# Patient Record
Sex: Female | Born: 1965 | Race: Black or African American | Hispanic: No | Smoking: Never smoker
Health system: Southern US, Community
[De-identification: ages and names within clinical notes are randomized; demographics above are authoritative.]

## PROBLEM LIST (undated history)

## (undated) DIAGNOSIS — E7800 Pure hypercholesterolemia, unspecified: Secondary | ICD-10-CM

## (undated) HISTORY — PX: SHOULDER SURGERY: SHX246

## (undated) HISTORY — DX: Pure hypercholesterolemia, unspecified: E78.00

---

## 2004-11-11 ENCOUNTER — Emergency Department: Payer: Self-pay | Admitting: Emergency Medicine

## 2012-12-29 ENCOUNTER — Ambulatory Visit: Payer: Self-pay | Admitting: Family Medicine

## 2012-12-29 LAB — RAPID STREP-A WITH REFLX: Micro Text Report: NEGATIVE

## 2016-02-02 ENCOUNTER — Emergency Department
Admission: EM | Admit: 2016-02-02 | Discharge: 2016-02-02 | Disposition: A | Discharge status: Home / Self Care | Payer: Self-pay | Attending: Emergency Medicine | Admitting: Emergency Medicine

## 2016-02-02 ENCOUNTER — Encounter: Payer: Self-pay | Admitting: Emergency Medicine

## 2016-02-02 ENCOUNTER — Emergency Department: Payer: Self-pay

## 2016-02-02 DIAGNOSIS — S92511A Displaced fracture of proximal phalanx of right lesser toe(s), initial encounter for closed fracture: Secondary | ICD-10-CM | POA: Insufficient documentation

## 2016-02-02 DIAGNOSIS — Y999 Unspecified external cause status: Secondary | ICD-10-CM | POA: Insufficient documentation

## 2016-02-02 DIAGNOSIS — Z8781 Personal history of (healed) traumatic fracture: Secondary | ICD-10-CM

## 2016-02-02 DIAGNOSIS — W231XXA Caught, crushed, jammed, or pinched between stationary objects, initial encounter: Secondary | ICD-10-CM | POA: Insufficient documentation

## 2016-02-02 DIAGNOSIS — Y939 Activity, unspecified: Secondary | ICD-10-CM | POA: Insufficient documentation

## 2016-02-02 DIAGNOSIS — Y929 Unspecified place or not applicable: Secondary | ICD-10-CM | POA: Insufficient documentation

## 2016-02-02 MED ORDER — NAPROXEN 500 MG PO TABS: 500.0000 mg | ORAL_TABLET | Freq: Two times a day (BID) | ORAL | Status: DC

## 2016-02-02 MED ORDER — TRAMADOL HCL 50 MG PO TABS: 50.0000 mg | ORAL_TABLET | Freq: Four times a day (QID) | ORAL | 0 refills | Status: DC | PRN

## 2016-02-02 NOTE — ED Triage Notes (Signed)
Reports getting her toes stuck in a laundry basket 2 wks ago, pain in right foot since.  Ambulates well.

## 2016-02-02 NOTE — ED Notes (Signed)
See triage note  States she caught her toe in a clothes basket about 2 weeks ago   conts to have pain at right 3rd and 4th toes

## 2016-02-02 NOTE — Discharge Instructions (Signed)
keep toe buddy taped and wear open shoe until evaluation by podiatry.

## 2016-02-02 NOTE — ED Provider Notes (Signed)
[facility] Emergency Department Provider Note   ____________________________________________   First MD Initiated Contact with Patient 02/02/16 [phone]     (approximate)  I have reviewed the triage vital signs and the nursing notes.   HISTORY  Chief Complaint Foot Injury    HPI Brittney Yu is a 50 y.o. female patient complaining of pain and edema to the fourth digit right foot secondary to a contusion 2 weeks ago. Patient did not seek medical care on date of injury. Patient states she is concerned because of continued pain and edema. Patient state mild and transient relief with over-the-counter anti-inflammatory medications. Patient rates the pain as a 7/10. Patient described a pain as "achy".  History reviewed. No pertinent past medical history.  There are no active problems to display for this patient.   History reviewed. No pertinent surgical history.  Prior to Admission medications   Medication Sig Start Date End Date Taking? Authorizing Provider  naproxen (NAPROSYN) 500 MG tablet Take 1 tablet (500 mg total) by mouth 2 (two) times daily with a meal. 02/02/16   Joni Reiningonald K Smith, PA-C  traMADol (ULTRAM) 50 MG tablet Take 1 tablet (50 mg total) by mouth every 6 (six) hours as needed for moderate pain. 02/02/16   Joni Reiningonald K Smith, PA-C    Allergies Patient has no known allergies.  No family history on file.  Social History Social History  Substance Use Topics  . Smoking status: Never Smoker  . Smokeless tobacco: Never Used  . Alcohol use Not on file    Review of Systems Constitutional: No fever/chills Eyes: No visual changes. ENT: No sore throat. Cardiovascular: Denies chest pain. Respiratory: Denies shortness of breath. Gastrointestinal: No abdominal pain.  No nausea, no vomiting.  No diarrhea.  No constipation. Genitourinary: Negative for dysuria. Musculoskeletal: Pain and edema fourth digit right foot. Skin: Negative for  rash. Neurological: Negative for headaches, focal weakness or numbness.  .  ____________________________________________   PHYSICAL EXAM:  VITAL SIGNS: ED Triage Vitals [02/02/16 0836]  Enc Vitals Group     BP (!) 149/71     Pulse Rate 86     Resp 16     Temp 97.9 F (36.6 C)     Temp Source Oral     SpO2 98 %     Weight 200 lb (90.7 kg)     Height 5\' 9"  (1.753 m)     Head Circumference      Peak Flow      Pain Score 7     Pain Loc      Pain Edu?      Excl. in GC?     Constitutional: Alert and oriented. Well appearing and in no acute distress. Eyes: Conjunctivae are normal. PERRL. EOMI. Head: Atraumatic. Nose: No congestion/rhinnorhea. Mouth/Throat: Mucous membranes are moist.  Oropharynx non-erythematous. Neck: No stridor.  No cervical spine tenderness to palpation. Hematological/Lymphatic/Immunilogical: No cervical lymphadenopathy. Cardiovascular: Normal rate, regular rhythm. Grossly normal heart sounds.  Good peripheral circulation. Respiratory: Normal respiratory effort.  No retractions. Lungs CTAB. Gastrointestinal: Soft and nontender. No distention. No abdominal bruits. No CVA tenderness. Musculoskeletal:No obvious deformity to the fourth digit of the right foot. There is mild edema. Patient is moderate guarding palpation of the affected digit. Neurologic:  Normal speech and language. No gross focal neurologic deficits are appreciated. No gait instability. Skin:  Skin is warm, dry and intact. No rash noted. Psychiatric: Mood and affect are normal. Speech and behavior are normal.  ____________________________________________   LABS (all labs ordered are listed, but only abnormal results are displayed)  Labs Reviewed - No data to display ____________________________________________  EKG  ____________________________________________  RADIOLOGY  Fracture proximal phalange fourth digit right  foot. ____________________________________________   PROCEDURES  Procedure(s) performed: None  Procedures  Critical Care performed: No  ____________________________________________   INITIAL IMPRESSION / ASSESSMENT AND PLAN / ED COURSE  Pertinent labs & imaging results that were available during my care of the patient were reviewed by me and considered in my medical decision making (see chart for details).  Fractured toe right foot. Patient given discharge care instructions. Patient advised follow-up with podiatry for definitive evaluation and treatment.  Clinical Course   Discussed x-ray finding with patient. Patient toe was buddy taped place an open shoe. Patient will follow with podiatry.   ____________________________________________   FINAL CLINICAL IMPRESSION(S) / ED DIAGNOSES  Final diagnoses:  History of toe fracture      NEW MEDICATIONS STARTED DURING THIS VISIT:  New Prescriptions   NAPROXEN (NAPROSYN) 500 MG TABLET    Take 1 tablet (500 mg total) by mouth 2 (two) times daily with a meal.   TRAMADOL (ULTRAM) 50 MG TABLET    Take 1 tablet (50 mg total) by mouth every 6 (six) hours as needed for moderate pain.     Note:  This document was prepared using Dragon voice recognition software and may include unintentional dictation errors.    Joni ReiningRonald K Smith, PA-C 02/02/16 [id number]    Nita Sicklearolina Veronese, MD 02/07/16 2322

## 2016-08-29 ENCOUNTER — Ambulatory Visit
Admission: EM | Admit: 2016-08-29 | Discharge: 2016-08-29 | Disposition: A | Discharge status: Home / Self Care | Payer: Self-pay | Attending: Emergency Medicine | Admitting: Emergency Medicine

## 2016-08-29 ENCOUNTER — Encounter: Payer: Self-pay | Admitting: *Deleted

## 2016-08-29 DIAGNOSIS — J0100 Acute maxillary sinusitis, unspecified: Secondary | ICD-10-CM

## 2016-08-29 DIAGNOSIS — J0110 Acute frontal sinusitis, unspecified: Secondary | ICD-10-CM

## 2016-08-29 DIAGNOSIS — R059 Cough, unspecified: Secondary | ICD-10-CM

## 2016-08-29 DIAGNOSIS — R05 Cough: Secondary | ICD-10-CM

## 2016-08-29 MED ORDER — HYDROCOD POLST-CPM POLST ER 10-8 MG/5ML PO SUER: 5.0000 mL | Freq: Every evening | ORAL | 0 refills | Status: DC | PRN

## 2016-08-29 MED ORDER — BENZONATATE 100 MG PO CAPS: 100.0000 mg | ORAL_CAPSULE | Freq: Three times a day (TID) | ORAL | 0 refills | Status: DC | PRN

## 2016-08-29 MED ORDER — DOXYCYCLINE HYCLATE 100 MG PO CAPS: 100.0000 mg | ORAL_CAPSULE | Freq: Two times a day (BID) | ORAL | 0 refills | Status: DC

## 2016-08-29 NOTE — ED Triage Notes (Signed)
Patient started having symptoms of nasal congestion / drainage, left ear pain, and chest congestion 2 months ago. Patient has treated here symptoms with OTC medications with mixed symptom resolution.

## 2016-08-29 NOTE — Discharge Instructions (Signed)
Take medication as prescribed. Rest. Drink plenty of fluids.  ° °Follow up with your primary care physician this week as needed. Return to Urgent care for new or worsening concerns.  ° °

## 2016-08-29 NOTE — ED Provider Notes (Signed)
[facility] ____________________________________________  Time seen: Approximately 3:19 PM  I have reviewed the triage vital signs and the nursing notes.   HISTORY  Chief Complaint Nasal Congestion and Otalgia   HPI Brittney Yu is a 51 y.o. female presenting for evaluation of runny nose, nasal congestion, sinus pressure and intermittent cough that is been present intermittently for the last 2 months. Patient states at times she starts to improve and then symptoms returned back. Reports symptoms have never fully resolved. States she does get some similar symptoms every year around this time frame, question seasonal allergies. Reports symptoms have been unresolved with over-the-counter cough, congestion medications. Denies any Fevers.  Reports cough is daytime and nighttime. States cough does intermittently disrupt her sleep. Denies any wheezing. Reports she has been able to get some thick greenish nasal drainage from nose, but has felt clogged recently. States cough is a dry nonproductive cough. States sinus is still clogged with pressure discomfort to forehead as well as cheeks described as mild to moderate. Denies aggravating or alleviating factors. Denies sore throat. Denies sick contacts. Reports continues to eat and drink well. States sometimes she feels some soreness around her ribs and chest from coughing that is reproducible with direct palpation, but denies chest pain. States no pain unless direct palpated or with movement.  Denies abdominal pain, dysuria, extremity pain, extremity swelling or rash. Denies recent sickness. Denies recent antibiotic use.   Patient's last menstrual period was 08/27/2016. perimenopausal. Denies pregnancy.    History reviewed. No pertinent past medical history. denies There are no active problems to display for this patient.   History reviewed. No pertinent surgical history.   No current facility-administered medications for this  encounter.   Current Outpatient Prescriptions:  none  Allergies Patient has no known allergies.  History reviewed. No pertinent family history.  Social History Social History  Substance Use Topics  . Smoking status: Never Smoker  . Smokeless tobacco: Never Used  . Alcohol use No    Review of Systems Constitutional: No fever/chills Eyes: No visual changes. ENT: No sore throat. As above.  Cardiovascular: Denies chest pain. Respiratory: Denies shortness of breath. Gastrointestinal: No abdominal pain.   Musculoskeletal: Negative for back pain. Skin: Negative for rash.   ____________________________________________   PHYSICAL EXAM:  VITAL SIGNS: ED Triage Vitals  Enc Vitals Group     BP 08/29/16 1418 (!) 127/53     Pulse Rate 08/29/16 1418 65     Resp 08/29/16 1418 16     Temp 08/29/16 1418 98.1 F (36.7 C)     Temp Source 08/29/16 1418 Oral     SpO2 08/29/16 1418 100 %     Weight 08/29/16 1419 200 lb (90.7 kg)     Height 08/29/16 1419 5\' 9"  (1.753 m)     Head Circumference --      Peak Flow --      Pain Score 08/29/16 1420 5     Pain Loc --      Pain Edu? --      Excl. in GC? --    Constitutional: Alert and oriented. Well appearing and in no acute distress. Eyes: Conjunctivae are normal. PERRL. EOMI. Head: Atraumatic.Mild to moderate tenderness to palpation bilateral frontal and maxillary sinuses. No swelling. No erythema.   Ears: no erythema, normal TMs bilaterally.   Nose: nasal congestion with bilateral nasal turbinate erythema and edema.   Mouth/Throat: Mucous membranes are moist.  Oropharynx non-erythematous.No tonsillar swelling or exudate.Drainage present in  posterior pharynx.  Neck: No stridor.  No cervical spine tenderness to palpation. Hematological/Lymphatic/Immunilogical: No cervical lymphadenopathy. Cardiovascular: Normal rate, regular rhythm. Grossly normal heart sounds.  Good peripheral circulation. Respiratory: Normal respiratory effort.  No  retractions.  No wheezes, rales or rhonchi. Good air movement. Occasional dry cough noted. Speaks in complete sentences.  Musculoskeletal:  No cervical, thoracic or lumbar tenderness to palpation. Mild tenderness to palpation along bilateral anterior chest and lateral bilateral ribs, no ecchymosis, no palpable rib fracture, per patient pain is fully reproduced by direct palpation. Neurologic:  Normal speech and language.No gait instability. Skin:  Skin is warm, dry. Psychiatric: Mood and affect are normal. Speech and behavior are normal.   ___________________________________________   LABS (all labs ordered are listed, but only abnormal results are displayed)  Labs Reviewed - No data to display   PROCEDURES Procedures    INITIAL IMPRESSION / ASSESSMENT AND PLAN / ED COURSE  Pertinent labs & imaging results that were available during my care of the patient were reviewed by me and considered in my medical decision making (see chart for details).  Well-appearing patient. No acute distress. Suspect sinusitis with postnasal drainage. Lungs clear throughout, will defer chest x-ray, patient agrees. Will treat patient with oral doxycycline, when necessary Tessalon Perles, when necessary Tussionex. Discussed with patient use of over-the-counter Claritin or Zyrtec. Encourage rest, fluids and supportive care.  Discussed follow up with Primary care physician this week. Discussed follow up and return parameters including no resolution or any worsening concerns. Patient verbalized understanding and agreed to plan.   ____________________________________________   FINAL CLINICAL IMPRESSION(S) / ED DIAGNOSES  Final diagnoses:  Acute frontal sinusitis, recurrence not specified  Acute maxillary sinusitis, recurrence not specified  Cough     Discharge Medication List as of 08/29/2016  3:29 PM    START taking these medications   Details  benzonatate (TESSALON PERLES) 100 MG capsule Take 1  capsule (100 mg total) by mouth 3 (three) times daily as needed for cough., Starting Mon 08/29/2016, Normal    chlorpheniramine-HYDROcodone (TUSSIONEX PENNKINETIC ER) 10-8 MG/5ML SUER Take 5 mLs by mouth at bedtime as needed for cough. do not drive or operate machinery while taking as can cause drowsiness., Starting Mon 08/29/2016, Print    doxycycline (VIBRAMYCIN) 100 MG capsule Take 1 capsule (100 mg total) by mouth 2 (two) times daily., Starting Mon 08/29/2016, Normal        Note: This dictation was prepared with Dragon dictation along with smaller phrase technology. Any transcriptional errors that result from this process are unintentional.         Renford DillsMiller, Ahliya Glatt, NP 08/29/16 1700

## 2017-03-16 ENCOUNTER — Other Ambulatory Visit: Payer: Self-pay

## 2017-03-16 ENCOUNTER — Ambulatory Visit
Admission: EM | Admit: 2017-03-16 | Discharge: 2017-03-16 | Disposition: A | Discharge status: Home / Self Care | Payer: Self-pay | Attending: Family Medicine | Admitting: Family Medicine

## 2017-03-16 ENCOUNTER — Ambulatory Visit (INDEPENDENT_AMBULATORY_CARE_PROVIDER_SITE_OTHER): Payer: Self-pay

## 2017-03-16 ENCOUNTER — Encounter: Payer: Self-pay | Admitting: *Deleted

## 2017-03-16 DIAGNOSIS — R112 Nausea with vomiting, unspecified: Secondary | ICD-10-CM

## 2017-03-16 DIAGNOSIS — J189 Pneumonia, unspecified organism: Secondary | ICD-10-CM

## 2017-03-16 DIAGNOSIS — R0602 Shortness of breath: Secondary | ICD-10-CM

## 2017-03-16 MED ORDER — ONDANSETRON 8 MG PO TBDP: 8.0000 mg | ORAL_TABLET | Freq: Two times a day (BID) | ORAL | 0 refills | Status: DC

## 2017-03-16 MED ORDER — LEVOFLOXACIN 500 MG PO TABS: 500.0000 mg | ORAL_TABLET | Freq: Every day | ORAL | 0 refills | Status: DC

## 2017-03-16 MED ORDER — HYDROCOD POLST-CPM POLST ER 10-8 MG/5ML PO SUER: 5.0000 mL | Freq: Two times a day (BID) | ORAL | 0 refills | Status: DC

## 2017-03-16 NOTE — Discharge Instructions (Signed)
If you worsen go immediately to the emergency room.  You need to establish a primary care physician.  [facility].  You need a follow-up chest x-ray in 3-4 weeks for proof of cure and also to evaluate a nodule on your x-ray.

## 2017-03-16 NOTE — ED Provider Notes (Signed)
[facility]    CSN: [id number] Arrival date & time: 03/16/17  1326     History   Chief Complaint Chief Complaint  Patient presents with  . Otalgia  . Cough    HPI Brittney Yu is a 52 y.o. female.   HPI  52 year old female who presents with symptoms of cough headache bilateral ear pain and chills that started 4 days ago.  He has had nausea vomiting and diarrhea she has not been eating due to the nausea and vomiting and has been only marginally hydrating from her description.  Is coughing up clear  to white mucus.  She states that she has been very fatigued does not remember Monday since she spent all day in bed.  Not improving.       History reviewed. No pertinent past medical history.  There are no active problems to display for this patient.   History reviewed. No pertinent surgical history.  OB History    No data available       Home Medications    Prior to Admission medications   Medication Sig Start Date End Date Taking? Authorizing Provider  chlorpheniramine-HYDROcodone (TUSSIONEX PENNKINETIC ER) 10-8 MG/5ML SUER Take 5 mLs by mouth 2 (two) times daily. 03/16/17   Lutricia Feiloemer, Evanell Redlich P, PA-C  levofloxacin (LEVAQUIN) 500 MG tablet Take 1 tablet (500 mg total) by mouth daily. 03/16/17   Lutricia Feiloemer, Azael Ragain P, PA-C  ondansetron (ZOFRAN ODT) 8 MG disintegrating tablet Take 1 tablet (8 mg total) by mouth 2 (two) times daily. 03/16/17   Lutricia Feiloemer, Alyviah Crandle P, PA-C    Family History History reviewed. No pertinent family history.  Social History Social History   Tobacco Use  . Smoking status: Never Smoker  . Smokeless tobacco: Never Used  Substance Use Topics  . Alcohol use: No  . Drug use: No     Allergies   Patient has no known allergies.   Review of Systems Review of Systems  Constitutional: Positive for activity change, chills and fatigue. Negative for fever.  HENT: Positive for congestion.   Respiratory: Positive for cough and shortness of  breath.   Gastrointestinal: Positive for diarrhea, nausea and vomiting.  All other systems reviewed and are negative.    Physical Exam Triage Vital Signs ED Triage Vitals  Enc Vitals Group     BP 03/16/17 1403 124/67     Pulse Rate 03/16/17 1403 80     Resp 03/16/17 1403 16     Temp 03/16/17 1403 98.6 F (37 C)     Temp Source 03/16/17 1403 Oral     SpO2 03/16/17 1403 95 %     Weight 03/16/17 1405 220 lb (99.8 kg)     Height 03/16/17 1405 5\' 9"  (1.753 m)     Head Circumference --      Peak Flow --      Pain Score 03/16/17 1405 0     Pain Loc --      Pain Edu? --      Excl. in GC? --    No data found.  Updated Vital Signs BP 124/67 (BP Location: Right Arm)   Pulse 80   Temp 98.6 F (37 C) (Oral)   Resp 16   Ht 5\' 9"  (1.753 m)   Wt 220 lb (99.8 kg)   LMP 12/28/2016   SpO2 95%   BMI 32.49 kg/m   Visual Acuity Right Eye Distance:   Left Eye Distance:   Bilateral Distance:  Right Eye Near:   Left Eye Near:    Bilateral Near:     Physical Exam  Constitutional: She is oriented to person, place, and time. She appears well-developed and well-nourished. No distress.  HENT:  Head: Normocephalic.  Right Ear: External ear normal.  Left Ear: External ear normal.  Nose: Nose normal.  Mouth/Throat: Oropharynx is clear and moist. No oropharyngeal exudate.  Eyes: Pupils are equal, round, and reactive to light. Right eye exhibits no discharge. Left eye exhibits no discharge.  Neck: Normal range of motion.  Pulmonary/Chest: Effort normal. She has rales.  He exhibits bilateral lower crackles  Musculoskeletal: Normal range of motion.  Lymphadenopathy:    She has no cervical adenopathy.  Neurological: She is alert and oriented to person, place, and time.  Skin: Skin is warm and dry. She is not diaphoretic.  Psychiatric: She has a normal mood and affect. Her behavior is normal. Judgment and thought content normal.  Nursing note and vitals reviewed.    UC Treatments /  Results  Labs (all labs ordered are listed, but only abnormal results are displayed) Labs Reviewed - No data to display  EKG  EKG Interpretation None       Radiology Dg Chest 2 View  Result Date: 03/16/2017 CLINICAL DATA:  Cough.  Chills. EXAM: CHEST  2 VIEW COMPARISON:  No recent prior. FINDINGS: Mediastinum and hilar structures normal. Mild bibasilar atelectasis and infiltrates. Follow-up chest x-ray to demonstrate clearing suggested . Pulmonary nodule noted projected over the right upper lobe. If this nodular density persists on follow-up chest x-rays chest CT suggested. IMPRESSION: 1. Mild bibasilar atelectasis and infiltrates. Follow-up chest x-rays to demonstrate clearing suggested. 2. Pulmonary nodular density noted projected right upper lung. If this nodular nodular density persists on follow-up chest x-ray is, chest CT suggested . Electronically Signed   By: Maisie Fus  Register   On: 03/16/2017 15:30    Procedures Procedures (including critical care time)  Medications Ordered in UC Medications - No data to display   Initial Impression / Assessment and Plan / UC Course  I have reviewed the triage vital signs and the nursing notes.  Pertinent labs & imaging results that were available during my care of the patient were reviewed by me and considered in my medical decision making (see chart for details).     Plan: 1. Test/x-ray results and diagnosis reviewed with patient 2. rx as per orders; risks, benefits, potential side effects reviewed with patient 3. Recommend supportive treatment with rest and fluids.  Zofran for nausea.  As we discussed you need to follow-up in 3-4 weeks with another x-ray to proof of cure as well as to reevaluate the nodule found in your right lobe. If You worsen you need to go to the emergency room for further evaluation.  Recommend following up at [facility] they can provide primary care medicine to you. 4. F/u prn if symptoms worsen or don't  improve   Final Clinical Impressions(s) / UC Diagnoses   Final diagnoses:  Community acquired pneumonia, unspecified laterality    ED Discharge Orders        Ordered    levofloxacin (LEVAQUIN) 500 MG tablet  Daily     03/16/17 1547    chlorpheniramine-HYDROcodone (TUSSIONEX PENNKINETIC ER) 10-8 MG/5ML SUER  2 times daily     03/16/17 1547    ondansetron (ZOFRAN ODT) 8 MG disintegrating tablet  2 times daily     03/16/17 1555  Controlled Substance Prescriptions [place] Controlled Substance Registry consulted? Not Applicable   Lutricia Feil, PA-C 03/16/17 2102

## 2017-03-16 NOTE — ED Triage Notes (Signed)
Patient started having symptoms of cough, headache, bilateral ear pain and chills 4 days ago.

## 2017-06-19 ENCOUNTER — Ambulatory Visit: Payer: Self-pay | Attending: Oncology | Admitting: *Deleted

## 2017-06-19 ENCOUNTER — Ambulatory Visit
Admission: RE | Admit: 2017-06-19 | Discharge: 2017-06-19 | Disposition: A | Discharge status: Home / Self Care | Payer: Self-pay | Source: Ambulatory Visit | Attending: Oncology | Admitting: Oncology

## 2017-06-19 VITALS — BP 130/78 | HR 71 | Temp 98.5°F | Ht 69.0 in | Wt 237.0 lb

## 2017-06-19 DIAGNOSIS — Z0000 Encounter for general adult medical examination without abnormal findings: Secondary | ICD-10-CM

## 2017-06-19 NOTE — Progress Notes (Signed)
Subjective:     Patient ID: Brittney Yu, female   DOB: 06/22/1965, 52 y.o.   MRN: [id number]  HPI   Review of Systems     Objective:   Physical Exam  Pulmonary/Chest: Right breast exhibits no inverted nipple, no mass, no nipple discharge, no skin change and no tenderness. Left breast exhibits no inverted nipple, no mass, no nipple discharge, no skin change and no tenderness. Breasts are symmetrical.  Very large pendulous breast       Assessment:     52 year old Black female referred to [facility] by [facility] for clinical breast exam and mammogram only.  Last pap on 05/15/17 was negative without HPV co-testing.  Next pap due in 2022.  Patient's dad was diagnosed with breast cancer in his 6070's.  States he did not get genetic testing, but her brother did, and was negative.  States no other family members with cancer.  [facility] gives the patient a lifetime risk of 13.5%, but the model does not take into consideration that her dad had cancer.  Reviewed need for annual screening.  She is agreeable.  Clinical breast exam unremarkable.  Taught self breast awareness.  Patient has been screened for eligibility.  She does not have any insurance, Medicare or Medicaid.  She also meets financial eligibility.  Hand-out given on the Affordable Care Act.    Plan:     Screening mammogram ordered.  Will follow-up per [facility] protocol.

## 2017-06-19 NOTE — Patient Instructions (Signed)
Gave patient hand-out, Women Staying Healthy, Active and Well from [facility], with education on breast health, pap smears, heart and colon health. 

## 2017-06-28 ENCOUNTER — Encounter: Payer: Self-pay | Admitting: *Deleted

## 2017-06-28 NOTE — Progress Notes (Signed)
Letter mailed from the [facility] to inform patient of her normal mammogram results.  Patient is to follow-up with annual screening in one year.  HSIS to Christy. 

## 2018-04-08 ENCOUNTER — Other Ambulatory Visit: Payer: Self-pay

## 2018-04-08 ENCOUNTER — Encounter: Payer: Self-pay | Admitting: Gynecology

## 2018-04-08 ENCOUNTER — Ambulatory Visit
Admission: EM | Admit: 2018-04-08 | Discharge: 2018-04-08 | Disposition: A | Discharge status: Home / Self Care | Payer: Self-pay | Attending: Family Medicine | Admitting: Family Medicine

## 2018-04-08 DIAGNOSIS — J069 Acute upper respiratory infection, unspecified: Secondary | ICD-10-CM | POA: Insufficient documentation

## 2018-04-08 MED ORDER — HYDROCOD POLST-CPM POLST ER 10-8 MG/5ML PO SUER: 5.0000 mL | Freq: Two times a day (BID) | ORAL | 0 refills | Status: DC

## 2018-04-08 MED ORDER — BENZONATATE 200 MG PO CAPS: ORAL_CAPSULE | ORAL | 0 refills | Status: DC

## 2018-04-08 MED ORDER — FLUTICASONE PROPIONATE 50 MCG/ACT NA SUSP: 2.0000 | Freq: Every day | NASAL | 0 refills | Status: DC

## 2018-04-08 NOTE — ED Provider Notes (Signed)
[facility]    CSN: [id number] Arrival date & time: 04/08/18  0915     History   Chief Complaint Chief Complaint  Patient presents with  . Cough    HPI Brittney L Yu is a 53 y.o. female.   HPI  -year-old female presents with a cough that she has had for 5 days.  The cough is been keeping her up at night.  Does have mild nasal congestion but not significant pain.  Over-the-counter medications.  She received her flu shot toward the end of last year.  She has had no fever or chills.         History reviewed. No pertinent past medical history.  There are no active problems to display for this patient.   Past Surgical History:  Procedure Laterality Date  . SHOULDER SURGERY Left     OB History   No obstetric history on file.      Home Medications    Prior to Admission medications   Medication Sig Start Date End Date Taking? Authorizing Provider  benzonatate (TESSALON) 200 MG capsule Take one cap TID PRN cough 04/08/18   Lutricia Feil, PA-C  chlorpheniramine-HYDROcodone [facility]) 10-8 MG/5ML SUER Take 5 mLs by mouth 2 (two) times daily. 04/08/18   Lutricia Feil, PA-C  fluticasone (FLONASE) 50 MCG/ACT nasal spray Place 2 sprays into both nostrils daily. 04/08/18   Lutricia Feil, PA-C    Family History Family History  Problem Relation Age of Onset  . Hypertension Mother   . Breast cancer Father     Social History Social History   Tobacco Use  . Smoking status: Never Smoker  . Smokeless tobacco: Never Used  Substance Use Topics  . Alcohol use: No  . Drug use: No     Allergies   Patient has no known allergies.   Review of Systems Review of Systems  Constitutional: Positive for activity change. Negative for chills, fatigue and fever.  HENT: Positive for congestion.   Respiratory: Positive for cough.   All other systems reviewed and are negative.    Physical Exam Triage Vital Signs ED Triage Vitals    Enc Vitals Group     BP 04/08/18 0927 140/67     Pulse Rate 04/08/18 0927 80     Resp 04/08/18 0927 18     Temp 04/08/18 0927 98.9 F (37.2 C)     Temp Source 04/08/18 0927 Oral     SpO2 04/08/18 0927 100 %     Weight 04/08/18 0928 242 lb (109.8 kg)     Height 04/08/18 0928 5\' 9"  (1.753 m)     Head Circumference --      Peak Flow --      Pain Score 04/08/18 0926 6     Pain Loc --      Pain Edu? --      Excl. in GC? --    No data found.  Updated Vital Signs BP 140/67 (BP Location: Left Arm)   Pulse 80   Temp 98.9 F (37.2 C) (Oral)   Resp 18   Ht 5\' 9"  (1.753 m)   Wt 242 lb (109.8 kg)   SpO2 100%   BMI 35.74 kg/m   Visual Acuity Right Eye Distance:   Left Eye Distance:   Bilateral Distance:    Right Eye Near:   Left Eye Near:    Bilateral Near:     Physical Exam Vitals signs and nursing note  reviewed.  Constitutional:      General: She is not in acute distress.    Appearance: Normal appearance. She is not ill-appearing, toxic-appearing or diaphoretic.  HENT:     Head: Normocephalic.     Right Ear: Tympanic membrane and ear canal normal.     Left Ear: Tympanic membrane and ear canal normal.     Nose: Nose normal.     Mouth/Throat:     Mouth: Mucous membranes are moist.     Pharynx: No oropharyngeal exudate or posterior oropharyngeal erythema.  Eyes:     General:        Right eye: No discharge.        Left eye: No discharge.     Conjunctiva/sclera: Conjunctivae normal.  Neck:     Musculoskeletal: Normal range of motion and neck supple.  Pulmonary:     Effort: Pulmonary effort is normal.     Breath sounds: Normal breath sounds.  Musculoskeletal: Normal range of motion.  Skin:    General: Skin is warm and dry.  Neurological:     General: No focal deficit present.     Mental Status: She is alert and oriented to person, place, and time.  Psychiatric:        Mood and Affect: Mood normal.        Behavior: Behavior normal.        Thought Content:  Thought content normal.        Judgment: Judgment normal.      UC Treatments / Results  Labs (all labs ordered are listed, but only abnormal results are displayed) Labs Reviewed - No data to display  EKG None  Radiology No results found.  Procedures Procedures (including critical care time)  Medications Ordered in UC Medications - No data to display  Initial Impression / Assessment and Plan / UC Course  I have reviewed the triage vital signs and the nursing notes.  Pertinent labs & imaging results that were available during my care of the patient were reviewed by me and considered in my medical decision making (see chart for details).   Has a upper respiratory infection.  We will treat her with cough suppressants and have advised Flonase nasal spray.  If she is not improving she should follow-up with her primary care physician.  Final Clinical Impressions(s) / UC Diagnoses   Final diagnoses:  Upper respiratory tract infection, unspecified type   Discharge Instructions   None    ED Prescriptions    Medication Sig Dispense Auth. Provider   benzonatate (TESSALON) 200 MG capsule Take one cap TID PRN cough 30 capsule Lutricia Feil, PA-C   chlorpheniramine-HYDROcodone (TUSSIONEX PENNKINETIC ER) 10-8 MG/5ML SUER Take 5 mLs by mouth 2 (two) times daily. 115 mL Ovid Curd P, PA-C   fluticasone (FLONASE) 50 MCG/ACT nasal spray Place 2 sprays into both nostrils daily. 16 g Lutricia Feil, PA-C     Controlled Substance Prescriptions [place] Controlled Substance Registry consulted? Not Applicable   Lutricia Feil, PA-C 04/08/18 1010

## 2018-04-08 NOTE — ED Triage Notes (Signed)
Per patient with cough x 5 days. Pt. Stated cough keep her up at night.

## 2018-12-06 IMAGING — CR DG CHEST 2V
2 series · 2 of 2 positions shown · non-contrast
Comparison: No recent prior.

CLINICAL DATA: Cough.  Chills.

EXAM:
CHEST  2 VIEW

[chest pa]
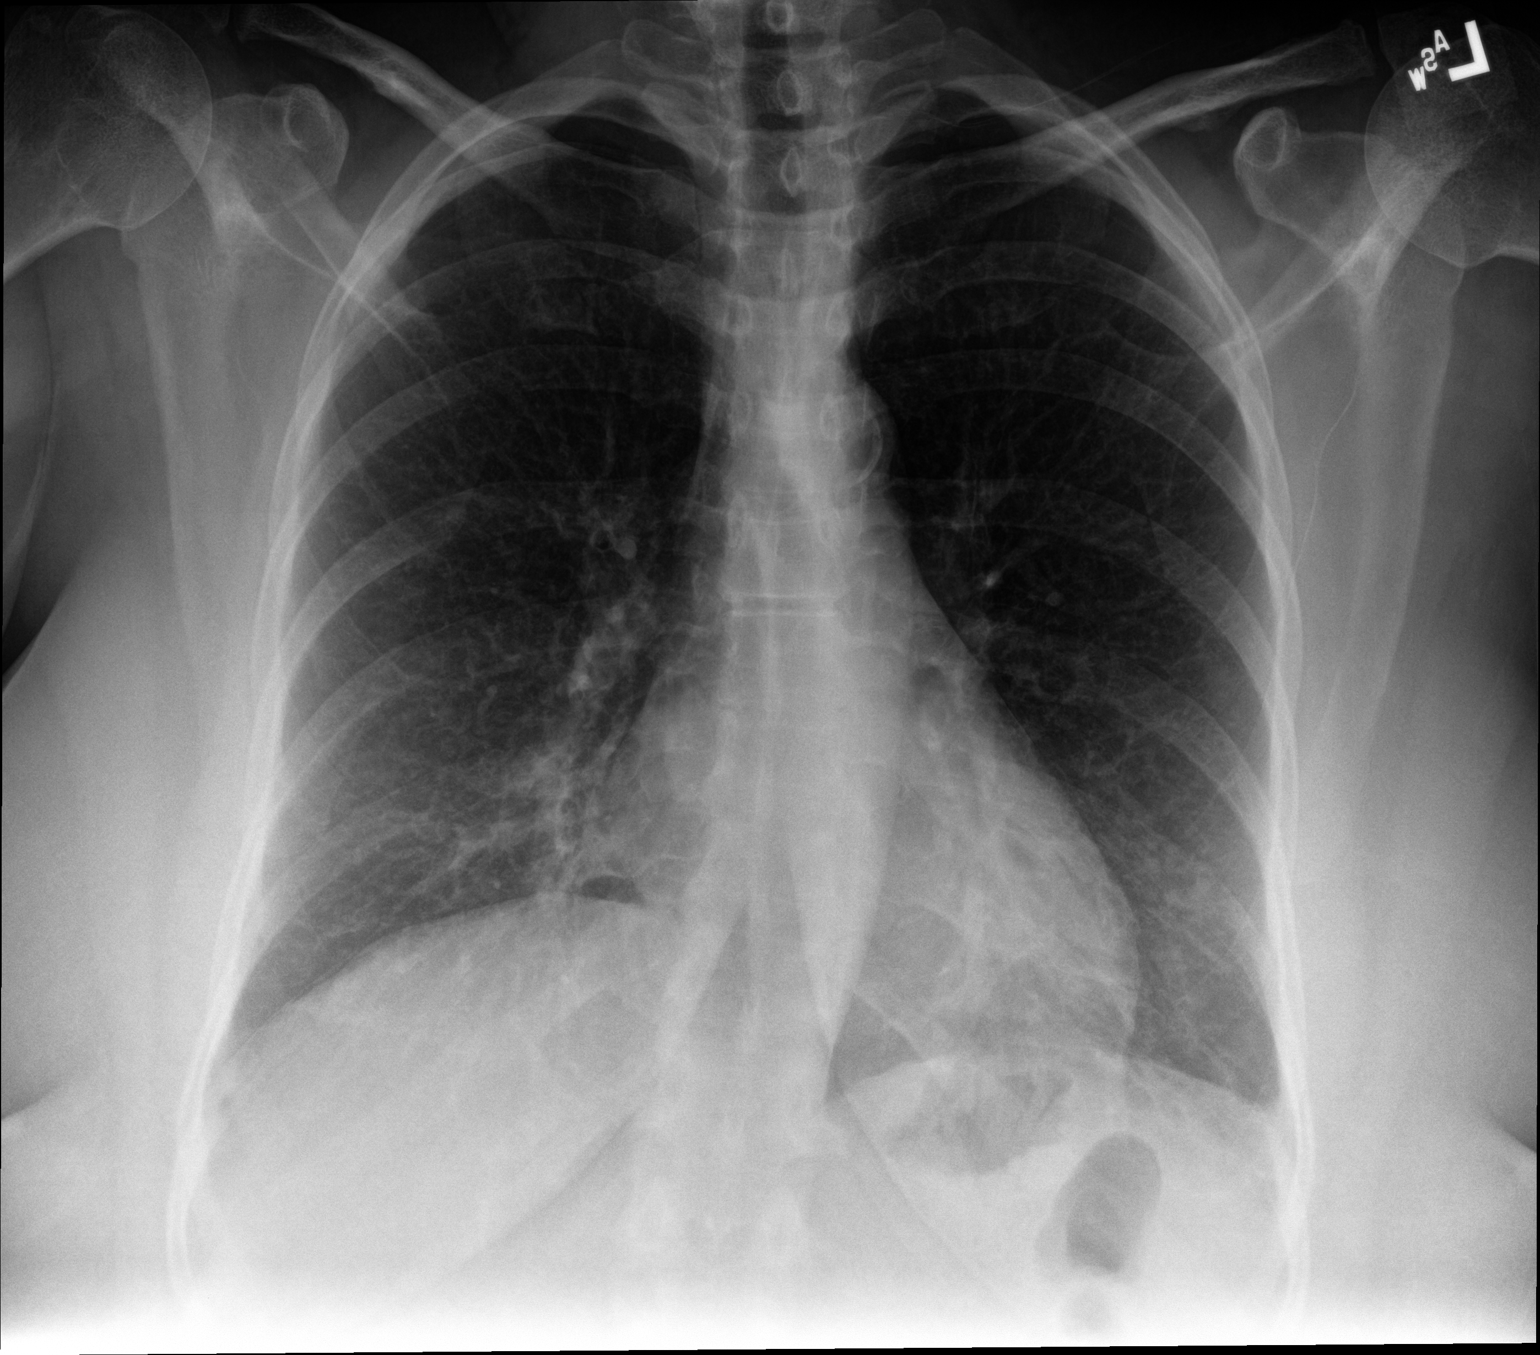

[chest lat]
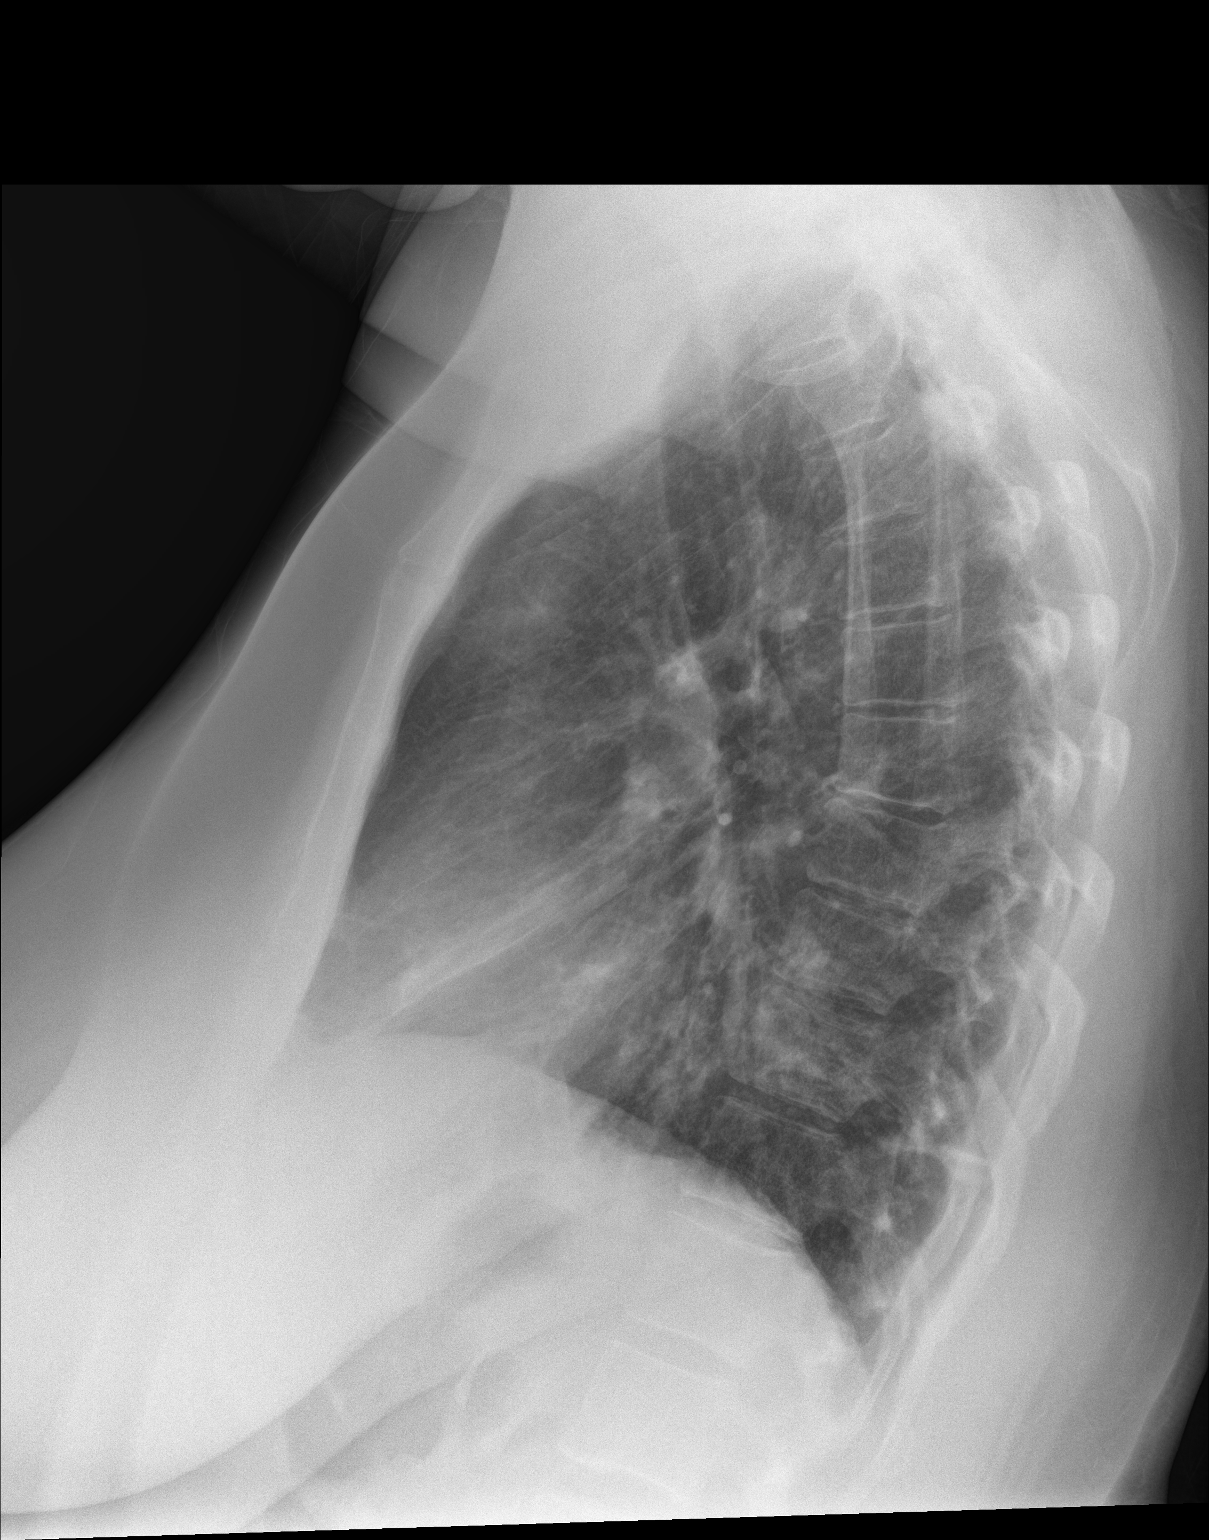

[2 of 2 positions shown; findings below may reference images not displayed]

FINDINGS: Mediastinum and hilar structures normal. Mild bibasilar atelectasis
and infiltrates. Follow-up chest x-ray to demonstrate clearing
suggested . Pulmonary nodule noted projected over the right upper
lobe. If this nodular density persists on follow-up chest x-rays
chest CT suggested.
IMPRESSION: 1. Mild bibasilar atelectasis and infiltrates. Follow-up chest
x-rays to demonstrate clearing suggested.

2. Pulmonary nodular density noted projected right upper lung. If
this nodular nodular density persists on follow-up chest x-ray is,
chest CT suggested .

## 2021-09-17 ENCOUNTER — Encounter: Payer: Self-pay | Admitting: Physician Assistant

## 2021-11-09 ENCOUNTER — Ambulatory Visit: Payer: Self-pay

## 2022-05-12 ENCOUNTER — Other Ambulatory Visit: Payer: Self-pay | Admitting: Family Medicine

## 2022-05-12 DIAGNOSIS — Z1231 Encounter for screening mammogram for malignant neoplasm of breast: Secondary | ICD-10-CM

## 2022-06-01 ENCOUNTER — Ambulatory Visit
Admission: RE | Admit: 2022-06-01 | Discharge: 2022-06-01 | Disposition: A | Discharge status: Home / Self Care | Payer: Medicaid Other | Source: Ambulatory Visit | Attending: Family Medicine | Admitting: Family Medicine

## 2022-06-01 DIAGNOSIS — Z1231 Encounter for screening mammogram for malignant neoplasm of breast: Secondary | ICD-10-CM | POA: Diagnosis present

## 2022-06-03 ENCOUNTER — Encounter: Payer: Self-pay | Admitting: Family Medicine

## 2022-06-06 ENCOUNTER — Encounter: Payer: Self-pay | Admitting: Family Medicine

## 2022-06-06 ENCOUNTER — Other Ambulatory Visit: Payer: Self-pay | Admitting: Family Medicine

## 2022-06-06 DIAGNOSIS — R928 Other abnormal and inconclusive findings on diagnostic imaging of breast: Secondary | ICD-10-CM

## 2022-06-06 DIAGNOSIS — R921 Mammographic calcification found on diagnostic imaging of breast: Secondary | ICD-10-CM

## 2022-06-08 ENCOUNTER — Ambulatory Visit
Admission: RE | Admit: 2022-06-08 | Discharge: 2022-06-08 | Disposition: A | Discharge status: Home / Self Care | Payer: Medicaid Other | Source: Ambulatory Visit | Attending: Family Medicine | Admitting: Family Medicine

## 2022-06-08 DIAGNOSIS — R921 Mammographic calcification found on diagnostic imaging of breast: Secondary | ICD-10-CM | POA: Diagnosis present

## 2022-06-08 DIAGNOSIS — R928 Other abnormal and inconclusive findings on diagnostic imaging of breast: Secondary | ICD-10-CM

## 2022-10-07 ENCOUNTER — Ambulatory Visit
Admission: EM | Admit: 2022-10-07 | Discharge: 2022-10-07 | Disposition: A | Discharge status: Home / Self Care | Payer: Medicaid Other | Attending: Family Medicine | Admitting: Family Medicine

## 2022-10-07 ENCOUNTER — Encounter: Payer: Self-pay | Admitting: *Deleted

## 2022-10-07 ENCOUNTER — Ambulatory Visit: Payer: Medicaid Other

## 2022-10-07 ENCOUNTER — Other Ambulatory Visit: Payer: Self-pay

## 2022-10-07 DIAGNOSIS — M25511 Pain in right shoulder: Secondary | ICD-10-CM | POA: Diagnosis not present

## 2022-10-07 DIAGNOSIS — M19011 Primary osteoarthritis, right shoulder: Secondary | ICD-10-CM | POA: Diagnosis not present

## 2022-10-07 MED ORDER — PREDNISONE 10 MG (21) PO TBPK: ORAL_TABLET | Freq: Every day | ORAL | 0 refills | Status: DC

## 2022-10-07 MED ORDER — NAPROXEN 500 MG PO TABS: 500.0000 mg | ORAL_TABLET | Freq: Two times a day (BID) | ORAL | 0 refills | Status: DC

## 2022-10-07 NOTE — ED Provider Notes (Signed)
[facility]    CSN: [id number] Arrival date & time: 10/07/22  1712      History   Chief Complaint Chief Complaint  Patient presents with   Shoulder Pain    HPI  HPI Brittney Yu is a 57 y.o. female.   Brittney Yu presents for pain in her right shoulder for the past few weeks. Had pain in her left shoulder but this has improved greatly.   Pain keeps her up at night.  She is not able to lay on her shoulder. She had similar pain when her son was younger. Taking ibuprofen with some relief. Last taken last night.   She has a 3yo old grandchild and her son has cerebral palsy and is weight bearing only that she has to lift often. Lifts her son under is arms.      History reviewed. No pertinent past medical history.  Patient Active Problem List   Diagnosis Date Noted   Rotator cuff impingement syndrome, right 10/12/2022    Past Surgical History:  Procedure Laterality Date   SHOULDER SURGERY Left     OB History   No obstetric history on file.      Home Medications    Prior to Admission medications   Medication Sig Start Date End Date Taking? Authorizing Provider  naproxen (NAPROSYN) 500 MG tablet Take 1 tablet (500 mg total) by mouth 2 (two) times daily with a meal. 10/07/22  Yes Grenda Lora, DO  predniSONE (STERAPRED UNI-PAK 21 TAB) 10 MG (21) TBPK tablet Take by mouth daily. Take 6 tabs by mouth daily for 1, then 5 tabs for 1 day, then 4 tabs for 1 day, then 3 tabs for 1 day, then 2 tabs for 1 day, then 1 tab for 1 day. 10/07/22  Yes Rondey Fallen, DO  celecoxib (CELEBREX) 100 MG capsule Take 1 capsule (100 mg total) by mouth 2 (two) times daily as needed. 10/12/22   [name], MD  cyclobenzaprine (FLEXERIL) 10 MG tablet Take 1 tablet (10 mg total) by mouth at bedtime as needed for muscle spasms. 10/12/22   [name], MD  triamcinolone cream (KENALOG) 0.1 % Apply topically 2 (two) times daily. 07/18/22   [provider]    Family  History Family History  Problem Relation Age of Onset   Hypertension Mother    Breast cancer Father     Social History Social History   Tobacco Use   Smoking status: Never   Smokeless tobacco: Never  Substance Use Topics   Alcohol use: No   Drug use: No     Allergies   Patient has no known allergies.   Review of Systems Review of Systems: :negative unless otherwise stated in HPI.      Physical Exam Triage Vital Signs ED Triage Vitals  Encounter Vitals Group     BP 10/07/22 1735 (!) 145/73     Systolic BP Percentile --      Diastolic BP Percentile --      Pulse Rate 10/07/22 1735 90     Resp 10/07/22 1735 18     Temp 10/07/22 1735 98 F (36.7 C)     Temp src --      SpO2 10/07/22 1735 99 %     Weight --      Height --      Head Circumference --      Peak Flow --      Pain Score 10/07/22 1732 8  Pain Loc --      Pain Education --      Exclude from Growth Chart --    No data found.  Updated Vital Signs BP (!) 145/73   Pulse 90   Temp 98 F (36.7 C)   Resp 18   SpO2 99%   Visual Acuity Right Eye Distance:   Left Eye Distance:   Bilateral Distance:    Right Eye Near:   Left Eye Near:    Bilateral Near:     Physical Exam GEN: well appearing female in no acute distress  CVS: well perfused  RESP: speaking in full sentences without pause, no respiratory distress  MSK:  bilateral shoulder:  No evidence of bony deformity, asymmetry, or muscle atrophy. No tenderness over long head of biceps (bicipital groove).  +TTP at [facility] joint.  Decreased ROM of right shoulder 2/2 to pain  good ROM of left  Strength 5/5 grip and elbow and 4/5 shoulder on right and 5/5 on the left. No abnormal scapular function observed.  Special Tests: attempted but pt unable to perform due to acute pain  Sensation intact. Peripheral pulses intact.   UC Treatments / Results  Labs (all labs ordered are listed, but only abnormal results are displayed) Labs Reviewed - No  data to display  EKG   Radiology DG Shoulder Right  Result Date: 10/07/2022 CLINICAL DATA:  Pain for a few weeks. EXAM: RIGHT SHOULDER - 2+ VIEW COMPARISON:  None Available. FINDINGS: There is no evidence of fracture or dislocation. Possible os acromial. Mild acromioclavicular degenerative change. Soft tissues are unremarkable. IMPRESSION: 1. Mild acromioclavicular degenerative change. 2. Possible os acromial. Electronically Signed   By: Narda Rutherford M.D.   On: 10/07/2022 18:41      Procedures Procedures (including critical care time)  Medications Ordered in UC Medications - No data to display  Initial Impression / Assessment and Plan / UC Course  I have reviewed the triage vital signs and the nursing notes.  Pertinent labs & imaging results that were available during my care of the patient were reviewed by me and considered in my medical decision making (see chart for details).      Pt is a 57 y.o.  female with worsening bilateral shoulder pain over the past few weeks. On exam, pt has tenderness at [facility] joint with decreased ROM.     Obtained right shoulder plain films.  Personally interpreted by me were unremarkable for fracture or dislocation. Radiologist report reviewed and additionally notes Possible os acromial which can lead to impingement of her right shoulder.    Patient to gradually return to normal activities, as tolerated and continue ordinary activities within the limits permitted by pain. Prescribed Naproxen sodium  and steroid taper for pain relief.  Tylenol PRN. Advised patient to avoid OTC NSAIDs while taking prescription NSAID. Counseled patient on red flag symptoms and when to seek immediate care.    Patient to follow up with orthopedic provider, if symptoms do not improve with conservative treatment.  Return and ED precautions given. Understanding voiced. Discussed MDM, treatment plan and plan for follow-up with patient who agrees with plan.   Final Clinical  Impressions(s) / UC Diagnoses   Final diagnoses:  Primary osteoarthritis of right shoulder     Discharge Instructions      If medication was prescribed, stop by the pharmacy to pick up your prescriptions.  For your  pain, Take 1000 mg Tylenol 2-3 times a day, take steroids as prescribed,  take Naprosyn twice a day,  as needed for pain.  Rest and elevate the affected painful area.  Apply warm compresses intermittently, as needed.  As pain recedes, begin normal activities slowly as tolerated.    Follow up with Dr Sharman Crate, if pain is not improving.         ED Prescriptions     Medication Sig Dispense Auth. Provider   predniSONE (STERAPRED UNI-PAK 21 TAB) 10 MG (21) TBPK tablet Take by mouth daily. Take 6 tabs by mouth daily for 1, then 5 tabs for 1 day, then 4 tabs for 1 day, then 3 tabs for 1 day, then 2 tabs for 1 day, then 1 tab for 1 day. 21 tablet Tenaya Hilyer, DO   naproxen (NAPROSYN) 500 MG tablet Take 1 tablet (500 mg total) by mouth 2 (two) times daily with a meal. 30 tablet Rhanda Lemire, DO      PDMP not reviewed this encounter.   Katha Cabal, DO 10/16/22 0017

## 2022-10-07 NOTE — ED Triage Notes (Addendum)
Pt reports shoulder pain in both shoulders  worse in Rt  than Lt. 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

## 2022-10-07 NOTE — Discharge Instructions (Addendum)
If medication was prescribed, stop by the pharmacy to pick up your prescriptions.  For your  pain, Take 1000 mg Tylenol 2-3 times a day, take steroids as prescribed, take Naprosyn twice a day,  as needed for pain.  Rest and elevate the affected painful area.  Apply warm compresses intermittently, as needed.  As pain recedes, begin normal activities slowly as tolerated.    Follow up with Dr Sharman Crate, if pain is not improving.

## 2022-10-12 ENCOUNTER — Ambulatory Visit: Payer: Medicaid Other | Admitting: Family Medicine

## 2022-10-12 ENCOUNTER — Encounter: Payer: Self-pay | Admitting: Family Medicine

## 2022-10-12 VITALS — BP 120/82 | HR 68 | Ht 69.0 in | Wt 234.0 lb

## 2022-10-12 DIAGNOSIS — M7541 Impingement syndrome of right shoulder: Secondary | ICD-10-CM

## 2022-10-12 MED ORDER — CYCLOBENZAPRINE HCL 10 MG PO TABS: 10.0000 mg | ORAL_TABLET | Freq: Every evening | ORAL | 0 refills | Status: DC | PRN

## 2022-10-12 MED ORDER — CELECOXIB 100 MG PO CAPS: 100.0000 mg | ORAL_CAPSULE | Freq: Two times a day (BID) | ORAL | 0 refills | Status: DC | PRN

## 2022-10-12 NOTE — Progress Notes (Signed)
     Primary Care / Sports Medicine Office Visit  Patient Information:  Patient ID: Brittney Yu, female DOB: 1966-01-18 Age: 57 y.o. MRN: [id number]   Brittney Yu is a pleasant 57 y.o. female presenting with the following:  Chief Complaint  Patient presents with   Shoulder Pain    Right, 2 weeks, went to UC. Prednisone has helped     Vitals:   10/12/22 0941  BP: 120/82  Pulse: 68  SpO2: 98%   Vitals:   10/12/22 0941  Weight: 234 lb (106.1 kg)  Height: 5\' 9"  (1.753 m)   Body mass index is 34.56 kg/m.     Independent interpretation of notes and tests performed by another provider:   Independent interpretation of right shoulder x-rays dated 10/07/2022 demonstrate subtle AC degenerative changes, no malalignment or acute osseous processes identified  Procedures performed:   None  Pertinent History, Exam, Impression, and Recommendations:   Brittney Yu was seen today for shoulder pain.  Rotator cuff impingement syndrome, right Assessment & Plan: Right-hand-dominant patient presenting with acute on chronic right lateral shoulder pain with radiation to the mid upper arm.  Denies any trauma at onset but did have increased baseline activities (cleaning out a closet, lifting cans of Ensure, etc.).  Additionally, her baseline activity involves taking care of her adult son.  She denies any radiation of symptoms beyond the elbow, no paresthesias, did have an episode of symptoms years prior which resolved with rest alone.  She is experiencing nighttime pain.  Exam with 5/5 rotator cuff strength, symptoms localized to the supraspinatus with positive empty can, positive impingement, minimal AC joint tenderness that does not represent her pain, equivocal biceps tendinitis noted on provocative testing.  Paraspinal right trapezius muscular spasm noted.  Patient's clinical features are consistent with right rotator cuff impingement syndrome with secondary involvement of the biceps tendon  and paraspinal musculature.  Plan as follows: - Finish out prednisone and naproxen (Naprosyn) course - Start home exercises with information provided this weekend - Transition to Celebrex up to twice daily with food for pain - Can use muscle relaxer nightly as-needed (side effect can be drowsiness) - Perform / modify activity as tolerated - Return in 4 weeks - If suboptimal progress noted, can consider ultrasound-guided corticosteroid injection, modification of medications, formal PT, advanced imaging  Orders: -     Celecoxib; Take 1 capsule (100 mg total) by mouth 2 (two) times daily as needed.  Dispense: 60 capsule; Refill: 0 -     Cyclobenzaprine HCl; Take 1 tablet (10 mg total) by mouth at bedtime as needed for muscle spasms.  Dispense: 30 tablet; Refill: 0     Orders & Medications Meds ordered this encounter  Medications   celecoxib (CELEBREX) 100 MG capsule    Sig: Take 1 capsule (100 mg total) by mouth 2 (two) times daily as needed.    Dispense:  60 capsule    Refill:  0   cyclobenzaprine (FLEXERIL) 10 MG tablet    Sig: Take 1 tablet (10 mg total) by mouth at bedtime as needed for muscle spasms.    Dispense:  30 tablet    Refill:  0   No orders of the defined types were placed in this encounter.    No follow-ups on file.     [name], MD, [facility]   Primary Care Sports Medicine Primary Care and Sports Medicine at [facility]

## 2022-10-12 NOTE — Assessment & Plan Note (Signed)
Right-hand-dominant patient presenting with acute on chronic right lateral shoulder pain with radiation to the mid upper arm.  Denies any trauma at onset but did have increased baseline activities (cleaning out a closet, lifting cans of Ensure, etc.).  Additionally, her baseline activity involves taking care of her adult son.  She denies any radiation of symptoms beyond the elbow, no paresthesias, did have an episode of symptoms years prior which resolved with rest alone.  She is experiencing nighttime pain.  Exam with 5/5 rotator cuff strength, symptoms localized to the supraspinatus with positive empty can, positive impingement, minimal AC joint tenderness that does not represent her pain, equivocal biceps tendinitis noted on provocative testing.  Paraspinal right trapezius muscular spasm noted.  Patient's clinical features are consistent with right rotator cuff impingement syndrome with secondary involvement of the biceps tendon and paraspinal musculature.  Plan as follows: - Finish out prednisone and naproxen (Naprosyn) course - Start home exercises with information provided this weekend - Transition to Celebrex up to twice daily with food for pain - Can use muscle relaxer nightly as-needed (side effect can be drowsiness) - Perform / modify activity as tolerated - Return in 4 weeks - If suboptimal progress noted, can consider ultrasound-guided corticosteroid injection, modification of medications, formal PT, advanced imaging

## 2022-10-12 NOTE — Patient Instructions (Signed)
-   Finish out prednisone and naproxen (Naprosyn) course - Start home exercises with information provided this weekend - Transition to Celebrex up to twice daily with food for pain - Can use muscle relaxer nightly as-needed (side effect can be drowsiness) - Perform / modify activity as tolerated - Return in 4 weeks

## 2022-11-09 ENCOUNTER — Encounter: Payer: Self-pay | Admitting: Family Medicine

## 2022-11-09 ENCOUNTER — Ambulatory Visit: Payer: Medicaid Other | Admitting: Family Medicine

## 2022-11-09 VITALS — BP 122/82 | HR 78 | Ht 69.0 in | Wt 234.0 lb

## 2022-11-09 DIAGNOSIS — M7541 Impingement syndrome of right shoulder: Secondary | ICD-10-CM

## 2022-11-09 MED ORDER — PANTOPRAZOLE SODIUM 40 MG PO TBEC: 40.0000 mg | DELAYED_RELEASE_TABLET | Freq: Every day | ORAL | 3 refills | Status: DC | PRN

## 2022-11-11 NOTE — Assessment & Plan Note (Signed)
Returns for follow-up to right shoulder pain with RC impingement. Did not get a chance to try Celebrex that was prescribed in an effort to mitigate GI disturbance from naproxen.  Exam today demonstrates improved RC strength, pain with ER, +impingement noted  Discussed options: - Trial Celebrex - Start home exercises - Return as-needed - Can consider CSI

## 2022-11-11 NOTE — Patient Instructions (Signed)
-   Trial Celebrex - Start home exercises - Return as-needed

## 2022-11-11 NOTE — Progress Notes (Signed)
     Primary Care / Sports Medicine Office Visit  Patient Information:  Patient ID: Brittney Yu, female DOB: [birth year] Age: 57 y.o. MRN: [id number]   Brittney Yu is a pleasant 57 y.o. female presenting with the following:  Chief Complaint  Patient presents with   Rotator cuff impingement syndrome, right    Little better, has not been doing exercises as much, hasn't continued the medicaiton naproxen upset stomach, hasn't really taking celebrex,     Vitals:   11/09/22 0858  BP: 122/82  Pulse: 78  SpO2: 97%   Vitals:   11/09/22 0858  Weight: 234 lb (106.1 kg)  Height: 5\' 9"  (1.753 m)   Body mass index is 34.56 kg/m.  No results found.   Independent interpretation of notes and tests performed by another provider:   None  Procedures performed:   None  Pertinent History, Exam, Impression, and Recommendations:   [name] was seen today for rotator cuff impingement syndrome, right.  Rotator cuff impingement syndrome, right Assessment & Plan: Returns for follow-up to right shoulder pain with RC impingement. Did not get a chance to try Celebrex that was prescribed in an effort to mitigate GI disturbance from naproxen.  Exam today demonstrates improved RC strength, pain with ER, +impingement noted  Discussed options: - Trial Celebrex - Start home exercises - Return as-needed - Can consider CSI    Other orders -     Pantoprazole Sodium; Take 1 tablet (40 mg total) by mouth daily as needed (stomach pain). Take on empty stomach 30 minutes prior to breakfast.  Dispense: 30 tablet; Refill: 3     Orders & Medications Meds ordered this encounter  Medications   pantoprazole (PROTONIX) 40 MG tablet    Sig: Take 1 tablet (40 mg total) by mouth daily as needed (stomach pain). Take on empty stomach 30 minutes prior to breakfast.    Dispense:  30 tablet    Refill:  3   No orders of the defined types were placed in this encounter.    No follow-ups on file.      [name], MD, [facility]   Primary Care Sports Medicine Primary Care and Sports Medicine at [facility]

## 2023-02-12 ENCOUNTER — Encounter: Payer: Self-pay | Admitting: Emergency Medicine

## 2023-02-12 ENCOUNTER — Ambulatory Visit: Payer: Medicaid Other

## 2023-02-12 ENCOUNTER — Ambulatory Visit
Admission: EM | Admit: 2023-02-12 | Discharge: 2023-02-12 | Disposition: A | Discharge status: Home / Self Care | Payer: Medicaid Other | Attending: Emergency Medicine | Admitting: Emergency Medicine

## 2023-02-12 DIAGNOSIS — S73101A Unspecified sprain of right hip, initial encounter: Secondary | ICD-10-CM | POA: Diagnosis not present

## 2023-02-12 MED ORDER — BACLOFEN 10 MG PO TABS: 10.0000 mg | ORAL_TABLET | Freq: Three times a day (TID) | ORAL | 0 refills | Status: DC

## 2023-02-12 MED ORDER — IBUPROFEN 600 MG PO TABS: 600.0000 mg | ORAL_TABLET | Freq: Four times a day (QID) | ORAL | 0 refills | Status: DC | PRN

## 2023-02-12 NOTE — ED Provider Notes (Signed)
[facility]    CSN: [id number] Arrival date & time: 02/12/23  2706      History   Chief Complaint Chief Complaint  Patient presents with   Groin Pain    right    HPI Brittney Yu is a 57 y.o. female.   HPI  57 year old female with a past medical history significant for rotator cuff impingement presents for evaluation of 3 days worth of pain in the front of her right hip.  This pain intensifies with ambulation and weightbearing, though patient does admit having pain when she tried to sleep last night, especially laying on her right side.  She denies any swelling or injury.  History reviewed. No pertinent past medical history.  Patient Active Problem List   Diagnosis Date Noted   Rotator cuff impingement syndrome, right 10/12/2022    Past Surgical History:  Procedure Laterality Date   SHOULDER SURGERY Left     OB History   No obstetric history on file.      Home Medications    Prior to Admission medications   Medication Sig Start Date End Date Taking? Authorizing Provider  baclofen (LIORESAL) 10 MG tablet Take 1 tablet (10 mg total) by mouth 3 (three) times daily. 02/12/23  Yes Becky Augusta, NP  ibuprofen (ADVIL) 600 MG tablet Take 1 tablet (600 mg total) by mouth every 6 (six) hours as needed. 02/12/23  Yes Becky Augusta, NP  pantoprazole (PROTONIX) 40 MG tablet Take 1 tablet (40 mg total) by mouth daily as needed (stomach pain). Take on empty stomach 30 minutes prior to breakfast. 11/09/22   [name], MD  triamcinolone cream (KENALOG) 0.1 % Apply topically 2 (two) times daily. 07/18/22   [provider]    Family History Family History  Problem Relation Age of Onset   Hypertension Mother    Breast cancer Father     Social History Social History   Tobacco Use   Smoking status: Never   Smokeless tobacco: Never  Vaping Use   Vaping status: Never Used  Substance Use Topics   Alcohol use: No   Drug use: No     Allergies    Patient has no known allergies.   Review of Systems Review of Systems  Musculoskeletal:  Positive for arthralgias. Negative for joint swelling.  Skin:  Negative for color change.     Physical Exam Triage Vital Signs ED Triage Vitals  Encounter Vitals Group     BP 02/12/23 1002 (!) 162/86     Systolic BP Percentile --      Diastolic BP Percentile --      Pulse Rate 02/12/23 1002 70     Resp 02/12/23 1002 15     Temp 02/12/23 1002 98.4 F (36.9 C)     Temp Source 02/12/23 1002 Oral     SpO2 02/12/23 1002 96 %     Weight 02/12/23 1001 233 lb 14.5 oz (106.1 kg)     Height 02/12/23 1001 5\' 9"  (1.753 m)     Head Circumference --      Peak Flow --      Pain Score 02/12/23 1000 5     Pain Loc --      Pain Education --      Exclude from Growth Chart --    No data found.  Updated Vital Signs BP (!) 162/86 (BP Location: Right Arm)   Pulse 70   Temp 98.4 F (36.9 C) (Oral)   Resp  15   Ht 5\' 9"  (1.753 m)   Wt 233 lb 14.5 oz (106.1 kg)   LMP 12/28/2016   SpO2 96%   BMI 34.54 kg/m   Visual Acuity Right Eye Distance:   Left Eye Distance:   Bilateral Distance:    Right Eye Near:   Left Eye Near:    Bilateral Near:     Physical Exam Vitals and nursing note reviewed.  Constitutional:      Appearance: Normal appearance. She is not ill-appearing.  HENT:     Head: Normocephalic and atraumatic.  Musculoskeletal:        General: Tenderness present. No swelling or signs of injury.  Skin:    General: Skin is warm and dry.     Capillary Refill: Capillary refill takes less than 2 seconds.  Neurological:     General: No focal deficit present.     Mental Status: She is alert and oriented to person, place, and time.      UC Treatments / Results  Labs (all labs ordered are listed, but only abnormal results are displayed) Labs Reviewed - No data to display  EKG   Radiology DG Hip Unilat W or Wo Pelvis 2-3 Views Right  Result Date: 02/12/2023 CLINICAL DATA:  Right  anterior hip and groin pain for 3 days. EXAM: DG HIP (WITH OR WITHOUT PELVIS) 2-3V RIGHT COMPARISON:  None Available. FINDINGS: There is no evidence of hip fracture or dislocation. There is no evidence of arthropathy or other focal bone abnormality. The bony pelvis demonstrates no fracture, diastasis or lesion. IMPRESSION: Negative. Electronically Signed   By: Irish Lack M.D.   On: 02/12/2023 11:08    Procedures Procedures (including critical care time)  Medications Ordered in UC Medications - No data to display  Initial Impression / Assessment and Plan / UC Course  I have reviewed the triage vital signs and the nursing notes.  Pertinent labs & imaging results that were available during my care of the patient were reviewed by me and considered in my medical decision making (see chart for details).   Patient is a pleasant, nontoxic-appearing 57 year old female presenting for evaluation of right hip pain x 3 days as outlined in HPI above.  On exam patient has no tenderness with palpation of the greater trochanter, gluteus complex, or with palpation of the quadriceps complex or adductor muscles.  She does have pain with direct palpation of the anterior aspect of the right hip joint.  Given that she has increased pain with ambulation and range of motion of her hip there is concern for possible osteoarthritis versus hip impingement versus labral tear.  I will obtain radiographs to evaluate for any evidence of degenerative changes.  Right hip films independently reviewed and evaluated by me.  Impression: No evidence of fracture or dislocation.  Minimal degenerative changes noted bilaterally.  Radiology overread is pending. Radiology impression states no evidence of hip fracture or dislocation.  No evidence of arthropathy or focal bone abnormality.  I will discharge patient home with a diagnosis of right hip strain and treat her with ibuprofen 600 mg every 6 hours as needed for pain along with  baclofen 10 mg every 8 hours as needed for muscle pain or tension.  If her symptoms do not improve, or they worsen, she should follow-up with orthopedics for further evaluation and imaging.   Final Clinical Impressions(s) / UC Diagnoses   Final diagnoses:  Hip sprain, right, initial encounter     Discharge Instructions  Your x-rays did not demonstrate any evidence of arthritis in your hip and also no evidence of any fractures or dislocations.  I believe your pain is coming from the soft tissue surrounding your hip joint.  This could be a sprain of one of the ligaments or it could possibly be a labral tear.  Use the ibuprofen 600 mg every 6 hours with food to help with pain and inflammation.  Take the baclofen 10 mg every 8 hours to help with muscle pain and tension.  Rest your right hip is much as possible.  You may apply ice to your right hip for 20 minutes at a time, 2-3 times a day, as needed for pain or inflammation.  If your symptoms do not improve, or your pain worsens, I recommend that you follow-up with orthopedics as you may need advanced imaging that cannot be performed at our urgent care.  [facility] has a walk-in urgent care Monday through Saturday here in [place] and 7 days a week in [place].     ED Prescriptions     Medication Sig Dispense Auth. Provider   ibuprofen (ADVIL) 600 MG tablet Take 1 tablet (600 mg total) by mouth every 6 (six) hours as needed. 30 tablet Becky Augusta, NP   baclofen (LIORESAL) 10 MG tablet Take 1 tablet (10 mg total) by mouth 3 (three) times daily. 30 each Becky Augusta, NP      PDMP not reviewed this encounter.   Becky Augusta, NP 02/12/23 1119

## 2023-02-12 NOTE — ED Triage Notes (Signed)
Patient c/o right sided groin pain that started on Thanksgiving Day.  Patient denies injury or fall.  Patient reports the pain comes and goes.  Patient states that walking makes it worse.

## 2023-02-12 NOTE — Discharge Instructions (Addendum)
Your x-rays did not demonstrate any evidence of arthritis in your hip and also no evidence of any fractures or dislocations.  I believe your pain is coming from the soft tissue surrounding your hip joint.  This could be a sprain of one of the ligaments or it could possibly be a labral tear.  Use the ibuprofen 600 mg every 6 hours with food to help with pain and inflammation.  Take the baclofen 10 mg every 8 hours to help with muscle pain and tension.  Rest your right hip is much as possible.  You may apply ice to your right hip for 20 minutes at a time, 2-3 times a day, as needed for pain or inflammation.  If your symptoms do not improve, or your pain worsens, I recommend that you follow-up with orthopedics as you may need advanced imaging that cannot be performed at our urgent care.  [facility] has a walk-in urgent care Monday through Saturday here in [place] and 7 days a week in [place].

## 2023-04-12 ENCOUNTER — Emergency Department: Payer: Medicaid Other

## 2023-04-12 ENCOUNTER — Other Ambulatory Visit: Payer: Self-pay

## 2023-04-12 ENCOUNTER — Inpatient Hospital Stay
Admission: EM | Admit: 2023-04-12 | Discharge: 2023-04-14 | DRG: 066 | Disposition: A | Discharge status: Rehabilitation Facility | Payer: Medicaid Other | Attending: Hospitalist | Admitting: Hospitalist

## 2023-04-12 ENCOUNTER — Inpatient Hospital Stay: Payer: Medicaid Other

## 2023-04-12 DIAGNOSIS — E66811 Obesity, class 1: Secondary | ICD-10-CM | POA: Diagnosis present

## 2023-04-12 DIAGNOSIS — R531 Weakness: Secondary | ICD-10-CM | POA: Diagnosis present

## 2023-04-12 DIAGNOSIS — I63211 Cerebral infarction due to unspecified occlusion or stenosis of right vertebral arteries: Principal | ICD-10-CM | POA: Diagnosis present

## 2023-04-12 DIAGNOSIS — R270 Ataxia, unspecified: Secondary | ICD-10-CM | POA: Diagnosis present

## 2023-04-12 DIAGNOSIS — I6389 Other cerebral infarction: Secondary | ICD-10-CM | POA: Diagnosis not present

## 2023-04-12 DIAGNOSIS — Z7902 Long term (current) use of antithrombotics/antiplatelets: Secondary | ICD-10-CM

## 2023-04-12 DIAGNOSIS — R42 Dizziness and giddiness: Principal | ICD-10-CM

## 2023-04-12 DIAGNOSIS — Z7982 Long term (current) use of aspirin: Secondary | ICD-10-CM

## 2023-04-12 DIAGNOSIS — Z6833 Body mass index (BMI) 33.0-33.9, adult: Secondary | ICD-10-CM

## 2023-04-12 DIAGNOSIS — I639 Cerebral infarction, unspecified: Secondary | ICD-10-CM | POA: Diagnosis present

## 2023-04-12 DIAGNOSIS — Z8249 Family history of ischemic heart disease and other diseases of the circulatory system: Secondary | ICD-10-CM

## 2023-04-12 DIAGNOSIS — Z803 Family history of malignant neoplasm of breast: Secondary | ICD-10-CM | POA: Diagnosis not present

## 2023-04-12 DIAGNOSIS — R29701 NIHSS score 1: Secondary | ICD-10-CM | POA: Diagnosis present

## 2023-04-12 LAB — BASIC METABOLIC PANEL
Anion gap: 14 (ref 5–15)
BUN: 18 mg/dL (ref 6–20)
CO2: 23 mmol/L (ref 22–32)
Calcium: 8.8 mg/dL — ABNORMAL LOW (ref 8.9–10.3)
Chloride: 104 mmol/L (ref 98–111)
Creatinine, Ser: 0.7 mg/dL (ref 0.44–1.00)
GFR, Estimated: >60 mL/min (ref >60)
Glucose, Bld: 178 mg/dL — ABNORMAL HIGH (ref 70–99)
Potassium: 3.7 mmol/L (ref 3.5–5.1)
Sodium: 141 mmol/L (ref 135–145)

## 2023-04-12 LAB — LIPID PANEL
Cholesterol: 201 mg/dL — ABNORMAL HIGH (ref 0–200)
HDL: 79 mg/dL (ref >40)
LDL Cholesterol: 113 mg/dL — ABNORMAL HIGH (ref 0–99)
Total CHOL/HDL Ratio: 2.5 {ratio}
Triglycerides: 46 mg/dL (ref <150)
VLDL: 9 mg/dL (ref 0–40)

## 2023-04-12 LAB — CBC WITH DIFFERENTIAL/PLATELET
Abs Immature Granulocytes: 0.02 10*3/uL (ref 0.00–0.07)
Basophils Absolute: 0 10*3/uL (ref 0.0–0.1)
Basophils Relative: 1 %
Eosinophils Absolute: 0.1 10*3/uL (ref 0.0–0.5)
Eosinophils Relative: 2 %
HCT: 39.2 % (ref 36.0–46.0)
Hemoglobin: 12.5 g/dL (ref 12.0–15.0)
Immature Granulocytes: 0 %
Lymphocytes Relative: 27 %
Lymphs Abs: 1.7 10*3/uL (ref 0.7–4.0)
MCH: 27.8 pg (ref 26.0–34.0)
MCHC: 31.9 g/dL (ref 30.0–36.0)
MCV: 87.3 fL (ref 80.0–100.0)
Monocytes Absolute: 0.6 10*3/uL (ref 0.1–1.0)
Monocytes Relative: 9 %
Neutro Abs: 3.7 10*3/uL (ref 1.7–7.7)
Neutrophils Relative %: 61 %
Platelets: 319 10*3/uL (ref 150–400)
RBC: 4.49 MIL/uL (ref 3.87–5.11)
RDW: 14.4 % (ref 11.5–15.5)
WBC: 6.1 10*3/uL (ref 4.0–10.5)
nRBC: 0 % (ref 0.0–0.2)

## 2023-04-12 LAB — HEMOGLOBIN A1C
Hgb A1c MFr Bld: 5.7 % — ABNORMAL HIGH (ref 4.8–5.6)
Mean Plasma Glucose: 116.89 mg/dL

## 2023-04-12 LAB — TROPONIN I (HIGH SENSITIVITY)
Troponin I (High Sensitivity): 3 ng/L (ref <18)
Troponin I (High Sensitivity): 3 ng/L (ref <18)

## 2023-04-12 LAB — TSH: TSH: 1.1 u[IU]/mL (ref 0.350–4.500)

## 2023-04-12 LAB — CBG MONITORING, ED: Glucose-Capillary: 165 mg/dL — ABNORMAL HIGH (ref 70–99)

## 2023-04-12 MED ORDER — MECLIZINE HCL 25 MG PO TABS
25.0000 mg | ORAL_TABLET | Freq: Three times a day (TID) | ORAL | Status: DC | PRN
  Administered 2023-04-12 – 2023-04-14 (×3): 25 mg via ORAL
  Filled 2023-04-12 (×5): qty 1

## 2023-04-12 MED ORDER — CLOPIDOGREL BISULFATE 75 MG PO TABS
300.0000 mg | ORAL_TABLET | Freq: Once | ORAL | Status: AC
  Administered 2023-04-12: 300 mg via ORAL
  Filled 2023-04-12: qty 4

## 2023-04-12 MED ORDER — ONDANSETRON HCL 4 MG/2ML IJ SOLN
4.0000 mg | Freq: Once | INTRAMUSCULAR | Status: AC
  Administered 2023-04-12: 4 mg via INTRAVENOUS
  Filled 2023-04-12: qty 2

## 2023-04-12 MED ORDER — ENOXAPARIN SODIUM 40 MG/0.4ML IJ SOSY
40.0000 mg | PREFILLED_SYRINGE | INTRAMUSCULAR | Status: DC
  Administered 2023-04-12 – 2023-04-14 (×3): 40 mg via SUBCUTANEOUS
  Filled 2023-04-12 (×3): qty 0.4

## 2023-04-12 MED ORDER — CLOPIDOGREL BISULFATE 75 MG PO TABS
75.0000 mg | ORAL_TABLET | Freq: Every day | ORAL | Status: DC
  Administered 2023-04-13 – 2023-04-14 (×2): 75 mg via ORAL
  Filled 2023-04-12 (×2): qty 1

## 2023-04-12 MED ORDER — ASPIRIN 81 MG PO TBEC
81.0000 mg | DELAYED_RELEASE_TABLET | Freq: Every day | ORAL | Status: DC
  Administered 2023-04-12 – 2023-04-14 (×3): 81 mg via ORAL
  Filled 2023-04-12 (×3): qty 1

## 2023-04-12 MED ORDER — ONDANSETRON HCL 4 MG/2ML IJ SOLN
4.0000 mg | Freq: Four times a day (QID) | INTRAMUSCULAR | Status: DC | PRN
  Administered 2023-04-12 (×2): 4 mg via INTRAVENOUS
  Filled 2023-04-12 (×2): qty 2

## 2023-04-12 MED ORDER — MECLIZINE HCL 25 MG PO TABS
25.0000 mg | ORAL_TABLET | Freq: Once | ORAL | Status: AC
  Administered 2023-04-12: 25 mg via ORAL
  Filled 2023-04-12: qty 1

## 2023-04-12 MED ORDER — DIAZEPAM 5 MG PO TABS
5.0000 mg | ORAL_TABLET | Freq: Once | ORAL | Status: AC
  Administered 2023-04-12: 5 mg via ORAL
  Filled 2023-04-12: qty 1

## 2023-04-12 MED ORDER — IOHEXOL 350 MG/ML SOLN
75.0000 mL | Freq: Once | INTRAVENOUS | Status: AC | PRN
  Administered 2023-04-12: 75 mL via INTRAVENOUS

## 2023-04-12 MED ORDER — STROKE: EARLY STAGES OF RECOVERY BOOK: Freq: Once | Status: AC

## 2023-04-12 NOTE — Evaluation (Signed)
Occupational Therapy Evaluation Patient Details Name: Brittney Yu MRN: [id number] DOB: 06-10-1965 Today's Date: 04/12/2023   History of Present Illness Pt is a 58 yo female that presented to the ED for weakness and dizziness, and R sided HA with vomiting. 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   Clinical Impression   Chart reviewed to date, neurologist cleared pt for activity with therapy. Pt is alert and oriented x4, agreeable to OT evaluation. Co tx with PT on this date. PTA pt is indep in ADL/IADL, amb with no AD. 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 Pt is limited on this date by nausea and dizziness throughout. CGA-MIN A required for bed mobility, STS with MOD A +2, step pivot transfer to bsc with MOD A +2, back from bsc to bed MOD A +1. MAX A required for toileting and LB dressing. R lateral lean noted during all static sitting tasks. Mild FMC/dexterity deficits noted in R hand- will continue to assess. Pt vision also appears impaired-please refer to vision section and PT note. Pt will benefit from acute OT to address deficits and to facilitate optimal return to PLOF.  Pt is left as received, all needs met, vomiting with nurse aware. OT will follow acutely.       If plan is discharge home, recommend the following: A lot of help with walking and/or transfers;Assistance with cooking/housework;A lot of help with bathing/dressing/bathroom;Assist for transportation;Help with stairs or ramp for entrance    Functional Status Assessment  Patient has had a recent decline in their functional status and demonstrates the ability to make significant improvements in function in a reasonable and predictable amount of time.  Equipment Recommendations  Other (comment) (defer to next venue of care)    Recommendations for Other Services       Precautions / Restrictions Precautions Precautions: Fall Restrictions Weight Bearing Restrictions  Per Provider Order: No      Mobility Bed Mobility Overal bed mobility: Needs Assistance Bed Mobility: Supine to Sit, Sit to Supine     Supine to sit: Min assist, Used rails, HOB elevated Sit to supine: Contact guard assist        Transfers Overall transfer level: Needs assistance Equipment used: 2 person hand held assist, 1 person hand held assist Transfers: Sit to/from Stand Sit to Stand: Mod assist, +2 physical assistance                  Balance Overall balance assessment: Needs assistance Sitting-balance support: Feet supported, Single extremity supported Sitting balance-Leahy Scale: Fair Sitting balance - Comments: R lateral lean with pt able to self correct with verbal and tactile cues   Standing balance support: Bilateral upper extremity supported Standing balance-Leahy Scale: Poor                             ADL either performed or assessed with clinical judgement   ADL Overall ADL's : Needs assistance/impaired Eating/Feeding: Supervision/ safety;Sitting   Grooming: Wash/dry face;Sitting;Supervision/safety               Lower Body Dressing: Maximal assistance Lower Body Dressing Details (indicate cue type and reason): CGA to doff one sock, limited by nausea; MAX A to doff other sock and to donn grip socks Toilet Transfer: Moderate assistance;+2 for physical assistance;+2 for safety/equipment Toilet Transfer Details (indicate cue type and reason): step pivot to bsc +2 transfer to  bsc, +1 transfer back Toileting- Clothing Manipulation and Hygiene: Maximal assistance;Sit to/from stand Toileting - Clothing Manipulation Details (indicate cue type and reason): continent urine on toilet       General ADL Comments: limited by n/v on this date     Vision Patient Visual Report: Eye fatigue/eye pain/headache Vision Assessment?: Yes Eye Alignment: Impaired (comment) Ocular Range of Motion: Within Functional Limits Tracking/Visual Pursuits:  Able to track stimulus in all quads without difficulty Saccades:  (deferred) Visual Fields: No apparent deficits Additional Comments: Assessment with PT: End range nystagmus bilaterally, and with cover test of R eye, R eye and L eye both drift to R. R eyelid appears to have ptosis.     Perception Perception: Impaired Preception Impairment Details: Spatial orientation     Praxis         Pertinent Vitals/Pain Pain Assessment Pain Assessment: No/denies pain     Extremity/Trunk Assessment Upper Extremity Assessment Upper Extremity Assessment: Right hand dominant;RUE deficits/detail RUE Deficits / Details: mild deficits in R hand FMC/dexterity, will continue to assess; A/PROM grossly WFL, sensation appears grossly WFL however will continue toassess RUE Coordination: decreased fine motor   Lower Extremity Assessment Lower Extremity Assessment: Defer to PT evaluation   Cervical / Trunk Assessment Cervical / Trunk Assessment: Normal   Communication Communication Communication: No apparent difficulties   Cognition Arousal: Alert Behavior During Therapy: WFL for tasks assessed/performed, Flat affect Overall Cognitive Status: Within Functional Limits for tasks assessed                                       General Comments  vss monitored, appear stable throughout; pt reports feeling light headed after toilet transfer, BP WNL    Exercises Other Exercises Other Exercises: edu pt and neice re: role of OT, role of rehab, discharge recommendations, safe ADL completion   Shoulder Instructions      Home Living Family/patient expects to be discharged to:: Private residence Living Arrangements: Children (adult son who has special needs (25) adult daugther and grand daugther) Available Help at Discharge: Family;Available 24 hours/day Type of Home: Mobile home Home Access: Stairs to enter Entrance Stairs-Number of Steps: 5, ramp Entrance Stairs-Rails: Can reach  both Home Layout: One level     Bathroom Shower/Tub: Producer, television/film/video: Handicapped height Bathroom Accessibility: Yes How Accessible: Accessible via walker Home Equipment: None          Prior Functioning/Environment Prior Level of Function : Independent/Modified Independent;Working/employed;Driving             Mobility Comments: amb no AD home/community distances ADLs Comments: assists adult son with CP        OT Problem List: Decreased activity tolerance;Decreased strength;Impaired balance (sitting and/or standing);Impaired vision/perception;Impaired UE functional use;Decreased knowledge of use of DME or AE;Decreased coordination      OT Treatment/Interventions: Self-care/ADL training;DME and/or AE instruction;Therapeutic exercise;Therapeutic activities;Balance training;Neuromuscular education;Modalities;Visual/perceptual remediation/compensation;Energy conservation;Patient/family education    OT Goals(Current goals can be found in the care plan section) Acute Rehab OT Goals Patient Stated Goal: go home OT Goal Formulation: With patient Time For Goal Achievement: 04/26/23 Potential to Achieve Goals: Good  OT Frequency: Min 1X/week    Co-evaluation   Reason for Co-Treatment: To address functional/ADL transfers PT goals addressed during session: Mobility/safety with mobility;Balance OT goals addressed during session: ADL's and self-care      AM-PAC OT "6 Clicks" Daily Activity  Outcome Measure Help from another person eating meals?: None Help from another person taking care of personal grooming?: None Help from another person toileting, which includes using toliet, bedpan, or urinal?: A Lot Help from another person bathing (including washing, rinsing, drying)?: A Lot Help from another person to put on and taking off regular upper body clothing?: A Little Help from another person to put on and taking off regular lower body clothing?: A Lot 6  Click Score: 17   End of Session Equipment Utilized During Treatment: Gait belt Nurse Communication: Mobility status  Activity Tolerance: Other (comment) (limited by n/v) Patient left: in bed;with call bell/phone within reach;with family/visitor present  OT Visit Diagnosis: Other abnormalities of gait and mobility (R26.89);Unsteadiness on feet (R26.81);Other symptoms and signs involving the nervous system (R29.898);Dizziness and giddiness (R42)                Time: 1610-9604 OT Time Calculation (min): 34 min Charges:  OT General Charges $OT Visit: 1 Visit OT Evaluation $OT Eval Moderate Complexity: 1 Mod Oleta Mouse, OTD OTR/L  04/12/23, 4:12 PM

## 2023-04-12 NOTE — Consult Note (Signed)
NEUROLOGY CONSULT NOTE   Date of service: April 12, 2023 Patient Name: Brittney Yu MRN:  [id number] DOB:  Jun 09, 1965 Chief Complaint: "Vertigo" Requesting Provider: Darlin Priestly, MD  History of Present Illness  Brittney Yu is a 58 y.o. female with no past medical history who presents with vertigo that started last night around midnight.  She states that it has been persistent since that time.  She noticed some tingling last night of bilateral hands and feet, but no numbness or weakness otherwise.  She does not notice any changes in her vision, but has trouble keeping her eyes open because of the nausea.  Due to the symptoms, she had an MRI which demonstrated acute infarcts in the PICA distribution.  As part of her workup she received a CTA which demonstrated an occluded right vertebral.  She denies any pain, any recent neck injury, or other concerning factors for possible dissection.  LKW: 1/28 Modified rankin score: 0-Completely asymptomatic and back to baseline post- stroke IV Thrombolysis: No, mild symptoms EVT: No   NIHSS components Score: Comment  1a Level of Conscious 0[x]  1[]  2[]  3[]      1b LOC Questions 0[x]  1[]  2[]       1c LOC Commands 0[x]  1[]  2[]       2 Best Gaze 0[x]  1[]  2[]       3 Visual 0[x]  1[]  2[]  3[]      4 Facial Palsy 0[x]  1[]  2[]  3[]      5a Motor Arm - left 0[x]  1[]  2[]  3[]  4[]  UN[]    5b Motor Arm - Right 0[x]  1[]  2[]  3[]  4[]  UN[]    6a Motor Leg - Left 0[x]  1[]  2[]  3[]  4[]  UN[]    6b Motor Leg - Right 0[x]  1[]  2[]  3[]  4[]  UN[]    7 Limb Ataxia 0[]  1[x]  2[]  3[]  UN[]     8 Sensory 0[x]  1[]  2[]  UN[]      9 Best Language 0[x]  1[]  2[]  3[]      10 Dysarthria 0[x]  1[]  2[]  UN[]      11 Extinct. and Inattention 0[x]  1[]  2[]       TOTAL: 1      Past History  History reviewed. No pertinent past medical history.  Past Surgical History:  Procedure Laterality Date   SHOULDER SURGERY Left     Family History: Family History  Problem Relation Age of Onset    Hypertension Mother    Breast cancer Father     Social History  reports that she has never smoked. She has never used smokeless tobacco. She reports that she does not drink alcohol and does not use drugs.  No Known Allergies  Medications   Current Facility-Administered Medications:    [START ON 04/13/2023]  stroke: early stages of recovery book, , Does not apply, Once, Darlin Priestly, MD   enoxaparin (LOVENOX) injection 40 mg, 40 mg, Subcutaneous, Q24H, Darlin Priestly, MD, 40 mg at 04/12/23 1105   meclizine (ANTIVERT) tablet 25 mg, 25 mg, Oral, TID PRN, Darlin Priestly, MD, 25 mg at 04/12/23 1107   ondansetron [facility]) injection 4 mg, 4 mg, Intravenous, Q6H PRN, Darlin Priestly, MD, 4 mg at 04/12/23 1211  Current Outpatient Medications:    baclofen (LIORESAL) 10 MG tablet, Take 1 tablet (10 mg total) by mouth 3 (three) times daily. (Patient not taking: Reported on 04/12/2023), Disp: 30 each, Rfl: 0   ibuprofen (ADVIL) 600 MG tablet, Take 1 tablet (600 mg total) by mouth every 6 (six) hours as needed. (Patient not taking: Reported on 04/12/2023), Disp: 30  tablet, Rfl: 0   pantoprazole (PROTONIX) 40 MG tablet, Take 1 tablet (40 mg total) by mouth daily as needed (stomach pain). Take on empty stomach 30 minutes prior to breakfast. (Patient not taking: Reported on 2023-04-18), Disp: 30 tablet, Rfl: 3   triamcinolone cream (KENALOG) 0.1 %, Apply topically 2 (two) times daily. (Patient not taking: Reported on 04-18-2023), Disp: , Rfl:   Vitals   Vitals:   04/18/2023 1445 04/18/23 1500 April 18, 2023 1515 04/18/2023 1530  BP:  134/63  126/62  Pulse: (!) 102  64 (!) 59  Resp:      Temp:      TempSrc:      SpO2: 98%  96% 97%  Weight:      Height:        Body mass index is 33.37 kg/m.  Physical Exam   Constitutional: Appears well-developed and well-nourished.  Neurologic Examination    Neuro: Mental Status: Patient is awake, alert, oriented to person, place, month, year, and situation. Patient is able to give a  clear and coherent history. No signs of aphasia or neglect Cranial Nerves: II: Visual Fields are full. Pupils are equal, round, and reactive to light.   III,IV, VI: EOMI without ptosis or diploplia.  V: Facial sensation is symmetric to temperature VII: Facial movement is symmetric.  VIII: hearing is intact to voice X: Uvula elevates symmetrically XII: tongue is midline without atrophy or fasciculations.  Motor: Tone is normal. Bulk is normal. 5/5 strength was present in all four extremities.  Sensory: Sensation is symmetric to light touch and temperature in the arms and legs. Cerebellar: She has very mild ataxia in the right arm, I question if there may be some in her leg as well, but does not definite.        Labs/Imaging/Neurodiagnostic studies   CBC:  Recent Labs  Lab 2023/04/18 0135  WBC 6.1  NEUTROABS 3.7  HGB 12.5  HCT 39.2  MCV 87.3  PLT 319   Basic Metabolic Panel:  Lab Results  Component Value Date   NA 141 2023-04-18   K 3.7 04/18/23   CO2 23 04-18-2023   GLUCOSE 178 (H) 04-18-2023   BUN 18 04/18/23   CREATININE 0.70 2023-04-18   CALCIUM 8.8 (L) 04/18/2023   GFRNONAA >60 2023-04-18   Lipid Panel:  Lab Results  Component Value Date   LDLCALC 113 (H) 2023/04/18   HgbA1c:  Lab Results  Component Value Date   HGBA1C 5.7 (H) Apr 18, 2023   Urine Drug Screen: No results found for: "LABOPIA", "COCAINSCRNUR", "LABBENZ", "AMPHETMU", "THCU", "LABBARB"  Alcohol Level No results found for: "ETH" INR No results found for: "INR" APTT No results found for: "APTT" AED levels: No results found for: "PHENYTOIN", "ZONISAMIDE", "LAMOTRIGINE", "LEVETIRACETA"   MRI Brain(Personally reviewed): Cerebellar and small lateral medullary infarct  ASSESSMENT   Brittney Yu is a 58 y.o. female with no known past medical history who presents with an occluded right vertebral.  She has no symptoms to suggest dissection.  She will need workup for secondary risk factor  modification as well as physical therapy.  RECOMMENDATIONS  - HgbA1c, fasting lipid panel - Frequent neuro checks - Echocardiogram - Prophylactic therapy-Antiplatelet med: Aspirin - dose 81mg  and plavix 75mg  daily  after 300mg  load  - Risk factor modification - Telemetry monitoring - PT consult, OT consult, Speech consult   ______________________________________________________________________    Signed, Ritta Slot, MD [facility]

## 2023-04-12 NOTE — ED Provider Notes (Signed)
[facility] Provider Note    Event Date/Time   First MD Initiated Contact with Patient 04/12/23 0117     (approximate)   History   Weakness   HPI  Brittney Yu is a 58 y.o. female  who presents to the emergency department today because of concerns for weakness and dizziness.  Patient states symptoms started suddenly around midnight.  This was accompanied by some right sided headache and an episode of vomiting. Patient states that her weakness is in all of her extremities, also felt some numbness to bilateral arms.  Patient denies similar symptoms in the past.  Denies any recent illness.      Physical Exam   Triage Vital Signs: ED Triage Vitals  Encounter Vitals Group     BP 112/62     Systolic BP Percentile      Diastolic BP Percentile      Pulse 53     Resp 15     Temp 97.8     Temp src      SpO2 98     Weight      Height      Head Circumference      Peak Flow      Pain Score      Pain Loc      Pain Education      Exclude from Growth Chart     Most recent vital signs: There were no vitals filed for this visit.  General: Awake, alert, oriented. CV:  Good peripheral perfusion. Regular rate and rhythm. Resp:  Normal effort. Lungs clear. Abd:  No distention.  Other:  PERRL. EOMI. Face symmetric. Tongue midline. Strength 5/5 in upper and lower extremities. Sensation grossly intact.   ED Results / Procedures / Treatments   Labs (all labs ordered are listed, but only abnormal results are displayed) Labs Reviewed  BASIC METABOLIC PANEL - Abnormal; Notable for the following components:      Result Value   Glucose, Bld 178 (*)    Calcium 8.8 (*)    All other components within normal limits  CBG MONITORING, ED - Abnormal; Notable for the following components:   Glucose-Capillary 165 (*)    All other components within normal limits  CBC WITH DIFFERENTIAL/PLATELET  TROPONIN I (HIGH SENSITIVITY)  TROPONIN I (HIGH SENSITIVITY)      EKG  I, Phineas Semen, attending physician, personally viewed and interpreted this EKG  EKG Time: 0127 Rate: 56 Rhythm: atrial bradycardia Axis: right axis deviation Intervals: qtc 432 QRS: narrow, q waves v1 ST changes: no st elevation Impression: abnormal ekg    RADIOLOGY I independently interpreted and visualized the CT head. My interpretation: No ICH Radiology interpretation:  IMPRESSION:  No evidence of acute intracranial abnormality.    MR brain IMPRESSION:  Small acute infarcts in the right lateral medulla and right  cerebellum. Abnormal right vertebral flow void, recommend CTA of the  head and neck.    PROCEDURES:  Critical Care performed: No    MEDICATIONS ORDERED IN ED: Medications  meclizine (ANTIVERT) tablet 25 mg (has no administration in time range)     IMPRESSION / MDM / ASSESSMENT AND PLAN / ED COURSE  I reviewed the triage vital signs and the nursing notes.                              Differential diagnosis includes, but is not limited to, vertigo, anemia,  electrolyte abnormality, arrhythmia, CVA  Patient's presentation is most consistent with acute presentation with potential threat to life or bodily function.    Patient presented to the emergency department today because of concern for acute onset of dizziness.  Patient did have some nausea and one episode of vomiting as well as diffuse weakness. On exam patient awake and alert. No focal neurodeficits on my exam.  Ordered head CT as well as blood work.  Will trial meclizine.  Head CT negative.  Patient did not get significant relief with meclizine so was given Valium.  She stated this did improve her dizziness.  Did think likely vertigo however did obtain an MRI in case it did show an acute stroke.  MRI was positive for acute CVA.  I discussed this with the patient.  Discussed with Dr. Arville Care with the hospitalist service who will evaluate for admission.   FINAL CLINICAL  IMPRESSION(S) / ED DIAGNOSES   Final diagnoses:  Dizziness  Cerebrovascular accident (CVA), unspecified mechanism (HCC)      Note:  This document was prepared using Dragon voice recognition software and may include unintentional dictation errors.    Phineas Semen, MD 04/12/23 [phone]

## 2023-04-12 NOTE — ED Notes (Signed)
Dr. Derrill Kay to bedside, at this time NO CODE STROKE

## 2023-04-12 NOTE — ED Notes (Signed)
Niece Miya provided contact info for pt daughter Elmarie Mainland [phone] and Burnis Kingfisher [phone]

## 2023-04-12 NOTE — ED Notes (Addendum)
MD Fran Lowes asked this RN to relay to patient's family that they are completing a stroke work up on patient, and that neurology will make plans for the pt. Physical therapy is recommending inpatient rehab for pt. Pt's family family voiced understanding. MD Fran Lowes notified of the communication of message.

## 2023-04-12 NOTE — ED Notes (Signed)
Patient's family is at the bedside requesting to speak with provider about the patient's plan of care. MD Fran Lowes notified via secure chat.

## 2023-04-12 NOTE — ED Notes (Signed)
Pt return from MRI, NAD Noted, reports nausea and dizziness has improved very minimal w/valium

## 2023-04-12 NOTE — ED Triage Notes (Addendum)
Pt arrives via [facility] from home with CC of sudden onset of weakness/dizziness. LKW midnight. EMS reports pt unsteady with ambulation and leaning to R side. Single episode of emesis. Grips equal bilaterally. Pt A&O at this time. EMS started IV and gave zofran.

## 2023-04-12 NOTE — H&P (Addendum)
History and Physical    Brittney Yu:[id number] DOB: Apr 26, 1965 DOA: 04/12/2023  PCP: Hillery Aldo, MD  Patient coming from: home  I have personally briefly reviewed patient's old medical records in [facility]  Chief Complaint: dizziness  HPI: Brittney Yu is a 58 y.o. female with no significant PMH who presented with sudden onset dizziness and weakness.  Pt reported suddenly became dizzy (with room spinning) last night.  Dizziness led to nausea and vomiting, and difficulty walking.  Prior to the event, pt was at her baseline health.  No prior hx of stroke, DM.     ED Course: initial vitals: afebrile, pulse 53, BP 112/62, RR 15, sating 96% on room air.  Labs unremarkable.  MRI brain showed Small acute infarcts in the right lateral medulla and right cerebellum.  Pt admitted for stroke workup.   Assessment/Plan  # Acute stroke --presented with symptoms of vertigo and unsteady gait.  MRI brain showed Small acute infarcts in the right lateral medulla and right cerebellum. Plan: --CTA head/neck --Echo --lipid profile, TSH and A1c --tele monitor --neuro consult --PT/OT  # Nausea and dizziness  --pt has vertigo likely from her stroke. --Meclizine PRN   DVT prophylaxis: Lovenox SQ Code Status: Full code  Family Communication: niece updated at bedside on admission  Disposition Plan: to be determined  Consults called: neurology Level of care: Telemetry Medical   Review of Systems: As per HPI otherwise complete review of systems negative.   History reviewed. No pertinent past medical history.  Past Surgical History:  Procedure Laterality Date   SHOULDER SURGERY Left      reports that she has never smoked. She has never used smokeless tobacco. She reports that she does not drink alcohol and does not use drugs.  No Known Allergies  Family History  Problem Relation Age of Onset   Hypertension Mother    Breast cancer Father     Prior to Admission  medications   Medication Sig Start Date End Date Taking? Authorizing Provider  baclofen (LIORESAL) 10 MG tablet Take 1 tablet (10 mg total) by mouth 3 (three) times daily. Patient not taking: Reported on 04/12/2023 02/12/23   Becky Augusta, NP  ibuprofen (ADVIL) 600 MG tablet Take 1 tablet (600 mg total) by mouth every 6 (six) hours as needed. Patient not taking: Reported on 04/12/2023 02/12/23   Becky Augusta, NP  pantoprazole (PROTONIX) 40 MG tablet Take 1 tablet (40 mg total) by mouth daily as needed (stomach pain). Take on empty stomach 30 minutes prior to breakfast. Patient not taking: Reported on 04/12/2023 11/09/22   [name], MD  triamcinolone cream (KENALOG) 0.1 % Apply topically 2 (two) times daily. Patient not taking: Reported on 04/12/2023 07/18/22   [provider]    Physical Exam: Vitals:   04/12/23 0510 04/12/23 0526 04/12/23 0530 04/12/23 0600  BP: 114/67  (!) 112/55 105/61  Pulse: (!) 54  (!) 54 (!) 58  Resp: 16   16  Temp:  (!) 97.5 F (36.4 C)    TempSrc:  Oral    SpO2: 96%  96% 98%  Weight:      Height:        Constitutional: NAD, AAOx3, keeping eyes closed CV: No cyanosis.   RESP: normal respiratory effort, on RA Extremities: No effusions, edema in BLE SKIN: warm, dry Psych: Normal mood and affect.  Appropriate judgement and reason   Labs on Admission: I have personally reviewed labs and imaging studies  Time spent: 55 minutes  Darlin Priestly MD [facility]  If 7PM-7AM, please contact night-coverage 04/12/2023, 9:52 AM

## 2023-04-12 NOTE — Progress Notes (Signed)
Inpatient Rehab Admissions Coordinator:  ? ?Per therapy recommendations,  patient was screened for CIR candidacy by Megan Salon, MS, CCC-SLP. At this time, Pt. Appears to be a a potential candidate for CIR. I will place   order for rehab consult per protocol for full assessment. Please contact me any with questions. ? ?Megan Salon, MS, CCC-SLP ?Rehab Admissions Coordinator  ?[phone] (celll) ?[phone] (office) ? ?

## 2023-04-12 NOTE — ED Notes (Signed)
Patient transported to MRI

## 2023-04-12 NOTE — Evaluation (Signed)
Physical Therapy Evaluation Patient Details Name: Brittney Yu MRN: [id number] DOB: 1965-09-11 Today's Date: 04/12/2023  History of Present Illness  Pt is a 58 yo female that presented to the ED for weakness and dizziness, and R sided HA with vomiting. 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   Clinical Impression  Pt A&Ox4, reported no dizziness at start of session, increased to 4/10 with standing, and pt did dry heave/vomit at end of session due to symptoms. Pt reported at baseline she is independent and cares for her disabled son.   She was able to move BLE against gravity, denied light touch sensation changes. Questionable gross motor coordination deficits, needs further assessment. She was able to perform supine to sit minA. Noted for intermittent R lean in sitting, but able to correct to ~80% midline (pt able to sense R lean). Pt noted to have end range nystagmus bilaterally, and with cover test of R eye, R eye and L eye both drift to R. R eyelid appears to have some element of ptosis. Peripheral vision and tracking WFLs.   Sit <> stand several times initially modAx2 decreasing to modA to stand pivot to [facility]. Deferred true walking today due to pt symptoms but able to take a few steps forwards/backward,s with min-modAx2 handheld assist, posterior lean noted.  Overall the patient demonstrated deficits (see "PT Problem List") that impede the patient's functional abilities, safety, and mobility and would benefit from skilled PT intervention.        If plan is discharge home, recommend the following: A lot of help with walking and/or transfers;A little help with bathing/dressing/bathroom;Assistance with cooking/housework;Assist for transportation;Help with stairs or ramp for entrance   Can travel by private vehicle        Equipment Recommendations Other (comment) (TBD)  Recommendations for Other Services       Functional Status Assessment  Patient has had a recent decline in their functional status and demonstrates the ability to make significant improvements in function in a reasonable and predictable amount of time.     Precautions / Restrictions Precautions Precautions: Fall Restrictions Weight Bearing Restrictions Per Provider Order: No      Mobility  Bed Mobility Overal bed mobility: Needs Assistance Bed Mobility: Supine to Sit, Sit to Supine     Supine to sit: Min assist, Used rails, HOB elevated Sit to supine: Contact guard assist        Transfers Overall transfer level: Needs assistance Equipment used: 2 person hand held assist, 1 person hand held assist Transfers: Sit to/from Stand Sit to Stand: Mod assist, +2 physical assistance           General transfer comment: initially min-modAx2 to stand, posterior lean noted. progressed to sit <> stand modA, one handheld assist    Ambulation/Gait   Gait Distance (Feet): 1 Feet Assistive device: 2 person hand held assist         General Gait Details: able to minimally step forwards/backwards but challenged by dizziness, posterior lean  Stairs            Wheelchair Mobility     Tilt Bed    Modified Rankin (Stroke Patients Only)       Balance Overall balance assessment: Needs assistance Sitting-balance support: Feet supported, Single extremity supported Sitting balance-Leahy Scale: Fair Sitting balance - Comments: R lateral lean noted intermittently but pt aware and able to self correct 80%. tactile cues for true midline   Standing balance support:  Bilateral upper extremity supported Standing balance-Leahy Scale: Poor                               Pertinent Vitals/Pain Pain Assessment Pain Assessment: No/denies pain    Home Living Family/patient expects to be discharged to:: Private residence Living Arrangements: Children (adult son who has special needs (25) adult daugther and grand daugther) Available Help at  Discharge: Family;Available 24 hours/day Type of Home: Mobile home Home Access: Stairs to enter Entrance Stairs-Rails: Can reach both Entrance Stairs-Number of Steps: 5, ramp   Home Layout: One level Home Equipment: None      Prior Function Prior Level of Function : Independent/Modified Independent;Working/employed;Driving               ADLs Comments: assists adult son with CP     Extremity/Trunk Assessment   Upper Extremity Assessment Upper Extremity Assessment: Defer to OT evaluation    Lower Extremity Assessment Lower Extremity Assessment:  (able to move BLE freely against gravity, question gross motor coordination, needs more assessment. sensation intact)    Cervical / Trunk Assessment Cervical / Trunk Assessment: Normal  Communication      Cognition Arousal: Alert Behavior During Therapy: WFL for tasks assessed/performed Overall Cognitive Status: Within Functional Limits for tasks assessed                                          General Comments      Exercises     Assessment/Plan    PT Assessment Patient needs continued PT services  PT Problem List Decreased strength;Decreased activity tolerance;Decreased balance;Decreased mobility;Decreased coordination;Decreased knowledge of precautions       PT Treatment Interventions DME instruction;Balance training;Gait training;Neuromuscular re-education;Stair training;Functional mobility training;Patient/family education;Therapeutic activities;Therapeutic exercise    PT Goals (Current goals can be found in the Care Plan section)  Acute Rehab PT Goals Patient Stated Goal: to feel better, get back to normal PT Goal Formulation: With patient Time For Goal Achievement: 04/26/23 Potential to Achieve Goals: Good    Frequency Min 1X/week     Co-evaluation PT/OT/SLP Co-Evaluation/Treatment: Yes Reason for Co-Treatment: To address functional/ADL transfers PT goals addressed during session:  Mobility/safety with mobility;Balance OT goals addressed during session: ADL's and self-care       AM-PAC PT "6 Clicks" Mobility  Outcome Measure Help needed turning from your back to your side while in a flat bed without using bedrails?: A Little Help needed moving from lying on your back to sitting on the side of a flat bed without using bedrails?: A Little Help needed moving to and from a bed to a chair (including a wheelchair)?: A Little Help needed standing up from a chair using your arms (e.g., wheelchair or bedside chair)?: A Little Help needed to walk in hospital room?: A Lot Help needed climbing 3-5 steps with a railing? : A Lot 6 Click Score: 16    End of Session Equipment Utilized During Treatment: Gait belt Activity Tolerance: Other (comment) (limited by dizziness, nausea) Patient left: in bed;with call bell/phone within reach;with family/visitor present Nurse Communication: Mobility status;Other (comment) (nausea) PT Visit Diagnosis: Other abnormalities of gait and mobility (R26.89);Difficulty in walking, not elsewhere classified (R26.2);Muscle weakness (generalized) (M62.81)    Time: 1610-9604 PT Time Calculation (min) (ACUTE ONLY): 35 min   Charges:   PT Evaluation $PT Eval Low  Complexity: 1 Low PT Treatments $Therapeutic Activity: 8-22 mins PT General Charges $$ ACUTE PT VISIT: 1 Visit         Olga Coaster PT, DPT 3:22 PM,04/12/23

## 2023-04-12 NOTE — ED Notes (Signed)
Provided water, pt is swallowing without difficulty

## 2023-04-12 NOTE — ED Notes (Signed)
Patient transported to CT

## 2023-04-13 ENCOUNTER — Inpatient Hospital Stay (HOSPITAL_COMMUNITY)
Admit: 2023-04-13 | Discharge: 2023-04-13 | Disposition: A | Discharge status: Home / Self Care | Payer: Medicaid Other | Attending: Hospitalist | Admitting: Hospitalist

## 2023-04-13 DIAGNOSIS — I6389 Other cerebral infarction: Secondary | ICD-10-CM

## 2023-04-13 DIAGNOSIS — I639 Cerebral infarction, unspecified: Secondary | ICD-10-CM | POA: Diagnosis not present

## 2023-04-13 LAB — ECHOCARDIOGRAM COMPLETE
AR max vel: 2.26 cm2
AV Area VTI: 2.5 cm2
AV Area mean vel: 2.27 cm2
AV Mean grad: 8 mm[Hg]
AV Peak grad: 17.1 mm[Hg]
Ao pk vel: 2.07 m/s
Area-P 1/2: 4.65 cm2
Calc EF: 73 %
Height: 69.5 in
MV VTI: 2.74 cm2
S' Lateral: 2.1 cm
Single Plane A2C EF: 72.7 %
Single Plane A4C EF: 73.4 %
Weight: 3703.73 [oz_av]

## 2023-04-13 LAB — HIV ANTIBODY (ROUTINE TESTING W REFLEX): HIV Screen 4th Generation wRfx: NONREACTIVE

## 2023-04-13 MED ORDER — ATORVASTATIN CALCIUM 20 MG PO TABS
80.0000 mg | ORAL_TABLET | Freq: Every day | ORAL | Status: DC
  Administered 2023-04-13 – 2023-04-14 (×2): 80 mg via ORAL
  Filled 2023-04-13: qty 4

## 2023-04-13 NOTE — Progress Notes (Signed)
Physical Medicine & Rehabilitation Consult Service  Pt discussed with rehab admissions coordinator. Chart has been reviewed. This is a 58 yo female with vertigo and weakness d/t acute infarcts in the right lateral medulla and cerebellum. Pt was mod assist yesterday with transfers and standing during therapy, impaired by substantial vertigo. Pt was driving and independent prior to admission, aking car of adult son with CP     Assessment: Right lateral medullary and cerebellar infarcts   Plan:  This patient would benefit from acute inpatient rehab to address balance, coordination, strength, self-care. Additionally, the patient requires daily MD oversight of the active medical issues noted above. Projected goals would be likely supervision. ELOS 12-15 days. Need to confirm social supports.     Rehab Admissions Coordinator to follow up.    Ranelle Oyster, MD, [facility] Physical Medicine & Rehabilitation Medical Director Rehabilitation Services 04/13/2023

## 2023-04-13 NOTE — Progress Notes (Signed)
NEUROLOGY CONSULT FOLLOW UP NOTE   Date of service: April 13, 2023 Patient Name: Brittney Yu MRN:  [id number] DOB:  [birth year]  Interval Hx/subjective   She is feeling significantly better Vitals   Vitals:   04/12/23 2216 04/13/23 0216 04/13/23 0619 04/13/23 0720  BP: (!) 110/48 (!) 123/53 (!) 112/52 (!) 113/48  Pulse: 65 66 69 65  Resp: 18 18 18 16   Temp: 98.9 F (37.2 C) 99.1 F (37.3 C) 98 F (36.7 C) 98.9 F (37.2 C)  TempSrc:      SpO2: 98% 100% 100% 98%  Weight: 105 kg     Height: 5' 9.5" (1.765 m)        Body mass index is 33.69 kg/m.  Physical Exam   General: In bed, NAD  Neurologic Examination   MS: Awake, alert, interactive and appropriate CN: EOMI Motor: Good strength throughout Sensory: Intact to light touch Cerebellar: She has very mild difficulty on the right compared to the left  Labs and Diagnostic Imaging   CBC:  Recent Labs  Lab 04/12/23 0135  WBC 6.1  NEUTROABS 3.7  HGB 12.5  HCT 39.2  MCV 87.3  PLT 319    Basic Metabolic Panel:  Lab Results  Component Value Date   NA 141 04/12/2023   K 3.7 04/12/2023   CO2 23 04/12/2023   GLUCOSE 178 (H) 04/12/2023   BUN 18 04/12/2023   CREATININE 0.70 04/12/2023   CALCIUM 8.8 (L) 04/12/2023   GFRNONAA >60 04/12/2023   Lipid Panel:  Lab Results  Component Value Date   LDLCALC 113 (H) 04/12/2023   HgbA1c:  Lab Results  Component Value Date   HGBA1C 5.7 (H) 04/12/2023   Urine Drug Screen: No results found for: "LABOPIA", "COCAINSCRNUR", "LABBENZ", "AMPHETMU", "THCU", "LABBARB"  Alcohol Level No results found for: "ETH" INR No results found for: "INR" APTT No results found for: "APTT"  Impression   Brittney Yu is a 58 y.o. 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  Embolism would be another consideration and she is currently undergoing telemetry monitoring  as well as pending echocardiogram.  If no evidence of embolic source on either of these test, I would favor treating her medically with dual antiplatelet therapy for 3 weeks followed by aspirin monotherapy, high intensity statin, BP control.  Recommendations  Start atorvastatin 80 mg nightly Continue aspirin and Plavix for 3 weeks followed by aspirin monotherapy PT, OT, ST Follow-up echocardiogram results and telemetry Neurology will continue to be available on an as-needed basis, please call if further questions or concerns. ______________________________________________________________________   Thank you for the opportunity to take part in the care of this patient. If you have any further questions, please contact the neurology consultation team on call. Updated oncall schedule is listed on AMION.  Signed,  Ritta Slot, MD [facility] [phone]  If 7pm- 7am, please page neurology on call as listed in AMION.

## 2023-04-13 NOTE — Progress Notes (Addendum)
  PROGRESS NOTE    Brittney Yu  ZOX:[id number] DOB: [birth year] DOA: 04/12/2023 PCP: Hillery Aldo, MD  105A/105A-BB  LOS: 1 day   Brief hospital course:   Assessment & Plan: Brittney Yu is a 58 y.o. female with no significant PMH who presented with sudden onset dizziness and weakness.    # Acute stroke  Right vertebral occlusion --presented with symptoms of vertigo and unsteady gait.  MRI brain showed Small acute infarcts in the right lateral medulla and right cerebellum.  CTA head/neck showed right vertebral occlusion. Plan: --start Lipitor 80  --Continue aspirin and Plavix for 3 weeks followed by aspirin monotherapy  --Echo --tele monitor --PT/OT rec CIR   # Nausea and dizziness  --pt has vertigo likely from her stroke.  Much improved today. 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   Objective: Vitals:   04/13/23 0216 04/13/23 0619 04/13/23 0720 04/13/23 1811  BP: (!) 123/53 (!) 112/52 (!) 113/48 136/63  Pulse: 66 69 65 78  Resp: 18 18 16    Temp: 99.1 F (37.3 C) 98 F (36.7 C) 98.9 F (37.2 C) 98.4 F (36.9 C)  TempSrc:      SpO2: 100% 100% 98% 99%  Weight:      Height:        Intake/Output Summary (Last 24 hours) at 04/13/2023 1917 Last data filed at 04/13/2023 1445 Gross per 24 hour  Intake 240 ml  Output --  Net 240 ml   Filed Weights   04/12/23 0135 04/12/23 2216  Weight: 102.5 kg 105 kg    Examination:   Constitutional: NAD, AAOx3 HEENT: conjunctivae and lids normal, EOMI CV: No cyanosis.   RESP: normal respiratory effort, on RA Psych: Normal mood and  affect.  Appropriate judgement and reason   Data Reviewed: I have personally reviewed labs and imaging studies  Time spent: 35 minutes  Darlin Priestly, MD [facility] If 7PM-7AM, please contact night-coverage 04/13/2023, 7:17 PM

## 2023-04-13 NOTE — Plan of Care (Signed)
  Problem: Education: Goal: Knowledge of disease or condition will improve 04/13/2023 2040 by Rise Patience, RN Outcome: Progressing 04/13/2023 2039 by Rise Patience, RN Outcome: Progressing 04/13/2023 0707 by Rise Patience, RN Outcome: Progressing   Problem: Ischemic Stroke/TIA Tissue Perfusion: Goal: Complications of ischemic stroke/TIA will be minimized 04/13/2023 2040 by Rise Patience, RN Outcome: Progressing 04/13/2023 2039 by Rise Patience, RN Outcome: Progressing 04/13/2023 0707 by Rise Patience, RN Outcome: Progressing   Problem: Coping: Goal: Will verbalize positive feelings about self 04/13/2023 2040 by Rise Patience, RN Outcome: Progressing 04/13/2023 2039 by Rise Patience, RN Outcome: Progressing 04/13/2023 0707 by Rise Patience, RN Outcome: Progressing   Problem: Health Behavior/Discharge Planning: Goal: Ability to manage health-related needs will improve Outcome: Progressing   Problem: Self-Care: Goal: Ability to participate in self-care as condition permits will improve 04/13/2023 2040 by Rise Patience, RN Outcome: Progressing 04/13/2023 2039 by Rise Patience, RN Outcome: Progressing 04/13/2023 0707 by Rise Patience, RN Outcome: Progressing   Problem: Nutrition: Goal: Risk of aspiration will decrease 04/13/2023 2040 by Rise Patience, RN Outcome: Progressing 04/13/2023 2039 by Rise Patience, RN Outcome: Progressing   Problem: Education: Goal: Knowledge of General Education information will improve Description: Including pain rating scale, medication(s)/side effects and non-pharmacologic comfort measures 04/13/2023 2039 by Rise Patience, RN Outcome: Progressing 04/13/2023 0707 by Rise Patience, RN Outcome: Progressing   Problem: Health Behavior/Discharge Planning: Goal: Ability to manage health-related needs will improve 04/13/2023 2040 by Rise Patience, RN Outcome: Progressing 04/13/2023 0707 by Rise Patience, RN Outcome: Progressing    Problem: Clinical Measurements: Goal: Ability to maintain clinical measurements within normal limits will improve Outcome: Progressing   Problem: Activity: Goal: Risk for activity intolerance will decrease 04/13/2023 2040 by Rise Patience, RN Outcome: Progressing 04/13/2023 2039 by Rise Patience, RN Outcome: Progressing   Problem: Nutrition: Goal: Adequate nutrition will be maintained 04/13/2023 2040 by Rise Patience, RN Outcome: Progressing 04/13/2023 2039 by Rise Patience, RN Outcome: Progressing 04/13/2023 0707 by Rise Patience, RN Outcome: Progressing   Problem: Coping: Goal: Level of anxiety will decrease 04/13/2023 2040 by Rise Patience, RN Outcome: Progressing 04/13/2023 0707 by Rise Patience, RN Outcome: Progressing   Problem: Elimination: Goal: Will not experience complications related to bowel motility Outcome: Progressing   Problem: Pain Managment: Goal: General experience of comfort will improve and/or be controlled 04/13/2023 2040 by Rise Patience, RN Outcome: Progressing 04/13/2023 2039 by Rise Patience, RN Outcome: Progressing   Problem: Safety: Goal: Ability to remain free from injury will improve 04/13/2023 2040 by Rise Patience, RN Outcome: Progressing 04/13/2023 2039 by Rise Patience, RN Outcome: Progressing   Problem: Skin Integrity: Goal: Risk for impaired skin integrity will decrease 04/13/2023 2040 by Rise Patience, RN Outcome: Progressing 04/13/2023 2039 by Rise Patience, RN Outcome: Progressing

## 2023-04-13 NOTE — Progress Notes (Addendum)
Inpatient Rehab Admissions Coordinator:   Attempted to reach pt by room phone but no answer.  I was able to reach her sister who states pt's mom is at bedside, and she will contact them and ask them to give me a call to discuss CIR recommendations.    1149: Spoke to pt's mother and pt on the phone regarding CIR recommendations.  We discussed estimated LOS 10-14 days with goals of supervision to mod I.  Pt's adult daughter, Amiracle lives in the home and is currently providing care for pt's adult son (dx CP).  She is able to provide support at discharge and pt's mom, Harriett Sine, confirms.  Harriett Sine is also available to support PRN as well.  We reviewed insurance process and pt confirms [employer].  I will start with prior auth today.   Estill Dooms, PT, DPT Admissions Coordinator [phone] 04/13/23  11:35 AM

## 2023-04-13 NOTE — Plan of Care (Signed)

## 2023-04-13 NOTE — Progress Notes (Signed)
Physical Therapy Treatment Patient Details Name: Brittney Yu MRN: [id number] DOB: 11-10-1965 Today's Date: 04/13/2023   History of Present Illness Pt is a 58 yo female that presented to the ED for weakness and dizziness, and R sided HA with vomiting. 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    PT Comments  Pt was long sitting in bed with supportive mother at bedside. Pt is alert and extremely pleasant. Very motivated to improve to return to home after hopeful CIR admission. Pt is extremely cooperative and able to consistently follow commands throughout. BP at rest in bed 122/57(74). Pt still requiring min assist + use of bed functions to achieve EOB sitting. She stood EOB  3 x total prior to taking steps along EOB to recliner. In standing, pt tends to present with R lateral lean.With vcs pt is able to correct without physical assistance. BP 139/68(88) in standing. No c/o symptoms of dizziness or headache throughout session however pt does endorse fatigue.  At conclusion of session, pt was repositioned in recliner with call bell in reach. DC recs remain appropriate to maximize pt's independence and safety with all ADLs    If plan is discharge home, recommend the following: A lot of help with walking and/or transfers;A lot of help with bathing/dressing/bathroom;Assistance with cooking/housework;Assist for transportation;Help with stairs or ramp for entrance     Equipment Recommendations  Other (comment) (Defer to next level of care)       Precautions / Restrictions Precautions Precautions: Fall Restrictions Weight Bearing Restrictions Per Provider Order: No     Mobility  Bed Mobility Overal bed mobility: Needs Assistance Bed Mobility: Supine to Sit  Supine to sit: Min assist, Used rails, HOB elevated  General bed mobility comments: Increased time required to perform. vcs for sequencing and handplacement. slow moving to decrease likihood of  dizziness. 122/57(74) prior to sitting up EOB    Transfers Overall transfer level: Needs assistance Equipment used: Rolling walker (2 wheels) Transfers: Sit to/from Stand Sit to Stand: From elevated surface, Mod assist (+1)  General transfer comment: pt performed STS 3 x EOB. stood EOB an performed marching in place and L lateral wt shift due to pt tending to have R lateral lean in standing. pt tolerated standing activity well. BP upon [phone])    Ambulation/Gait Ambulation/Gait assistance: Min assist, Mod assist Gait Distance (Feet): 3 Feet Assistive device: Rolling walker (2 wheels) Gait Pattern/deviations: Step-to pattern, Decreased weight shift to left, Staggering right Gait velocity: decreased  General Gait Details: pt was able to march in place alternating 20 x prior to ambulating a few feet along EOB to recliner    Balance Overall balance assessment: Needs assistance Sitting-balance support: Feet supported, Single extremity supported Sitting balance-Leahy Scale: Good Sitting balance - Comments: no LOB while seated EOB with BLE feet support on floor   Standing balance support: Bilateral upper extremity supported, During functional activity, Reliant on assistive device for balance Standing balance-Leahy Scale: Fair Standing balance comment: pt has a R lateral lean but with vcs is easily able to correct. does have one occasion of staggering but no intervention required.      Cognition Arousal: Alert Behavior During Therapy: WFL for tasks assessed/performed, Flat affect Overall Cognitive Status: Within Functional Limits for tasks assessed   General Comments: Pt is A and O x 4. Extremely pleasant and motivated. Overall limited by fatigue               Pertinent  Vitals/Pain Pain Assessment Pain Assessment: No/denies pain     PT Goals (current goals can now be found in the care plan section) Acute Rehab PT Goals Patient Stated Goal: Get better so I can return  home to my family Progress towards PT goals: Progressing toward goals    Frequency    Min 1X/week           Co-evaluation     PT goals addressed during session: Mobility/safety with mobility;Balance;Strengthening/ROM;Proper use of DME        AM-PAC PT "6 Clicks" Mobility   Outcome Measure  Help needed turning from your back to your side while in a flat bed without using bedrails?: A Little Help needed moving from lying on your back to sitting on the side of a flat bed without using bedrails?: A Little Help needed moving to and from a bed to a chair (including a wheelchair)?: A Little Help needed standing up from a chair using your arms (e.g., wheelchair or bedside chair)?: A Little Help needed to walk in hospital room?: A Lot Help needed climbing 3-5 steps with a railing? : A Lot 6 Click Score: 16    End of Session   Activity Tolerance: Patient tolerated treatment well;Patient limited by fatigue Patient left: in chair;with call bell/phone within reach;with family/visitor present Nurse Communication: Mobility status PT Visit Diagnosis: Other abnormalities of gait and mobility (R26.89);Difficulty in walking, not elsewhere classified (R26.2);Muscle weakness (generalized) (M62.81)     Time: 1610-9604 PT Time Calculation (min) (ACUTE ONLY): 24 min  Charges:    $Therapeutic Activity: 8-22 mins $Neuromuscular Re-education: 8-22 mins PT General Charges $$ ACUTE PT VISIT: 1 Visit                     Jetta Lout PTA 04/13/23, 4:10 PM

## 2023-04-13 NOTE — Progress Notes (Signed)
*  PRELIMINARY RESULTS* Echocardiogram 2D Echocardiogram has been performed.  Carolyne Fiscal 04/13/2023, 3:35 PM

## 2023-04-13 NOTE — Plan of Care (Signed)
  Problem: Education: Goal: Knowledge of disease or condition will improve 04/13/2023 2039 by Rise Patience, RN Outcome: Progressing 04/13/2023 0707 by Rise Patience, RN Outcome: Progressing   Problem: Ischemic Stroke/TIA Tissue Perfusion: Goal: Complications of ischemic stroke/TIA will be minimized 04/13/2023 2039 by Rise Patience, RN Outcome: Progressing 04/13/2023 0707 by Rise Patience, RN Outcome: Progressing   Problem: Coping: Goal: Will verbalize positive feelings about self 04/13/2023 2039 by Rise Patience, RN Outcome: Progressing 04/13/2023 0707 by Rise Patience, RN Outcome: Progressing   Problem: Self-Care: Goal: Ability to participate in self-care as condition permits will improve 04/13/2023 2039 by Rise Patience, RN Outcome: Progressing 04/13/2023 0707 by Rise Patience, RN Outcome: Progressing   Problem: Nutrition: Goal: Risk of aspiration will decrease Outcome: Progressing   Problem: Education: Goal: Knowledge of General Education information will improve Description: Including pain rating scale, medication(s)/side effects and non-pharmacologic comfort measures 04/13/2023 2039 by Rise Patience, RN Outcome: Progressing 04/13/2023 0707 by Rise Patience, RN Outcome: Progressing   Problem: Health Behavior/Discharge Planning: Goal: Ability to manage health-related needs will improve Outcome: Progressing   Problem: Clinical Measurements: Goal: Ability to maintain clinical measurements within normal limits will improve Outcome: Progressing   Problem: Activity: Goal: Risk for activity intolerance will decrease Outcome: Progressing   Problem: Nutrition: Goal: Adequate nutrition will be maintained 04/13/2023 2039 by Rise Patience, RN Outcome: Progressing 04/13/2023 0707 by Rise Patience, RN Outcome: Progressing   Problem: Coping: Goal: Level of anxiety will decrease Outcome: Progressing   Problem: Elimination: Goal: Will not experience complications  related to bowel motility Outcome: Progressing   Problem: Pain Managment: Goal: General experience of comfort will improve and/or be controlled Outcome: Progressing   Problem: Safety: Goal: Ability to remain free from injury will improve Outcome: Progressing   Problem: Skin Integrity: Goal: Risk for impaired skin integrity will decrease Outcome: Progressing

## 2023-04-13 NOTE — Plan of Care (Signed)
  Problem: Education: Goal: Knowledge of disease or condition will improve Outcome: Progressing   Problem: Ischemic Stroke/TIA Tissue Perfusion: Goal: Complications of ischemic stroke/TIA will be minimized Outcome: Progressing   Problem: Coping: Goal: Will verbalize positive feelings about self Outcome: Progressing   Problem: Self-Care: Goal: Ability to participate in self-care as condition permits will improve Outcome: Progressing   Problem: Education: Goal: Knowledge of General Education information will improve Description: Including pain rating scale, medication(s)/side effects and non-pharmacologic comfort measures Outcome: Progressing   Problem: Health Behavior/Discharge Planning: Goal: Ability to manage health-related needs will improve Outcome: Progressing   Problem: Clinical Measurements: Goal: Ability to maintain clinical measurements within normal limits will improve Outcome: Progressing   Problem: Nutrition: Goal: Adequate nutrition will be maintained Outcome: Progressing   Problem: Coping: Goal: Level of anxiety will decrease Outcome: Progressing   Problem: Elimination: Goal: Will not experience complications related to bowel motility Outcome: Progressing

## 2023-04-13 NOTE — TOC Progression Note (Signed)
Transition of Care [facility]) - Progression Note    Patient Details  Name: Brittney Yu MRN: [id number] Date of Birth: Mar 01, 1966  [facility]) CM/SW Contact  Allena Katz, LCSW Phone Number: 04/13/2023, 10:00 AM  Clinical Narrative:   Pt to be evaluated by CIR. TOC following for needs.    Expected Discharge Plan: IP Rehab Facility Barriers to Discharge: Continued Medical Work up  Expected Discharge Plan and Services     Post Acute Care Choice: IP Rehab Living arrangements for the past 2 months: Single Family Home                                       Social Determinants of Health (SDOH) Interventions SDOH Screenings   Food Insecurity: No Food Insecurity (04/13/2023)  Housing: Unknown (04/13/2023)  Transportation Needs: No Transportation Needs (04/13/2023)  Utilities: Not At Risk (04/13/2023)  Tobacco Use: Low Risk  (04/12/2023)    Readmission Risk Interventions     No data to display

## 2023-04-13 NOTE — PMR Pre-admission (Signed)
PMR Admission Coordinator Pre-Admission Assessment  Patient: Brittney Yu is an 58 y.o., female MRN: [id number] DOB: Nov 13, 1965 Height: 5' 9.5" (176.5 cm) Weight: 105 kg  Insurance Information HMO:     PPO:      PCP:      IPA:      80/20:      OTHER:  PRIMARY: [place] [employer] Blue      Policy#: [id number]       Subscriber: pt CM Name:       Phone#: [phone]     Fax#: [phone] Pre-Cert#: [id number] auth for CIR from 04/14/23 to 04/20/23 with  Medicaid Healthy Blue with updates due to fax [phone] (per case management department) on 04/21/23      Employer: Not employed Benefits:  Phone #: [phone]     Name: Verified on 04/13/23 Eff. Date: 02/11/22 through 05/12/23     Deduct: $0      Out of Pocket Max: $0      Life Max: n/a CIR: 100%      SNF: 100% Outpatient:      Co-Pay: $4 copay/visit Home Health:  100%     Co-Pay:  DME:   100%    Co-Pay:  Providers: in network  SECONDARY:       Policy#:      Phone#:   Artist:       Phone#:   The Data processing manager" for patients in Inpatient Rehabilitation Facilities with attached "Privacy Act Statement-Health Care Records" was provided and verbally reviewed with: Patient and Family  Emergency Contact Information Contact Information     Name Relation Home Work Mobile   [place] D Sister [phone]        Other Contacts   None on File     Current Medical History  Patient Admitting Diagnosis: R CVA   History of Present Illness: Pt is a 58 y/o female with no significant PMH who presented to [facility] on [facility]/12[facility] with c/o dizziness and weakness.  In ED, vitals reassuring and labs unremarkable.  MRI brain showed small acute infarcts in the right lateral medulla and right cerebellum.  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  Recommendations from neurology were for aspirin and plavix for 3 weeks followed by aspirin alone.  SLP has not evaluated patient yet.  Therapy evaluations were completed  and pt was recommended for CIR.   Complete NIHSS TOTAL: 2  Patient's medical record from [facility] has been reviewed by the rehabilitation admission coordinator and physician.  Past Medical History  History reviewed. No pertinent past medical history.  Has the patient had major surgery during 100 days prior to admission? No  Family History   family history includes Breast cancer in her father; Hypertension in her mother.  Current Medications  Current Facility-Administered Medications:    acetaminophen (TYLENOL) tablet 1,000 mg, 1,000 mg, Oral, TID PRN, Darlin Priestly, MD, 1,000 mg at 04/14/23 4270   aspirin EC tablet 81 mg, 81 mg, Oral, Daily, Rejeana Brock, MD, 81 mg at 04/14/23 0815   atorvastatin (LIPITOR) tablet 80 mg, 80 mg, Oral, Daily, Rejeana Brock, MD, 80 mg at 04/14/23 0815   [COMPLETED] clopidogrel (PLAVIX) tablet 300 mg, 300 mg, Oral, Once, 300 mg at 04/12/23 2024 **AND** clopidogrel (PLAVIX) tablet 75 mg, 75 mg, Oral, Daily, Rejeana Brock, MD, 75 mg at 04/14/23 0815   enoxaparin (LOVENOX) injection 40 mg, 40 mg, Subcutaneous, Q24H, Darlin Priestly, MD, 40 mg at 04/14/23 6237   meclizine (ANTIVERT) tablet  25 mg, 25 mg, Oral, TID PRN, Darlin Priestly, MD, 25 mg at 04/14/23 0826   ondansetron [facility]) injection 4 mg, 4 mg, Intravenous, Q6H PRN, Darlin Priestly, MD, 4 mg at 04/12/23 2311  Patients Current Diet:  Diet Order             Diet - low sodium heart healthy           Diet Heart Room service appropriate? Yes; Fluid consistency: Thin  Diet effective now                   Precautions / Restrictions Precautions Precautions: Fall Restrictions Weight Bearing Restrictions Per Provider Order: No   Has the patient had 2 or more falls or a fall with injury in the past year? 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 Independent  Indoor Mobility: Did the patient need assistance with walking from room to room (with or without device)? Independent  Stairs: Did the patient need assistance with internal or external stairs (with or without device)? Independent  Functional Cognition: Did the patient need help planning regular tasks such as shopping or remembering to take medications? Independent  Patient Information Are you of Hispanic, Latino/a,or Spanish origin?: A. No, not of Hispanic, Latino/a, or Spanish origin What is your race?: B. Black or African American Do you need or want an interpreter to communicate with a doctor or health care staff?: 0. No  Patient's Response To:  Health Literacy and Transportation Is the patient able to respond to health literacy and transportation needs?: Yes Health Literacy - How often do you need to have someone help you when you read instructions, pamphlets, or other written material from your doctor or pharmacy?: Never In the past 12 months, has lack of transportation kept you from medical appointments or from getting medications?: No In the past 12 months, has lack of transportation kept you from meetings, work, or from getting things needed for daily living?: No  Home Assistive Devices / Equipment Home Equipment: None  Prior Device Use: Indicate devices/aids used by the patient prior to current illness, exacerbation or injury? None of the above  Current Functional Level Cognition  Overall Cognitive Status: Within Functional Limits for tasks assessed Orientation Level: Oriented X4 General Comments: Pt is A and O x 4. Extremely pleasant and motivated. Overall limited by fatigue    Extremity Assessment (includes Sensation/Coordination)  Upper Extremity Assessment: Right hand dominant, RUE deficits/detail RUE Deficits / Details: mild deficits in R hand FMC/dexterity, will continue to assess; A/PROM grossly WFL,  sensation appears grossly WFL however will continue toassess RUE Coordination: decreased fine motor  Lower Extremity Assessment: Defer to PT evaluation    ADLs  Overall ADL's : Needs assistance/impaired Eating/Feeding: Supervision/ safety, Sitting Grooming: Wash/dry face, Sitting, Supervision/safety Lower Body Dressing: Maximal assistance Lower Body Dressing Details (indicate cue type and reason): CGA to doff one sock, limited by nausea; MAX A to doff other sock and to donn grip socks Toilet Transfer: Moderate assistance, +2 for physical assistance, +2 for safety/equipment Toilet Transfer Details (indicate cue type and reason): step pivot to bsc +2 transfer to bsc, +1 transfer back Toileting- Clothing Manipulation and Hygiene: Maximal assistance, Sit to/from stand Toileting - Clothing Manipulation Details (indicate cue type and reason): continent urine on toilet General ADL Comments: limited  by n/v on this date    Mobility  Overal bed mobility: Needs Assistance Bed Mobility: Supine to Sit Supine to sit: Min assist, Used rails, HOB elevated Sit to supine: Contact guard assist General bed mobility comments: Increased time required to perform. vcs for sequencing and handplacement. slow moving to decrease likihood of dizziness. 122/57(74) prior to sitting up EOB    Transfers  Overall transfer level: Needs assistance Equipment used: Rolling walker (2 wheels) Transfers: Sit to/from Stand Sit to Stand: From elevated surface, Mod assist (+1) General transfer comment: pt performed STS 3 x EOB. stood EOB an performed marching in place and L lateral wt shift due to pt tending to have R lateral lean in standing. pt tolerated standing activity well. BP upon [phone])    Ambulation / Gait / Stairs / Wheelchair Mobility  Ambulation/Gait Ambulation/Gait assistance: Min assist, Mod assist Gait Distance (Feet): 3 Feet Assistive device: Rolling walker (2 wheels) Gait Pattern/deviations:  Step-to pattern, Decreased weight shift to left, Staggering right General Gait Details: pt was able to march in place alternating 20 x prior to ambulating a few feet along EOB to recliner Gait velocity: decreased    Posture / Balance Dynamic Sitting Balance Sitting balance - Comments: no LOB while seated EOB with BLE feet support on floor Balance Overall balance assessment: Needs assistance Sitting-balance support: Feet supported, Single extremity supported Sitting balance-Leahy Scale: Good Sitting balance - Comments: no LOB while seated EOB with BLE feet support on floor Standing balance support: Bilateral upper extremity supported, During functional activity, Reliant on assistive device for balance Standing balance-Leahy Scale: Fair Standing balance comment: pt has a R lateral lean but with vcs is easily able to correct. does have one occasion of staggering but no intervention required.    Special needs/care consideration Diabetic management hgb A1C 5.7   Previous Home Environment (from acute therapy documentation) Living Arrangements: Children Available Help at Discharge: Family, Available 24 hours/day Type of Home: Mobile home Home Layout: One level Home Access: Stairs to enter Entrance Stairs-Rails: Can reach both Entrance Stairs-Number of Steps: 5, ramp Bathroom Shower/Tub: Health visitor: Handicapped height Bathroom Accessibility: Yes How Accessible: Accessible via walker Home Care Services: No  Discharge Living Setting Plans for Discharge Living Setting: Patient's home, Lives with (comment) (adult children (son and daughter)) Type of Home at Discharge: Mobile home Discharge Home Layout: One level Discharge Home Access: Ramped entrance Discharge Bathroom Shower/Tub: Walk-in shower Discharge Bathroom Toilet: Handicapped height Discharge Bathroom Accessibility: Yes How Accessible: Accessible via wheelchair Does the patient have any problems obtaining your  medications?: No  Social/Family/Support Systems Patient Roles: Parent, Caregiver Contact Information: adult son is dependent for ADLs/iADLs and she is his caregiver; her adult daughter Amiracle is currently providing care for him and able to provide assist for her at discharge if needed Anticipated Caregiver: daughter, Amiracle, and mom, Harriett Sine Anticipated Industrial/product designer Information: Harriett Sine: [phone] Ability/Limitations of Caregiver: none stated Caregiver Availability: 24/7 Discharge Plan Discussed with Primary Caregiver: Yes Is Caregiver In Agreement with Plan?: Yes Does Caregiver/Family have Issues with Lodging/Transportation while Pt is in Rehab?: No  Goals Patient/Family Goal for Rehab: PT/OT supervision to mod I, SLP n/a Expected length of stay: 10-14 days Additional Information: Discharge plan: return to patient's home where she lives with her 2 adult children (1 is disabled and the other is providing care for him at this time).  She also has support from her mother. Pt/Family Agrees to Admission and willing to participate: Yes Program Orientation  Provided & Reviewed with Pt/Caregiver Including Roles  & Responsibilities: Yes  Decrease burden of Care through IP rehab admission: n/a  Possible need for SNF placement upon discharge: Not anticipated.  Pt has excellent support from her mother and adult daughter at discharge.  Discharge plan: return to pt's home, daughter is primary 24/7 caregiver, and mother is secondary caregiver.   Patient Condition: I have reviewed medical records from [facility], spoken with CSW, and patient, daughter, and family member. I discussed via phone for inpatient rehabilitation assessment.  Patient will benefit from ongoing PT, OT, and SLP, can actively participate in 3 hours of therapy a day 5 days of the week, and can make measurable gains during the admission.  Patient will also benefit from the coordinated team approach during an Inpatient Acute  Rehabilitation admission.  The patient will receive intensive therapy as well as Rehabilitation physician, nursing, social worker, and care management interventions.  Due to safety, skin/wound care, disease management, medication administration, pain management, and patient education the patient requires 24 hour a day rehabilitation nursing.  The patient is currently min assist with mobility and basic ADLs.  Discharge setting and therapy post discharge at  home  is anticipated.  Patient has agreed to participate in the Acute Inpatient Rehabilitation Program and will admit today.  Preadmission Screen Completed By:  Trish Mage, PT, DPT 04/14/2023 11:39 AM ______________________________________________________________________   Discussed status with Dr. Riley Kill on 04/14/23 at 0915 and received approval for admission today.  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 Yes Co-Morbidities requiring supervision/potential complications: Dizziness/vertigo, nausea, headaches, right vertebral artery occlusion, hyperlipidemia Due to bowel management, safety, skin/wound care, disease management, medication administration, pain management, and patient education, does the patient require 24 hr/day rehab nursing? Yes Does the patient require coordinated care of a physician, rehab nurse, PT, OT to address physical and functional deficits in the context of the above medical diagnosis(es)? Yes Addressing deficits in the following areas: balance, endurance, locomotion, strength, transferring, bowel/bladder control, bathing, dressing, feeding, grooming, and toileting Can the patient actively participate in an  intensive therapy program of at least 3 hrs of therapy 5 days a week? Yes The potential for patient to make measurable gains while on inpatient rehab is good Anticipated functional outcomes upon discharge from inpatient rehab: supervision PT, supervision OT Estimated rehab length of stay to reach the above functional goals is: 10-14 days Anticipated discharge destination: Home 10. Overall Rehab/Functional Prognosis: good   MD Signature:  Angelina Sheriff, DO 04/14/2023

## 2023-04-13 NOTE — TOC Progression Note (Signed)
Transition of Care [facility]) - Progression Note    Patient Details  Name: Brittney Yu MRN: [id number] Date of Birth: 10/13/1965  [facility] CM/SW Contact  Allena Katz, LCSW Phone Number: 04/13/2023, 4:27 PM  Clinical Narrative:   [facility] started for CIR.     Expected Discharge Plan: IP Rehab Facility Barriers to Discharge: Continued Medical Work up  Expected Discharge Plan and Services     Post Acute Care Choice: IP Rehab Living arrangements for the past 2 months: Single Family Home                                       Social Determinants of Health (SDOH) Interventions SDOH Screenings   Food Insecurity: No Food Insecurity (04/13/2023)  Housing: Unknown (04/13/2023)  Transportation Needs: No Transportation Needs (04/13/2023)  Utilities: Not At Risk (04/13/2023)  Tobacco Use: Low Risk  (04/12/2023)    Readmission Risk Interventions     No data to display

## 2023-04-14 ENCOUNTER — Inpatient Hospital Stay (HOSPITAL_COMMUNITY)
Admission: AD | Admit: 2023-04-14 | Discharge: 2023-04-25 | DRG: 057 | Disposition: A | Discharge status: Home / Self Care | Payer: Medicaid Other | Source: Intra-hospital | Attending: Physical Medicine & Rehabilitation | Admitting: Physical Medicine & Rehabilitation

## 2023-04-14 ENCOUNTER — Encounter (HOSPITAL_COMMUNITY): Payer: Self-pay | Admitting: Physical Medicine & Rehabilitation

## 2023-04-14 ENCOUNTER — Other Ambulatory Visit: Payer: Self-pay

## 2023-04-14 DIAGNOSIS — Z7902 Long term (current) use of antithrombotics/antiplatelets: Secondary | ICD-10-CM

## 2023-04-14 DIAGNOSIS — R2689 Other abnormalities of gait and mobility: Secondary | ICD-10-CM | POA: Diagnosis present

## 2023-04-14 DIAGNOSIS — K5901 Slow transit constipation: Secondary | ICD-10-CM | POA: Diagnosis not present

## 2023-04-14 DIAGNOSIS — I63542 Cerebral infarction due to unspecified occlusion or stenosis of left cerebellar artery: Secondary | ICD-10-CM

## 2023-04-14 DIAGNOSIS — R278 Other lack of coordination: Secondary | ICD-10-CM | POA: Diagnosis not present

## 2023-04-14 DIAGNOSIS — Z79899 Other long term (current) drug therapy: Secondary | ICD-10-CM

## 2023-04-14 DIAGNOSIS — I63019 Cerebral infarction due to thrombosis of unspecified vertebral artery: Secondary | ICD-10-CM | POA: Diagnosis not present

## 2023-04-14 DIAGNOSIS — R209 Unspecified disturbances of skin sensation: Secondary | ICD-10-CM | POA: Diagnosis not present

## 2023-04-14 DIAGNOSIS — I69322 Dysarthria following cerebral infarction: Secondary | ICD-10-CM | POA: Diagnosis not present

## 2023-04-14 DIAGNOSIS — F54 Psychological and behavioral factors associated with disorders or diseases classified elsewhere: Secondary | ICD-10-CM | POA: Diagnosis not present

## 2023-04-14 DIAGNOSIS — I63219 Cerebral infarction due to unspecified occlusion or stenosis of unspecified vertebral arteries: Secondary | ICD-10-CM | POA: Diagnosis not present

## 2023-04-14 DIAGNOSIS — Z8249 Family history of ischemic heart disease and other diseases of the circulatory system: Secondary | ICD-10-CM

## 2023-04-14 DIAGNOSIS — I69398 Other sequelae of cerebral infarction: Secondary | ICD-10-CM | POA: Diagnosis present

## 2023-04-14 DIAGNOSIS — R7303 Prediabetes: Secondary | ICD-10-CM | POA: Diagnosis present

## 2023-04-14 DIAGNOSIS — H814 Vertigo of central origin: Secondary | ICD-10-CM | POA: Diagnosis present

## 2023-04-14 DIAGNOSIS — E785 Hyperlipidemia, unspecified: Secondary | ICD-10-CM | POA: Diagnosis present

## 2023-04-14 DIAGNOSIS — I639 Cerebral infarction, unspecified: Secondary | ICD-10-CM | POA: Diagnosis not present

## 2023-04-14 DIAGNOSIS — Z803 Family history of malignant neoplasm of breast: Secondary | ICD-10-CM | POA: Diagnosis not present

## 2023-04-14 DIAGNOSIS — I6389 Other cerebral infarction: Secondary | ICD-10-CM | POA: Diagnosis not present

## 2023-04-14 MED ORDER — MECLIZINE HCL 25 MG PO TABS: 25.0000 mg | ORAL_TABLET | Freq: Three times a day (TID) | ORAL | Status: DC | PRN

## 2023-04-14 MED ORDER — MELATONIN 5 MG PO TABS: 5.0000 mg | ORAL_TABLET | Freq: Every evening | ORAL | Status: DC | PRN

## 2023-04-14 MED ORDER — MECLIZINE HCL 25 MG PO TABS: 25.0000 mg | ORAL_TABLET | Freq: Four times a day (QID) | ORAL | Status: DC | PRN

## 2023-04-14 MED ORDER — ACETAMINOPHEN 500 MG PO TABS
1000.0000 mg | ORAL_TABLET | Freq: Three times a day (TID) | ORAL | Status: DC | PRN
  Administered 2023-04-14: 1000 mg via ORAL
  Filled 2023-04-14: qty 2

## 2023-04-14 MED ORDER — POLYETHYLENE GLYCOL 3350 17 G PO PACK
17.0000 g | PACK | Freq: Every day | ORAL | Status: DC | PRN
  Administered 2023-04-17: 17 g via ORAL

## 2023-04-14 MED ORDER — CLOPIDOGREL BISULFATE 75 MG PO TABS
75.0000 mg | ORAL_TABLET | Freq: Every day | ORAL | Status: DC
  Administered 2023-04-15 – 2023-04-25 (×11): 75 mg via ORAL
  Filled 2023-04-14 (×11): qty 1

## 2023-04-14 MED ORDER — ATORVASTATIN CALCIUM 80 MG PO TABS: 80.0000 mg | ORAL_TABLET | Freq: Every day | ORAL | Status: DC

## 2023-04-14 MED ORDER — ACETAMINOPHEN 325 MG PO TABS
325.0000 mg | ORAL_TABLET | ORAL | Status: DC | PRN
Start: 2023-04-14 — End: 2023-04-25

## 2023-04-14 MED ORDER — CLOPIDOGREL BISULFATE 75 MG PO TABS: 75.0000 mg | ORAL_TABLET | Freq: Every day | ORAL | Status: DC

## 2023-04-14 MED ORDER — METHOCARBAMOL 500 MG PO TABS: 500.0000 mg | ORAL_TABLET | Freq: Four times a day (QID) | ORAL | Status: DC | PRN

## 2023-04-14 MED ORDER — FLEET ENEMA RE ENEM: 1.0000 | ENEMA | Freq: Once | RECTAL | Status: DC | PRN

## 2023-04-14 MED ORDER — ENOXAPARIN SODIUM 40 MG/0.4ML IJ SOSY
40.0000 mg | PREFILLED_SYRINGE | INTRAMUSCULAR | Status: DC
  Administered 2023-04-15 – 2023-04-24 (×10): 40 mg via SUBCUTANEOUS
  Filled 2023-04-14 (×10): qty 0.4

## 2023-04-14 MED ORDER — ASPIRIN 81 MG PO TBEC: 81.0000 mg | DELAYED_RELEASE_TABLET | Freq: Every day | ORAL | Status: DC

## 2023-04-14 MED ORDER — ATORVASTATIN CALCIUM 80 MG PO TABS
80.0000 mg | ORAL_TABLET | Freq: Every day | ORAL | Status: DC
  Administered 2023-04-15 – 2023-04-25 (×11): 80 mg via ORAL
  Filled 2023-04-14 (×11): qty 1

## 2023-04-14 MED ORDER — DIPHENHYDRAMINE HCL 25 MG PO CAPS: 25.0000 mg | ORAL_CAPSULE | Freq: Four times a day (QID) | ORAL | Status: DC | PRN

## 2023-04-14 MED ORDER — GUAIFENESIN-DM 100-10 MG/5ML PO SYRP: 10.0000 mL | ORAL_SOLUTION | Freq: Four times a day (QID) | ORAL | Status: DC | PRN

## 2023-04-14 MED ORDER — ONDANSETRON HCL 4 MG/2ML IJ SOLN: 4.0000 mg | Freq: Four times a day (QID) | INTRAMUSCULAR | Status: DC | PRN

## 2023-04-14 MED ORDER — ASPIRIN 81 MG PO TBEC
81.0000 mg | DELAYED_RELEASE_TABLET | Freq: Every day | ORAL | Status: DC
  Administered 2023-04-15 – 2023-04-25 (×11): 81 mg via ORAL
  Filled 2023-04-14 (×11): qty 1

## 2023-04-14 MED ORDER — ENOXAPARIN SODIUM 40 MG/0.4ML IJ SOSY: 40.0000 mg | PREFILLED_SYRINGE | INTRAMUSCULAR | Status: DC

## 2023-04-14 MED ORDER — BISACODYL 5 MG PO TBEC
5.0000 mg | DELAYED_RELEASE_TABLET | Freq: Every day | ORAL | Status: DC | PRN
  Administered 2023-04-15 – 2023-04-16 (×2): 5 mg via ORAL
  Filled 2023-04-14 (×2): qty 1

## 2023-04-14 MED ORDER — ONDANSETRON HCL 4 MG PO TABS: 4.0000 mg | ORAL_TABLET | Freq: Four times a day (QID) | ORAL | Status: DC | PRN

## 2023-04-14 MED ORDER — ALUM & MAG HYDROXIDE-SIMETH 200-200-20 MG/5ML PO SUSP: 30.0000 mL | ORAL | Status: DC | PRN

## 2023-04-14 NOTE — H&P (Incomplete)
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  She was hemodynamically stable and labs were unremarkable.  She has no significant medical history.  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  Also showed abnormal right vertebral flow void and recommended CTA of head and neck.  Admitted to hospitalist service and neurology consulted.  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  She was given Plavix 300 mg load and is now on daily aspirin 81 mg and Plavix 75 mg.  2D echo with EF of 60 to 65%.  Hemoglobin A1c 5.7%.  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  No evidence of embolic source.  She was started on atorvastatin 80 mg nightly.  Required min assist for mobility. Right lateral lean noted. 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   ROS History reviewed. No pertinent past medical history. Past Surgical History:  Procedure Laterality Date   SHOULDER SURGERY Left    Family History  Problem Relation Age of Onset   Hypertension Mother    Breast cancer Father    Social History:  reports that she has never smoked. She has never used smokeless tobacco. She reports that she does not drink alcohol and does not use drugs. Allergies: No Known Allergies Medications Prior to Admission  Medication Sig Dispense Refill   baclofen (LIORESAL) 10 MG tablet Take 1 tablet (10 mg total) by  mouth 3 (three) times daily. (Patient not taking: Reported on 04/12/2023) 30 each 0   ibuprofen (ADVIL) 600 MG tablet Take 1 tablet (600 mg total) by mouth every 6 (six) hours as needed. (Patient not taking: Reported on 04/12/2023) 30 tablet 0   pantoprazole (PROTONIX) 40 MG tablet Take 1 tablet (40 mg total) by mouth daily as needed (stomach pain). Take on empty stomach 30 minutes prior to breakfast. (Patient not taking: Reported on 04/12/2023) 30 tablet 3   triamcinolone cream (KENALOG) 0.1 % Apply topically 2 (two) times daily. (Patient not taking: Reported on 04/12/2023)        Home: Home Living Family/patient expects to be discharged to:: Private residence Living Arrangements: Children Available Help at Discharge: Family, Available 24 hours/day Type of Home: Mobile home Home Access: Stairs to enter [employer] of Steps: 5, ramp Entrance Stairs-Rails: Can reach both Home Layout: One level Bathroom Shower/Tub: Health visitor: Handicapped height Bathroom Accessibility: Yes Home Equipment: None   Functional History: Prior Function Prior Level of Function : Independent/Modified Independent, Working/employed, Driving Mobility Comments: amb no AD home/community distances ADLs Comments: assists adult son with CP  Functional Status:  Mobility: Bed Mobility Overal bed mobility: Needs Assistance Bed Mobility: Supine to Sit Supine to sit: Min assist, Used rails, HOB elevated Sit to supine: Contact guard assist General bed mobility comments: Increased time required to perform. vcs for sequencing and handplacement. slow moving to decrease likihood of dizziness. 122/57(74) prior to sitting up EOB Transfers Overall transfer level: Needs assistance Equipment used:  Rolling walker (2 wheels) Transfers: Sit to/from Stand Sit to Stand: From elevated surface, Mod assist (+1) General transfer comment: pt performed STS 3 x EOB. stood EOB an performed marching in place  and L lateral wt shift due to pt tending to have R lateral lean in standing. pt tolerated standing activity well. BP upon [phone]) Ambulation/Gait Ambulation/Gait assistance: Min assist, Mod assist Gait Distance (Feet): 3 Feet Assistive device: Rolling walker (2 wheels) Gait Pattern/deviations: Step-to pattern, Decreased weight shift to left, Staggering right General Gait Details: pt was able to march in place alternating 20 x prior to ambulating a few feet along EOB to recliner Gait velocity: decreased    ADL: ADL Overall ADL's : Needs assistance/impaired Eating/Feeding: Supervision/ safety, Sitting Grooming: Wash/dry face, Sitting, Supervision/safety Lower Body Dressing: Maximal assistance Lower Body Dressing Details (indicate cue type and reason): CGA to doff one sock, limited by nausea; MAX A to doff other sock and to donn grip socks Toilet Transfer: Moderate assistance, +2 for physical assistance, +2 for safety/equipment Toilet Transfer Details (indicate cue type and reason): step pivot to bsc +2 transfer to bsc, +1 transfer back Toileting- Clothing Manipulation and Hygiene: Maximal assistance, Sit to/from stand Toileting - Clothing Manipulation Details (indicate cue type and reason): continent urine on toilet General ADL Comments: limited by n/v on this date  Cognition: Cognition Overall Cognitive Status: Within Functional Limits for tasks assessed Orientation Level: Oriented X4 Cognition Arousal: Alert Behavior During Therapy: WFL for tasks assessed/performed, Flat affect Overall Cognitive Status: Within Functional Limits for tasks assessed General Comments: Pt is A and O x 4. Extremely pleasant and motivated. Overall limited by fatigue  Physical Exam: Blood pressure 123/67, pulse 64, temperature 98.7 F (37.1 C), temperature source Oral, resp. rate 16, height 5' 9.5" (1.765 m), weight 105 kg, last menstrual period 12/28/2016, SpO2 97%. Physical Exam  Results  for orders placed or performed during the hospital encounter of 04/12/23 (from the past 48 hours)  HIV Antibody (routine testing w rflx)     Status: None   Collection Time: 04/13/23  4:05 AM  Result Value Ref Range   HIV Screen 4th Generation wRfx Non Reactive Non Reactive    Comment: Performed at [facility], [address] [place], [place] [address]   ECHOCARDIOGRAM COMPLETE Result Date: 04/13/2023    ECHOCARDIOGRAM REPORT   Patient Name:   KYLII ENNIS Date of Exam: 04/13/2023 Medical Rec #:  [id number]       Height:       69.5 in Accession #:    [id number]      Weight:       231.5 lb Date of Birth:  May 05, 1965       BSA:          2.209 m Patient Age:    57 years        BP:           113/40 mmHg Patient Gender: F               HR:           87 bpm. Exam Location:  [facility] Procedure: 2D Echo, Cardiac Doppler, Color Doppler and Saline Contrast Bubble            Study Indications:     Stroke  History:         Patient has no prior history of Echocardiogram examinations.  Stroke.  Sonographer:     Mikki Harbor Referring Phys:  [id number] Inetta Fermo LAI Diagnosing Phys: Julien Nordmann MD  Sonographer Comments: Suboptimal subcostal window. IMPRESSIONS  1. Left ventricular ejection fraction, by estimation, is 60 to 65%. The left ventricle has normal function. The left ventricle has no regional wall motion abnormalities. Left ventricular diastolic parameters were normal.  2. Right ventricular systolic function is normal. The right ventricular size is normal. There is normal pulmonary artery systolic pressure. The estimated right ventricular systolic pressure is 33.4 mmHg.  3. Left atrial size was mildly dilated.  4. The mitral valve is normal in structure. No evidence of mitral valve regurgitation. No evidence of mitral stenosis.  5. The aortic valve is normal in structure. Aortic valve regurgitation is not visualized. No aortic stenosis is present.  6. The inferior vena cava is normal in size  with greater than 50% respiratory variability, suggesting right atrial pressure of 3 mmHg.  7. Agitated saline contrast bubble study was negative, with no evidence of any interatrial shunt. FINDINGS  Left Ventricle: Left ventricular ejection fraction, by estimation, is 60 to 65%. The left ventricle has normal function. The left ventricle has no regional wall motion abnormalities. The left ventricular internal cavity size was normal in size. There is  no left ventricular hypertrophy. Left ventricular diastolic parameters were normal. Right Ventricle: The right ventricular size is normal. No increase in right ventricular wall thickness. Right ventricular systolic function is normal. There is normal pulmonary artery systolic pressure. The tricuspid regurgitant velocity is 2.52 m/s, and  with an assumed right atrial pressure of 8 mmHg, the estimated right ventricular systolic pressure is 33.4 mmHg. Left Atrium: Left atrial size was mildly dilated. Right Atrium: Right atrial size was normal in size. Pericardium: There is no evidence of pericardial effusion. Mitral Valve: The mitral valve is normal in structure. No evidence of mitral valve regurgitation. No evidence of mitral valve stenosis. MV peak gradient, 6.9 mmHg. The mean mitral valve gradient is 3.0 mmHg. Tricuspid Valve: The tricuspid valve is normal in structure. Tricuspid valve regurgitation is mild . No evidence of tricuspid stenosis. Aortic Valve: The aortic valve is normal in structure. Aortic valve regurgitation is not visualized. No aortic stenosis is present. Aortic valve mean gradient measures 8.0 mmHg. Aortic valve peak gradient measures 17.1 mmHg. Aortic valve area, by VTI measures 2.50 cm. Pulmonic Valve: The pulmonic valve was normal in structure. Pulmonic valve regurgitation is not visualized. No evidence of pulmonic stenosis. Aorta: The aortic root is normal in size and structure. Venous: The inferior vena cava is normal in size with greater than  50% respiratory variability, suggesting right atrial pressure of 3 mmHg. IAS/Shunts: No atrial level shunt detected by color flow Doppler. Agitated saline contrast was given intravenously to evaluate for intracardiac shunting. Agitated saline contrast bubble study was negative, with no evidence of any interatrial shunt. There  is no evidence of a patent foramen ovale. There is no evidence of an atrial septal defect.  LEFT VENTRICLE PLAX 2D LVIDd:         4.00 cm     Diastology LVIDs:         2.10 cm     LV e' medial:    9.79 cm/s LV PW:         1.10 cm     LV E/e' medial:  11.7 LV IVS:        0.90 cm     LV e' lateral:   10.90 cm/s  LVOT diam:     1.90 cm     LV E/e' lateral: 10.6 LV SV:         96 LV SV Index:   44 LVOT Area:     2.84 cm  LV Volumes (MOD) LV vol d, MOD A2C: 54.9 ml LV vol d, MOD A4C: 54.2 ml LV vol s, MOD A2C: 15.0 ml LV vol s, MOD A4C: 14.4 ml LV SV MOD A2C:     39.9 ml LV SV MOD A4C:     54.2 ml LV SV MOD BP:      40.5 ml RIGHT VENTRICLE RV Basal diam:  2.95 cm RV Mid diam:    2.30 cm RV S prime:     17.60 cm/s TAPSE (M-mode): 3.0 cm LEFT ATRIUM             Index        RIGHT ATRIUM           Index LA diam:        3.60 cm 1.63 cm/m   RA Area:     16.70 cm LA Vol (A2C):   81.5 ml 36.89 ml/m  RA Volume:   40.80 ml  18.47 ml/m LA Vol (A4C):   62.7 ml 28.38 ml/m LA Biplane Vol: 72.6 ml 32.86 ml/m  AORTIC VALVE                     PULMONIC VALVE AV Area (Vmax):    2.26 cm      PV Vmax:       1.24 m/s AV Area (Vmean):   2.27 cm      PV Peak grad:  6.2 mmHg AV Area (VTI):     2.50 cm AV Vmax:           207.00 cm/s AV Vmean:          126.000 cm/s AV VTI:            0.386 m AV Peak Grad:      17.1 mmHg AV Mean Grad:      8.0 mmHg LVOT Vmax:         165.00 cm/s LVOT Vmean:        101.000 cm/s LVOT VTI:          0.340 m LVOT/AV VTI ratio: 0.88  AORTA Ao Root diam: 2.70 cm MITRAL VALVE                TRICUSPID VALVE MV Area (PHT): 4.65 cm     TR Peak grad:   25.4 mmHg MV Area VTI:   2.74 cm     TR  Vmax:        252.00 cm/s MV Peak grad:  6.9 mmHg MV Mean grad:  3.0 mmHg     SHUNTS MV Vmax:       1.31 m/s     Systemic VTI:  0.34 m MV Vmean:      74.2 cm/s    Systemic Diam: 1.90 cm MV Decel Time: 163 msec MV E velocity: 115.00 cm/s MV A velocity: 84.80 cm/s MV E/A ratio:  1.36 Julien Nordmann MD Electronically signed by Julien Nordmann MD Signature Date/Time: 04/13/2023/5:24:37 PM    Final       Blood pressure 123/67, pulse 64, temperature 98.7 F (37.1 C), temperature source Oral, resp. rate 16, height 5' 9.5" (1.765 m), weight 105 kg, last menstrual period 12/28/2016, SpO2 97%.  Medical Problem List and Plan: 1.  Functional deficits secondary to ***  -patient may *** shower  -ELOS/Goals: ***  2.  Antithrombotics: -DVT/anticoagulation:  Pharmaceutical: Lovenox  -antiplatelet therapy: Aspirin and Plavix for three weeks followed by aspirin alone  3. Pain Management: Tylenol as needed  4. Mood/Behavior/Sleep: LCSW to evaluate and provide emotional support  -antipsychotic agents: n/a  5. Neuropsych/cognition: This patient *** capable of making decisions on *** own behalf.  6. Skin/Wound Care: Routine skin care checks   7. Fluids/Electrolytes/Nutrition: Routine Is and Os and follow-up chemistries  9: Hyperlipidemia: continue statin  10: Prediabetes: carb conscience diet and monitor with PCP     ***  @Ima Hafner  Forestine Na, PA-C 04/14/2023

## 2023-04-14 NOTE — Discharge Summary (Incomplete)
Physician Discharge Summary   Brittney Yu  female DOB: [birth year]  QMV:[id number]  PCP: Hillery Aldo, MD  Admit date: 04/12/2023 Discharge date: ***  Admitted From: *** Disposition:  {disposition:18248} Home Health: {yes/no:20286} CODE STATUS: {Palliative Code status:23503}  Discharge Instructions     Ambulatory referral to Neurology   Complete by: As directed    Diet - low sodium heart healthy   Complete by: As directed       Hospital Course:  For full details, please see H&P, progress notes, consult notes and ancillary notes.  Briefly,  ***  Unless noted above, medications under "STOP" list are ones pt was not taking PTA.  Discharge Diagnoses:  Principal Problem:   Acute CVA (cerebrovascular accident) (HCC)   30 Day Unplanned Readmission Risk Score    Flowsheet Row ED to Hosp-Admission (Current) from 04/12/2023 in [facility] 1C MEDICAL TELEMETRY  30 Day Unplanned Readmission Risk Score (%) 7.18 Filed at 04/14/2023 0801       This score is the patient's risk of an unplanned readmission within 30 days of being discharged (0 -100%). The score is based on dignosis, age, lab data, medications, orders, and past utilization.   Low:  0-14.9   Medium: 15-21.9   High: 22-29.9   Extreme: 30 and above         Discharge Instructions:  Allergies as of 04/14/2023   No Known Allergies      Medication List     STOP taking these medications    baclofen 10 MG tablet Commonly known as: LIORESAL   ibuprofen 600 MG tablet Commonly known as: ADVIL   pantoprazole 40 MG tablet Commonly known as: PROTONIX   triamcinolone cream 0.1 % Commonly known as: KENALOG       TAKE these medications    aspirin EC 81 MG tablet Take 1 tablet (81 mg total) by mouth daily. Swallow whole. Start taking on: April 15, 2023   atorvastatin 80 MG tablet Commonly known as: LIPITOR Take 1 tablet (80 mg total) by mouth daily. Start taking on: April 15, 2023   clopidogrel 75 MG tablet Commonly known as: PLAVIX Take 1 tablet (75 mg total) by mouth daily for 21 days. Start taking on: April 15, 2023   meclizine 25 MG tablet Commonly known as: ANTIVERT Take 1 tablet (25 mg total) by mouth 3 (three) times daily as needed for dizziness.          No Known Allergies   The results of significant diagnostics from this hospitalization (including imaging, microbiology, ancillary and laboratory) are listed below for reference.   Consultations:   Procedures/Studies: ECHOCARDIOGRAM COMPLETE Result Date: 04/13/2023    ECHOCARDIOGRAM REPORT   Patient Name:   Brittney Yu Date of Exam: 04/13/2023 Medical Rec #:  [id number]       Height:       69.5 in Accession #:    [id number]      Weight:       231.5 lb Date of Birth:  January 05, 1966       BSA:          2.209 m Patient Age:    57 years        BP:           113/40 mmHg Patient Gender: F               HR:           87 bpm. Exam Location:  [facility] Procedure: 2D Echo, Cardiac Doppler, Color Doppler and Saline Contrast Bubble            Study Indications:     Stroke  History:         Patient has no prior history of Echocardiogram examinations.                  Stroke.  Sonographer:     Mikki Harbor Referring Phys:  [id number] Inetta Fermo Nohlan Burdin Diagnosing Phys: Julien Nordmann MD  Sonographer Comments: Suboptimal subcostal window. IMPRESSIONS  1. Left ventricular ejection fraction, by estimation, is 60 to 65%. The left ventricle has normal function. The left ventricle has no regional wall motion abnormalities. Left ventricular diastolic parameters were normal.  2. Right ventricular systolic function is normal. The right ventricular size is normal. There is normal pulmonary artery systolic pressure. The estimated right ventricular systolic pressure is 33.4 mmHg.  3. Left atrial size was mildly dilated.  4. The mitral valve is normal in structure. No evidence of mitral valve regurgitation. No evidence of mitral stenosis.   5. The aortic valve is normal in structure. Aortic valve regurgitation is not visualized. No aortic stenosis is present.  6. The inferior vena cava is normal in size with greater than 50% respiratory variability, suggesting right atrial pressure of 3 mmHg.  7. Agitated saline contrast bubble study was negative, with no evidence of any interatrial shunt. FINDINGS  Left Ventricle: Left ventricular ejection fraction, by estimation, is 60 to 65%. The left ventricle has normal function. The left ventricle has no regional wall motion abnormalities. The left ventricular internal cavity size was normal in size. There is  no left ventricular hypertrophy. Left ventricular diastolic parameters were normal. Right Ventricle: The right ventricular size is normal. No increase in right ventricular wall thickness. Right ventricular systolic function is normal. There is normal pulmonary artery systolic pressure. The tricuspid regurgitant velocity is 2.52 m/s, and  with an assumed right atrial pressure of 8 mmHg, the estimated right ventricular systolic pressure is 33.4 mmHg. Left Atrium: Left atrial size was mildly dilated. Right Atrium: Right atrial size was normal in size. Pericardium: There is no evidence of pericardial effusion. Mitral Valve: The mitral valve is normal in structure. No evidence of mitral valve regurgitation. No evidence of mitral valve stenosis. MV peak gradient, 6.9 mmHg. The mean mitral valve gradient is 3.0 mmHg. Tricuspid Valve: The tricuspid valve is normal in structure. Tricuspid valve regurgitation is mild . No evidence of tricuspid stenosis. Aortic Valve: The aortic valve is normal in structure. Aortic valve regurgitation is not visualized. No aortic stenosis is present. Aortic valve mean gradient measures 8.0 mmHg. Aortic valve peak gradient measures 17.1 mmHg. Aortic valve area, by VTI measures 2.50 cm. Pulmonic Valve: The pulmonic valve was normal in structure. Pulmonic valve regurgitation is not  visualized. No evidence of pulmonic stenosis. Aorta: The aortic root is normal in size and structure. Venous: The inferior vena cava is normal in size with greater than 50% respiratory variability, suggesting right atrial pressure of 3 mmHg. IAS/Shunts: No atrial level shunt detected by color flow Doppler. Agitated saline contrast was given intravenously to evaluate for intracardiac shunting. Agitated saline contrast bubble study was negative, with no evidence of any interatrial shunt. There  is no evidence of a patent foramen ovale. There is no evidence of an atrial septal defect.  LEFT VENTRICLE PLAX 2D LVIDd:         4.00 cm     Diastology LVIDs:  2.10 cm     LV e' medial:    9.79 cm/s LV PW:         1.10 cm     LV E/e' medial:  11.7 LV IVS:        0.90 cm     LV e' lateral:   10.90 cm/s LVOT diam:     1.90 cm     LV E/e' lateral: 10.6 LV SV:         96 LV SV Index:   44 LVOT Area:     2.84 cm  LV Volumes (MOD) LV vol d, MOD A2C: 54.9 ml LV vol d, MOD A4C: 54.2 ml LV vol s, MOD A2C: 15.0 ml LV vol s, MOD A4C: 14.4 ml LV SV MOD A2C:     39.9 ml LV SV MOD A4C:     54.2 ml LV SV MOD BP:      40.5 ml RIGHT VENTRICLE RV Basal diam:  2.95 cm RV Mid diam:    2.30 cm RV S prime:     17.60 cm/s TAPSE (M-mode): 3.0 cm LEFT ATRIUM             Index        RIGHT ATRIUM           Index LA diam:        3.60 cm 1.63 cm/m   RA Area:     16.70 cm LA Vol (A2C):   81.5 ml 36.89 ml/m  RA Volume:   40.80 ml  18.47 ml/m LA Vol (A4C):   62.7 ml 28.38 ml/m LA Biplane Vol: 72.6 ml 32.86 ml/m  AORTIC VALVE                     PULMONIC VALVE AV Area (Vmax):    2.26 cm      PV Vmax:       1.24 m/s AV Area (Vmean):   2.27 cm      PV Peak grad:  6.2 mmHg AV Area (VTI):     2.50 cm AV Vmax:           207.00 cm/s AV Vmean:          126.000 cm/s AV VTI:            0.386 m AV Peak Grad:      17.1 mmHg AV Mean Grad:      8.0 mmHg LVOT Vmax:         165.00 cm/s LVOT Vmean:        101.000 cm/s LVOT VTI:          0.340 m LVOT/AV VTI  ratio: 0.88  AORTA Ao Root diam: 2.70 cm MITRAL VALVE                TRICUSPID VALVE MV Area (PHT): 4.65 cm     TR Peak grad:   25.4 mmHg MV Area VTI:   2.74 cm     TR Vmax:        252.00 cm/s MV Peak grad:  6.9 mmHg MV Mean grad:  3.0 mmHg     SHUNTS MV Vmax:       1.31 m/s     Systemic VTI:  0.34 m MV Vmean:      74.2 cm/s    Systemic Diam: 1.90 cm MV Decel Time: 163 msec MV E velocity: 115.00 cm/s MV A velocity: 84.80 cm/s MV E/A ratio:  1.36 Julien Nordmann MD  Electronically signed by Julien Nordmann MD Signature Date/Time: 04/13/2023/5:24:37 PM    Final    CT ANGIO HEAD NECK W WO CM Result Date: 04/12/2023 CLINICAL DATA:  Sudden onset weakness and dizziness with unsteadiness and vomiting. EXAM: CT ANGIOGRAPHY HEAD AND NECK WITH AND WITHOUT CONTRAST TECHNIQUE: Multidetector CT imaging of the head and neck was performed using the standard protocol during bolus administration of intravenous contrast. Multiplanar CT image reconstructions and MIPs were obtained to evaluate the vascular anatomy. Carotid stenosis measurements (when applicable) are obtained utilizing NASCET criteria, using the distal internal carotid diameter as the denominator. RADIATION DOSE REDUCTION: This exam was performed according to the departmental dose-optimization program which includes automated exposure control, adjustment of the mA and/or kV according to patient size and/or use of iterative reconstruction technique. CONTRAST:  75mL OMNIPAQUE IOHEXOL 350 MG/ML SOLN COMPARISON:  Brain MRI from earlier today FINDINGS: CTA NECK FINDINGS Aortic arch: 2 vessel arch with mild atheromatous calcification. No acute finding or dilatation Right carotid system: Vessels are smoothly contoured and widely patent. No significant atheromatous change. Left carotid system: Diffusely smaller caliber than the right associated with variant circle-of-Willis anatomy. No focal stenosis, beading, or dissection. Vertebral arteries: No proximal subclavian stenosis.  No flow seen in the right vertebral artery throughout its cervical course. The left vertebral artery is likely dominant based on the vertebral foramen size and is smoothly contoured with diffuse patency. Skeleton: No acute or aggressive finding Other neck: Some frothy mucus appearance suggested in the trachea. 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 Upper chest: No acute finding Review of the MIP images confirms the above findings CTA HEAD FINDINGS Anterior circulation: Mild calcification of the cavernous carotids. No branch occlusion, beading, or aneurysm. Aplastic left A1 segment. There is focal moderate narrowing at the right M1 origin,. Negative for aneurysm or vascular malformation. Posterior circulation: Faint flow in the right V4 segment which is likely reconstituted from the basilar. There is an enhancing right AICA. The right PICA origin is not seen but there is faint enhancement in the region of the right PICA. Smooth and widely patent basilar. No PCA stenosis or branch occlusion. Venous sinuses: Unremarkable Anatomic variants: As above Review of the MIP images confirms the above findings IMPRESSION: 1. 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 No clear vasculopathy or atherosclerosis in the neck, correlate for dissection risk factors. 2. 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 Mild atheromatous calcification the cavernous carotids. 3. 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 Electronically Signed   By: Tiburcio Pea M.D.   On: 04/12/2023 08:32   MR BRAIN WO CONTRAST Result Date: 04/12/2023 CLINICAL DATA:  Dizziness EXAM: MRI HEAD WITHOUT CONTRAST TECHNIQUE: Multiplanar, multiecho pulse sequences of the brain and surrounding structures were obtained without intravenous contrast. COMPARISON:  Head CT from earlier today FINDINGS: Brain: Small acute  infarcts in the right lateral medulla and scattered in the right cerebellum. No acute hemorrhage, hydrocephalus, mass, or collection. No pre-existing infarct or chronic white matter disease. Vascular: A right vertebral flow void is not visualized in the upper cervical spine and lower V4 segment. Skull and upper cervical spine: Normal marrow signal Sinuses/Orbits: Gaze to the right IMPRESSION: Small acute infarcts in the right lateral medulla and right cerebellum. Abnormal right vertebral flow void, recommend CTA of the head and neck. Electronically Signed   By: Audry Riles.D.  On: 04/12/2023 05:21   CT Head Wo Contrast Result Date: 04/12/2023 CLINICAL DATA:  headache, dizziness EXAM: CT HEAD WITHOUT CONTRAST TECHNIQUE: Contiguous axial images were obtained from the base of the skull through the vertex without intravenous contrast. RADIATION DOSE REDUCTION: This exam was performed according to the departmental dose-optimization program which includes automated exposure control, adjustment of the mA and/or kV according to patient size and/or use of iterative reconstruction technique. COMPARISON:  None Available. FINDINGS: Brain: No evidence of acute infarction, hemorrhage, hydrocephalus, extra-axial collection or mass lesion/mass effect. Vascular: No hyperdense vessel. Skull: No acute fracture. Sinuses/Orbits: Clear sinuses.  No acute orbital findings. Other: No mastoid effusions. IMPRESSION: No evidence of acute intracranial abnormality. Electronically Signed   By: Feliberto Harts M.D.   On: 04/12/2023 02:25      Labs: BNP (last 3 results) No results for input(s): "BNP" in the last 8760 hours. Basic Metabolic Panel: Recent Labs  Lab 04/12/23 0135  NA 141  K 3.7  CL 104  CO2 23  GLUCOSE 178*  BUN 18  CREATININE 0.70  CALCIUM 8.8*   Liver Function Tests: No results for input(s): "AST", "ALT", "ALKPHOS", "BILITOT", "PROT", "ALBUMIN" in the last 168 hours. No results for input(s):  "LIPASE", "AMYLASE" in the last 168 hours. No results for input(s): "AMMONIA" in the last 168 hours. CBC: Recent Labs  Lab 04/12/23 0135  WBC 6.1  NEUTROABS 3.7  HGB 12.5  HCT 39.2  MCV 87.3  PLT 319   Cardiac Enzymes: No results for input(s): "CKTOTAL", "CKMB", "CKMBINDEX", "TROPONINI" in the last 168 hours. BNP: Invalid input(s): "POCBNP" CBG: Recent Labs  Lab 04/12/23 0125  GLUCAP 165*   D-Dimer No results for input(s): "DDIMER" in the last 72 hours. Hgb A1c Recent Labs    04/12/23 0135  HGBA1C 5.7*   Lipid Profile Recent Labs    04/12/23 0142  CHOL 201*  HDL 79  LDLCALC 113*  TRIG 46  CHOLHDL 2.5   Thyroid function studies Recent Labs    04/12/23 0142  TSH 1.100   Anemia work up No results for input(s): "VITAMINB12", "FOLATE", "FERRITIN", "TIBC", "IRON", "RETICCTPCT" in the last 72 hours. Urinalysis No results found for: "COLORURINE", "APPEARANCEUR", "LABSPEC", "PHURINE", "GLUCOSEU", "HGBUR", "BILIRUBINUR", "KETONESUR", "PROTEINUR", "UROBILINOGEN", "NITRITE", "LEUKOCYTESUR" Sepsis Labs Recent Labs  Lab 04/12/23 0135  WBC 6.1   Microbiology No results found for this or any previous visit (from the past 240 hours).   Total time spend on discharging this patient, including the last patient exam, discussing the hospital stay, instructions for ongoing care as it relates to all pertinent caregivers, as well as preparing the medical discharge records, prescriptions, and/or referrals as applicable, is *** minutes.    Darlin Priestly, MD  [facility] 04/14/2023, 11:32 AM

## 2023-04-14 NOTE — H&P (Signed)
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  She was hemodynamically stable and labs were unremarkable.  She has no significant medical history.  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  Also showed abnormal right vertebral flow void and recommended CTA of head and neck.  Admitted to hospitalist service and neurology consulted.  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  She was given Plavix 300 mg load and is now on daily aspirin 81 mg and Plavix 75 mg.  2D echo with EF of 60 to 65%.  Hemoglobin A1c 5.7%.  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  No evidence of embolic source.  She was started on atorvastatin 80 mg nightly.  Required min assist for mobility. Right lateral lean noted. 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   Review of Systems  Constitutional:  Negative for chills and fever.  HENT:  Negative for hearing loss.   Eyes:  Positive for double vision. Negative for blurred vision.  Respiratory:  Negative for cough and shortness of breath.   Cardiovascular:  Negative for chest pain and palpitations.  Gastrointestinal:  Positive for constipation, nausea and vomiting. Negative for abdominal pain.  Genitourinary:  Positive for urgency. Negative for dysuria.  Neurological:  Positive for dizziness, tingling, sensory change, speech change and focal weakness.  Negative for headaches.  Psychiatric/Behavioral:  The patient does not have insomnia.    No past medical history on file. Past Surgical History:  Procedure Laterality Date   SHOULDER SURGERY Left    Family History  Problem Relation Age of Onset   Hypertension Mother    Breast cancer Father    Social History:  reports that she has never smoked. She has never used smokeless tobacco. She reports that she does not drink alcohol and does not use drugs. Allergies: No Known Allergies Medications Prior to Admission  Medication Sig Dispense Refill   [START ON 04/15/2023] aspirin EC 81 MG tablet Take 1 tablet (81 mg total) by mouth daily. Swallow whole.     [START ON 04/15/2023] atorvastatin (LIPITOR) 80 MG tablet Take 1 tablet (80 mg total) by mouth daily.     [START ON 04/15/2023] clopidogrel (PLAVIX) 75 MG tablet Take 1 tablet (75 mg total) by mouth daily for 21 days.     meclizine (ANTIVERT) 25 MG tablet Take 1 tablet (25 mg total) by mouth 3 (three) times daily as needed for dizziness.     Home: Home Living Family/patient expects to be discharged to:: Private residence Living Arrangements: Children Available Help at Discharge: Family, Available 24 hours/day Type of Home: Mobile home Home Access: Stairs to enter [employer] of Steps: 5, ramp Entrance Stairs-Rails: Can reach both Home Layout: One level Bathroom Shower/Tub: Health visitor: Handicapped height Bathroom Accessibility: Yes Home Equipment: None   Functional History: Prior Function Prior Level of Function : Independent/Modified Independent, Working/employed, Driving Mobility Comments: amb no AD home/community distances ADLs Comments:  assists adult son with CP   Functional Status:  Mobility: Bed Mobility Overal bed mobility: Needs Assistance Bed Mobility: Supine to Sit Supine to sit: Min assist, Used rails, HOB elevated Sit to supine: Contact guard assist General bed mobility comments:  Increased time required to perform. vcs for sequencing and handplacement. slow moving to decrease likihood of dizziness. 122/57(74) prior to sitting up EOB Transfers Overall transfer level: Needs assistance Equipment used: Rolling walker (2 wheels) Transfers: Sit to/from Stand Sit to Stand: From elevated surface, Mod assist (+1) General transfer comment: pt performed STS 3 x EOB. stood EOB an performed marching in place and L lateral wt shift due to pt tending to have R lateral lean in standing. pt tolerated standing activity well. BP upon [phone] Ambulation/Gait Ambulation/Gait assistance: Min assist, Mod assist Gait Distance (Feet): 3 Feet Assistive device: Rolling walker (2 wheels) Gait Pattern/deviations: Step-to pattern, Decreased weight shift to left, Staggering right General Gait Details: pt was able to march in place alternating 20 x prior to ambulating a few feet along EOB to recliner Gait velocity: decreased   ADL: ADL Overall ADL's : Needs assistance/impaired Eating/Feeding: Supervision/ safety, Sitting Grooming: Wash/dry face, Sitting, Supervision/safety Lower Body Dressing: Maximal assistance Lower Body Dressing Details (indicate cue type and reason): CGA to doff one sock, limited by nausea; MAX A to doff other sock and to donn grip socks Toilet Transfer: Moderate assistance, +2 for physical assistance, +2 for safety/equipment Toilet Transfer Details (indicate cue type and reason): step pivot to bsc +2 transfer to bsc, +1 transfer back Toileting- Clothing Manipulation and Hygiene: Maximal assistance, Sit to/from stand Toileting - Clothing Manipulation Details (indicate cue type and reason): continent urine on toilet General ADL Comments: limited by n/v on this date   Cognition: Cognition Overall Cognitive Status: Within Functional Limits for tasks assessed Orientation Level: Oriented X4 Cognition Arousal: Alert Behavior During Therapy: WFL for tasks  assessed/performed, Flat affect Overall Cognitive Status: Within Functional Limits for tasks assessed General Comments: Pt is A and O x 4. Extremely pleasant and motivated. Overall limited by fatigue  Physical Exam: Last menstrual period 12/28/2016. Physical Exam  Constitutional: No apparent distress. Appropriate appearance for age. +obese HENT: No JVD. Neck Supple. Trachea midline. Atraumatic, normocephalic. Eyes: PERRLA. EOMI. Visual fields grossly intact.  + Slow beating nystagmus on leftward gaze  Cardiovascular: RRR, no murmurs/rub/gallops. No Edema. Peripheral pulses 2+  Respiratory: CTAB. No rales, rhonchi, or wheezing. On RA.  Abdomen: + bowel sounds, normoactive. No distention or tenderness.   Skin: C/D/I. No apparent lesions. A few gauze dressings taped on BL UE.  MSK:      No apparent deformity. Full AROM all 4 extremities.        Neurologic exam:  Cognition: AAO to person, place, time and event. Follows all commands.  Language: Fluent, Mild wordfinding difficulty. Mild dysarthria. Names 3/3 objects correctly with extended time.  Memory: Recalls 3/3 objects at 5 minutes.  Insight: Good  insight into current condition.  Mood: Pleasant affect, appropriate mood.  Sensation: To light touch reduced in distal LUE extending to the bicep, and distal LLE extending to the mid thigh. Reflexes: 2+ in BL UE and LEs. Negative Hoffman's and babinski signs bilaterally.  CN: + L tongue deviation Coordination: No apparent tremors. + LLE > LUE ataxia.  Spasticity: MAS 0 in all extremities.       Strength:                RUE: 5/5 SA, 5/5 EF,  5/5 EE, 5/5 WE, 5/5 FF, 5/5 FA                LUE:  5-/5 SA, 5-/5 EF, 5-/5 EE, 5-/5 WE, 5-/5 FF, 5-/5 FA                RLE: 5/5 HF, 5/5 KE, 5/5  DF, 5/5  EHL, 5/5  PF                 LLE:  5/5 HF, 5/5 KE, 5/5  DF, 5/5  EHL, 5/5  PF     Results for orders placed or performed during the hospital encounter of 04/12/23 (from the past 48 hours)  HIV  Antibody (routine testing w rflx)     Status: None   Collection Time: 04/13/23  4:05 AM  Result Value Ref Range   HIV Screen 4th Generation wRfx Non Reactive Non Reactive    Comment: Performed at [facility], [address] [place], [place] [address]   ECHOCARDIOGRAM COMPLETE Result Date: 04/13/2023    ECHOCARDIOGRAM REPORT   Patient Name:   Brittney Yu Date of Exam: 04/13/2023 Medical Rec #:  [id number]       Height:       69.5 in Accession #:    [id number]      Weight:       231.5 lb Date of Birth:  February 21, 1966       BSA:          2.209 m Patient Age:    57 years        BP:           113/40 mmHg Patient Gender: F               HR:           87 bpm. Exam Location:  [facility] Procedure: 2D Echo, Cardiac Doppler, Color Doppler and Saline Contrast Bubble            Study Indications:     Stroke  History:         Patient has no prior history of Echocardiogram examinations.                  Stroke.  Sonographer:     Mikki Harbor Referring Phys:  [id number] Inetta Fermo LAI Diagnosing Phys: Julien Nordmann MD  Sonographer Comments: Suboptimal subcostal window. IMPRESSIONS  1. Left ventricular ejection fraction, by estimation, is 60 to 65%. The left ventricle has normal function. The left ventricle has no regional wall motion abnormalities. Left ventricular diastolic parameters were normal.  2. Right ventricular systolic function is normal. The right ventricular size is normal. There is normal pulmonary artery systolic pressure. The estimated right ventricular systolic pressure is 33.4 mmHg.  3. Left atrial size was mildly dilated.  4. The mitral valve is normal in structure. No evidence of mitral valve regurgitation. No evidence of mitral stenosis.  5. The aortic valve is normal in structure. Aortic valve regurgitation is not visualized. No aortic stenosis is present.  6. The inferior vena cava is normal in size with greater than 50% respiratory variability, suggesting right atrial pressure of 3 mmHg.  7. Agitated  saline contrast bubble study was negative, with no evidence of any interatrial shunt. FINDINGS  Left Ventricle: Left ventricular ejection fraction, by estimation, is 60 to 65%. The left ventricle has normal function. The left ventricle has no regional wall motion abnormalities. The left ventricular internal cavity size was  normal in size. There is  no left ventricular hypertrophy. Left ventricular diastolic parameters were normal. Right Ventricle: The right ventricular size is normal. No increase in right ventricular wall thickness. Right ventricular systolic function is normal. There is normal pulmonary artery systolic pressure. The tricuspid regurgitant velocity is 2.52 m/s, and  with an assumed right atrial pressure of 8 mmHg, the estimated right ventricular systolic pressure is 33.4 mmHg. Left Atrium: Left atrial size was mildly dilated. Right Atrium: Right atrial size was normal in size. Pericardium: There is no evidence of pericardial effusion. Mitral Valve: The mitral valve is normal in structure. No evidence of mitral valve regurgitation. No evidence of mitral valve stenosis. MV peak gradient, 6.9 mmHg. The mean mitral valve gradient is 3.0 mmHg. Tricuspid Valve: The tricuspid valve is normal in structure. Tricuspid valve regurgitation is mild . No evidence of tricuspid stenosis. Aortic Valve: The aortic valve is normal in structure. Aortic valve regurgitation is not visualized. No aortic stenosis is present. Aortic valve mean gradient measures 8.0 mmHg. Aortic valve peak gradient measures 17.1 mmHg. Aortic valve area, by VTI measures 2.50 cm. Pulmonic Valve: The pulmonic valve was normal in structure. Pulmonic valve regurgitation is not visualized. No evidence of pulmonic stenosis. Aorta: The aortic root is normal in size and structure. Venous: The inferior vena cava is normal in size with greater than 50% respiratory variability, suggesting right atrial pressure of 3 mmHg. IAS/Shunts: No atrial level  shunt detected by color flow Doppler. Agitated saline contrast was given intravenously to evaluate for intracardiac shunting. Agitated saline contrast bubble study was negative, with no evidence of any interatrial shunt. There  is no evidence of a patent foramen ovale. There is no evidence of an atrial septal defect.  LEFT VENTRICLE PLAX 2D LVIDd:         4.00 cm     Diastology LVIDs:         2.10 cm     LV e' medial:    9.79 cm/s LV PW:         1.10 cm     LV E/e' medial:  11.7 LV IVS:        0.90 cm     LV e' lateral:   10.90 cm/s LVOT diam:     1.90 cm     LV E/e' lateral: 10.6 LV SV:         96 LV SV Index:   44 LVOT Area:     2.84 cm  LV Volumes (MOD) LV vol d, MOD A2C: 54.9 ml LV vol d, MOD A4C: 54.2 ml LV vol s, MOD A2C: 15.0 ml LV vol s, MOD A4C: 14.4 ml LV SV MOD A2C:     39.9 ml LV SV MOD A4C:     54.2 ml LV SV MOD BP:      40.5 ml RIGHT VENTRICLE RV Basal diam:  2.95 cm RV Mid diam:    2.30 cm RV S prime:     17.60 cm/s TAPSE (M-mode): 3.0 cm LEFT ATRIUM             Index        RIGHT ATRIUM           Index LA diam:        3.60 cm 1.63 cm/m   RA Area:     16.70 cm LA Vol (A2C):   81.5 ml 36.89 ml/m  RA Volume:   40.80 ml  18.47 ml/m LA Vol (A4C):  62.7 ml 28.38 ml/m LA Biplane Vol: 72.6 ml 32.86 ml/m  AORTIC VALVE                     PULMONIC VALVE AV Area (Vmax):    2.26 cm      PV Vmax:       1.24 m/s AV Area (Vmean):   2.27 cm      PV Peak grad:  6.2 mmHg AV Area (VTI):     2.50 cm AV Vmax:           207.00 cm/s AV Vmean:          126.000 cm/s AV VTI:            0.386 m AV Peak Grad:      17.1 mmHg AV Mean Grad:      8.0 mmHg LVOT Vmax:         165.00 cm/s LVOT Vmean:        101.000 cm/s LVOT VTI:          0.340 m LVOT/AV VTI ratio: 0.88  AORTA Ao Root diam: 2.70 cm MITRAL VALVE                TRICUSPID VALVE MV Area (PHT): 4.65 cm     TR Peak grad:   25.4 mmHg MV Area VTI:   2.74 cm     TR Vmax:        252.00 cm/s MV Peak grad:  6.9 mmHg MV Mean grad:  3.0 mmHg     SHUNTS MV Vmax:        1.31 m/s     Systemic VTI:  0.34 m MV Vmean:      74.2 cm/s    Systemic Diam: 1.90 cm MV Decel Time: 163 msec MV E velocity: 115.00 cm/s MV A velocity: 84.80 cm/s MV E/A ratio:  1.36 Julien Nordmann MD Electronically signed by Julien Nordmann MD Signature Date/Time: 04/13/2023/5:24:37 PM    Final       Last menstrual period 12/28/2016.  Medical Problem List and Plan: 1. 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  Antithrombotics: -DVT/anticoagulation:  Pharmaceutical: Lovenox  -antiplatelet therapy: Aspirin and Plavix for three weeks followed by aspirin alone  3. Pain Management: Tylenol as needed  4. Mood/Behavior/Sleep: LCSW to evaluate and provide emotional support  -antipsychotic agents: n/a  5. Neuropsych/cognition: This patient is capable of making decisions on her own behalf.  6. Skin/Wound Care: Routine skin care checks   7. 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 Added PRN  meclizine 25 mg QID  @Sandra  Setzer, 04/14/23  I have examined the patient independently and edited the note for HPI, ROS, exam, assessment, and plan as appropriate. I am in agreement with the above recommendations.   Angelina Sheriff, DO 04/14/2023

## 2023-04-14 NOTE — Plan of Care (Signed)
  Problem: Education: Goal: Knowledge of disease or condition will improve Outcome: Adequate for Discharge Goal: Knowledge of secondary prevention will improve (MUST DOCUMENT ALL) Outcome: Adequate for Discharge Goal: Knowledge of patient specific risk factors will improve (DELETE if not current risk factor) Outcome: Adequate for Discharge   Problem: Ischemic Stroke/TIA Tissue Perfusion: Goal: Complications of ischemic stroke/TIA will be minimized Outcome: Adequate for Discharge   Problem: Coping: Goal: Will verbalize positive feelings about self Outcome: Adequate for Discharge Goal: Will identify appropriate support needs Outcome: Adequate for Discharge   Problem: Health Behavior/Discharge Planning: Goal: Ability to manage health-related needs will improve Outcome: Adequate for Discharge Goal: Goals will be collaboratively established with patient/family Outcome: Adequate for Discharge   Problem: Self-Care: Goal: Ability to participate in self-care as condition permits will improve Outcome: Adequate for Discharge Goal: Verbalization of feelings and concerns over difficulty with self-care will improve Outcome: Adequate for Discharge Goal: Ability to communicate needs accurately will improve Outcome: Adequate for Discharge   Problem: Nutrition: Goal: Risk of aspiration will decrease Outcome: Adequate for Discharge Goal: Dietary intake will improve Outcome: Adequate for Discharge   Problem: Acute Rehab PT Goals(only PT should resolve) Goal: Pt Will Go Supine/Side To Sit Outcome: Adequate for Discharge Goal: Patient Will Transfer Sit To/From Stand Outcome: Adequate for Discharge Goal: Pt Will Ambulate Outcome: Adequate for Discharge Goal: Pt Will Go Up/Down Stairs Outcome: Adequate for Discharge   Problem: Education: Goal: Knowledge of General Education information will improve Description: Including pain rating scale, medication(s)/side effects and non-pharmacologic  comfort measures Outcome: Adequate for Discharge   Problem: Health Behavior/Discharge Planning: Goal: Ability to manage health-related needs will improve Outcome: Adequate for Discharge   Problem: Clinical Measurements: Goal: Ability to maintain clinical measurements within normal limits will improve Outcome: Adequate for Discharge Goal: Will remain free from infection Outcome: Adequate for Discharge Goal: Diagnostic test results will improve Outcome: Adequate for Discharge Goal: Respiratory complications will improve Outcome: Adequate for Discharge Goal: Cardiovascular complication will be avoided Outcome: Adequate for Discharge   Problem: Activity: Goal: Risk for activity intolerance will decrease Outcome: Adequate for Discharge   Problem: Nutrition: Goal: Adequate nutrition will be maintained Outcome: Adequate for Discharge   Problem: Coping: Goal: Level of anxiety will decrease Outcome: Adequate for Discharge   Problem: Elimination: Goal: Will not experience complications related to bowel motility Outcome: Adequate for Discharge Goal: Will not experience complications related to urinary retention Outcome: Adequate for Discharge   Problem: Pain Managment: Goal: General experience of comfort will improve and/or be controlled Outcome: Adequate for Discharge   Problem: Safety: Goal: Ability to remain free from injury will improve Outcome: Adequate for Discharge   Problem: Skin Integrity: Goal: Risk for impaired skin integrity will decrease Outcome: Adequate for Discharge

## 2023-04-14 NOTE — Progress Notes (Signed)
PMR Admission Coordinator Pre-Admission Assessment   Patient: Brittney Yu is an 58 y.o., female MRN: [id number] DOB: 04/21/1965 Height: 5' 9.5" (176.5 cm) Weight: 105 kg   Insurance Information HMO:     PPO:      PCP:      IPA:      80/20:      OTHER:  PRIMARY: [place] [employer] Healthy Blue      Policy#: [id number]       Subscriber: pt CM Name:       Phone#: [phone]     Fax#: [phone] Pre-Cert#: [id number] auth for CIR from 04/14/23 to 04/20/23 with [place] Medicaid Healthy Blue with updates due to fax [phone] (per case management department) on 04/21/23      Employer: Not employed Benefits:  Phone #: [phone]     Name: Verified on 04/13/23 Eff. Date: 02/11/22 through 05/12/23     Deduct: $0      Out of Pocket Max: $0      Life Max: n/a CIR: 100%      SNF: 100% Outpatient:      Co-Pay: $4 copay/visit Home Health:  100%     Co-Pay:  DME:   100%    Co-Pay:  Providers: in network  SECONDARY:       Policy#:      Phone#:    Artist:       Phone#:    The Data processing manager" for patients in Inpatient Rehabilitation Facilities with attached "Privacy Act Statement-Health Care Records" was provided and verbally reviewed with: Patient and Family   Emergency Contact Information Contact Information       Name Relation Home Work Mobile    [place] D Sister [phone]             Other Contacts   None on File        Current Medical History  Patient Admitting Diagnosis: R CVA    History of Present Illness: Pt is a 59 y/o female with no significant PMH who presented to [facility] on 04/12/23 with c/o dizziness and weakness.  In ED, vitals reassuring and labs unremarkable.  MRI brain showed small acute infarcts in the right lateral medulla and right cerebellum.  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  Recommendations from neurology were for aspirin and plavix for 3 weeks followed by aspirin alone.  SLP has not evaluated patient yet.  Therapy  evaluations were completed and pt was recommended for CIR.    Complete NIHSS TOTAL: 2   Patient's medical record from [facility] has been reviewed by the rehabilitation admission coordinator and physician.   Past Medical History  History reviewed. No pertinent past medical history.       Has the patient had major surgery during 100 days prior to admission? No   Family History   family history includes Breast cancer in her father; Hypertension in her mother.   Current Medications  Current Medications    Current Facility-Administered Medications:    acetaminophen (TYLENOL) tablet 1,000 mg, 1,000 mg, Oral, TID PRN, Darlin Priestly, MD, 1,000 mg at 04/14/23 3557   aspirin EC tablet 81 mg, 81 mg, Oral, Daily, Rejeana Brock, MD, 81 mg at 04/14/23 0815   atorvastatin (LIPITOR) tablet 80 mg, 80 mg, Oral, Daily, Rejeana Brock, MD, 80 mg at 04/14/23 0815   [COMPLETED] clopidogrel (PLAVIX) tablet 300 mg, 300 mg, Oral, Once, 300 mg at 04/12/23 2024 **AND** clopidogrel (PLAVIX) tablet 75 mg, 75 mg, Oral, Daily, Amada Jupiter,  Hardin Negus, MD, 75 mg at 04/14/23 0815   enoxaparin (LOVENOX) injection 40 mg, 40 mg, Subcutaneous, Q24H, Darlin Priestly, MD, 40 mg at 04/14/23 9629   meclizine (ANTIVERT) tablet 25 mg, 25 mg, Oral, TID PRN, Darlin Priestly, MD, 25 mg at 04/14/23 0826   ondansetron [facility]) injection 4 mg, 4 mg, Intravenous, Q6H PRN, Darlin Priestly, MD, 4 mg at 04/12/23 2311     Patients Current Diet:  Diet Order                  Diet - low sodium heart healthy             Diet Heart Room service appropriate? Yes; Fluid consistency: Thin  Diet effective now                         Precautions / Restrictions Precautions Precautions: Fall Restrictions Weight Bearing Restrictions Per Provider Order: No    Has the patient had 2 or more falls or a fall with injury in the past year? 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 Independent   Indoor Mobility: Did the patient need assistance with walking from room to room (with or without device)? Independent   Stairs: Did the patient need assistance with internal or external stairs (with or without device)? Independent   Functional Cognition: Did the patient need help planning regular tasks such as shopping or remembering to take medications? Independent   Patient Information Are you of Hispanic, Latino/a,or Spanish origin?: A. No, not of Hispanic, Latino/a, or Spanish origin What is your race?: B. Black or African American Do you need or want an interpreter to communicate with a doctor or health care staff?: 0. No   Patient's Response To:  Health Literacy and Transportation Is the patient able to respond to health literacy and transportation needs?: Yes Health Literacy - How often do you need to have someone help you when you read instructions, pamphlets, or other written material from your doctor or pharmacy?: Never In the past 12 months, has lack of transportation kept you from medical appointments or from getting medications?: No In the past 12 months, has lack of transportation kept you from meetings, work, or from getting things needed for daily living?: No   Home Assistive Devices / Equipment Home Equipment: None   Prior Device Use: Indicate devices/aids used by the patient prior to current illness, exacerbation or injury? None of the above   Current Functional Level Cognition   Overall Cognitive Status: Within Functional Limits for tasks assessed Orientation Level: Oriented X4 General Comments: Pt is A and O x 4. Extremely pleasant and motivated. Overall limited by fatigue    Extremity Assessment (includes Sensation/Coordination)   Upper Extremity Assessment: Right hand dominant, RUE deficits/detail RUE Deficits /  Details: mild deficits in R hand FMC/dexterity, will continue to assess; A/PROM grossly WFL, sensation appears grossly WFL however will continue toassess RUE Coordination: decreased fine motor  Lower Extremity Assessment: Defer to PT evaluation     ADLs   Overall ADL's : Needs assistance/impaired Eating/Feeding: Supervision/ safety, Sitting Grooming: Wash/dry face, Sitting, Supervision/safety Lower Body Dressing: Maximal assistance Lower Body Dressing Details (indicate cue type and reason): CGA to doff one sock, limited by nausea; MAX A to doff other sock  and to donn grip socks Toilet Transfer: Moderate assistance, +2 for physical assistance, +2 for safety/equipment Toilet Transfer Details (indicate cue type and reason): step pivot to bsc +2 transfer to bsc, +1 transfer back Toileting- Clothing Manipulation and Hygiene: Maximal assistance, Sit to/from stand Toileting - Clothing Manipulation Details (indicate cue type and reason): continent urine on toilet General ADL Comments: limited by n/v on this date     Mobility   Overal bed mobility: Needs Assistance Bed Mobility: Supine to Sit Supine to sit: Min assist, Used rails, HOB elevated Sit to supine: Contact guard assist General bed mobility comments: Increased time required to perform. vcs for sequencing and handplacement. slow moving to decrease likihood of dizziness. 122/57(74) prior to sitting up EOB     Transfers   Overall transfer level: Needs assistance Equipment used: Rolling walker (2 wheels) Transfers: Sit to/from Stand Sit to Stand: From elevated surface, Mod assist (+1) General transfer comment: pt performed STS 3 x EOB. stood EOB an performed marching in place and L lateral wt shift due to pt tending to have R lateral lean in standing. pt tolerated standing activity well. BP upon [phone])     Ambulation / Gait / Stairs / Wheelchair Mobility   Ambulation/Gait Ambulation/Gait assistance: Min assist, Mod  assist Gait Distance (Feet): 3 Feet Assistive device: Rolling walker (2 wheels) Gait Pattern/deviations: Step-to pattern, Decreased weight shift to left, Staggering right General Gait Details: pt was able to march in place alternating 20 x prior to ambulating a few feet along EOB to recliner Gait velocity: decreased     Posture / Balance Dynamic Sitting Balance Sitting balance - Comments: no LOB while seated EOB with BLE feet support on floor Balance Overall balance assessment: Needs assistance Sitting-balance support: Feet supported, Single extremity supported Sitting balance-Leahy Scale: Good Sitting balance - Comments: no LOB while seated EOB with BLE feet support on floor Standing balance support: Bilateral upper extremity supported, During functional activity, Reliant on assistive device for balance Standing balance-Leahy Scale: Fair Standing balance comment: pt has a R lateral lean but with vcs is easily able to correct. does have one occasion of staggering but no intervention required.     Special needs/care consideration Diabetic management hgb A1C 5.7    Previous Home Environment (from acute therapy documentation) Living Arrangements: Children Available Help at Discharge: Family, Available 24 hours/day Type of Home: Mobile home Home Layout: One level Home Access: Stairs to enter Entrance Stairs-Rails: Can reach both Entrance Stairs-Number of Steps: 5, ramp Bathroom Shower/Tub: Health visitor: Handicapped height Bathroom Accessibility: Yes How Accessible: Accessible via walker Home Care Services: No   Discharge Living Setting Plans for Discharge Living Setting: Patient's home, Lives with (comment) (adult children (son and daughter)) Type of Home at Discharge: Mobile home Discharge Home Layout: One level Discharge Home Access: Ramped entrance Discharge Bathroom Shower/Tub: Walk-in shower Discharge Bathroom Toilet: Handicapped height Discharge Bathroom  Accessibility: Yes How Accessible: Accessible via wheelchair Does the patient have any problems obtaining your medications?: No   Social/Family/Support Systems Patient Roles: Parent, Caregiver Contact Information: adult son is dependent for ADLs/iADLs and she is his caregiver; her adult daughter Amiracle is currently providing care for him and able to provide assist for her at discharge if needed Anticipated Caregiver: daughter, Amiracle, and mom, Harriett Sine Anticipated Industrial/product designer Information: Harriett Sine: [phone] Ability/Limitations of Caregiver: none stated Caregiver Availability: 24/7 Discharge Plan Discussed with Primary Caregiver: Yes Is Caregiver In Agreement with Plan?: Yes Does Caregiver/Family have Issues with Lodging/Transportation  while Pt is in Rehab?: No   Goals Patient/Family Goal for Rehab: PT/OT supervision to mod I, SLP n/a Expected length of stay: 10-14 days Additional Information: Discharge plan: return to patient's home where she lives with her 2 adult children (1 is disabled and the other is providing care for him at this time).  She also has support from her mother. Pt/Family Agrees to Admission and willing to participate: Yes Program Orientation Provided & Reviewed with Pt/Caregiver Including Roles  & Responsibilities: Yes   Decrease burden of Care through IP rehab admission: n/a   Possible need for SNF placement upon discharge: Not anticipated.  Pt has excellent support from her mother and adult daughter at discharge.  Discharge plan: return to pt's home, daughter is primary 24/7 caregiver, and mother is secondary caregiver.    Patient Condition: I have reviewed medical records from [facility], spoken with CSW, and patient, daughter, and family member. I discussed via phone for inpatient rehabilitation assessment.  Patient will benefit from ongoing PT, OT, and SLP, can actively participate in 3 hours of therapy a day 5 days of the week, and can make measurable gains  during the admission.  Patient will also benefit from the coordinated team approach during an Inpatient Acute Rehabilitation admission.  The patient will receive intensive therapy as well as Rehabilitation physician, nursing, social worker, and care management interventions.  Due to safety, skin/wound care, disease management, medication administration, pain management, and patient education the patient requires 24 hour a day rehabilitation nursing.  The patient is currently min assist with mobility and basic ADLs.  Discharge setting and therapy post discharge at  home  is anticipated.  Patient has agreed to participate in the Acute Inpatient Rehabilitation Program and will admit today.   Preadmission Screen Completed By:  Trish Mage, PT, DPT 04/14/2023 11:39 AM ______________________________________________________________________   Discussed status with Dr. Riley Kill on 04/14/23 at 0915 and received approval for admission today.   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 Yes Co-Morbidities requiring supervision/potential complications: Dizziness/vertigo, nausea, headaches, right vertebral artery occlusion, hyperlipidemia Due to bowel management, safety, skin/wound care, disease management, medication administration, pain management, and patient education, does the patient require 24 hr/day rehab nursing? Yes Does the patient require coordinated care of a physician, rehab nurse, PT, OT to address physical and functional deficits in the context of the above medical diagnosis(es)? Yes Addressing deficits in the following areas: balance, endurance, locomotion, strength, transferring, bowel/bladder  control, bathing, dressing, feeding, grooming, and toileting Can the patient actively participate in an intensive therapy program of at least 3 hrs of therapy 5 days a week? Yes The potential for patient to make measurable gains while on inpatient rehab is good Anticipated functional outcomes upon discharge from inpatient rehab: supervision PT, supervision OT Estimated rehab length of stay to reach the above functional goals is: 10-14 days Anticipated discharge destination: Home 10. Overall Rehab/Functional Prognosis: good     MD Signature:   Angelina Sheriff, DO 04/14/2023            Revision History

## 2023-04-14 NOTE — TOC Transition Note (Signed)
Transition of Care [facility]) - Discharge Note   Patient Details  Name: JERMIKA OLDEN MRN: [id number] Date of Birth: Mar 09, 1966  Transition of Care [facility]) CM/SW Contact:  Allena Katz, LCSW Phone Number: 04/14/2023, 12:09 PM   Clinical Narrative:   Pt discharging to CIR. Medical neccesity printed to unit. CSW signing off.    Final next level of care: IP Rehab Facility Barriers to Discharge: Barriers Resolved   Patient Goals and CMS Choice Patient states their goals for this hospitalization and ongoing recovery are:: go to CIR CMS Medicare.gov Compare Post Acute Care list provided to:: Patient        Discharge Placement                Patient to be transferred to facility by: [facility]   Patient and family notified of of transfer: 04/14/23  Discharge Plan and Services Additional resources added to the After Visit Summary for       Post Acute Care Choice: IP Rehab                               Social Drivers of Health (SDOH) Interventions SDOH Screenings   Food Insecurity: No Food Insecurity (04/13/2023)  Housing: Unknown (04/13/2023)  Transportation Needs: No Transportation Needs (04/13/2023)  Utilities: Not At Risk (04/13/2023)  Tobacco Use: Low Risk  (04/12/2023)     Readmission Risk Interventions     No data to display

## 2023-04-14 NOTE — Progress Notes (Addendum)
Approximately 1211--Report called to Morrie Sheldon, RN at [facility]. All questions/ concerns answered at this time. This RN stated that pt will be transporting via [facility] and that they have been made aware. Telemetry disconnected and notified.   Approximately 1600-- Pt transported to CIR via medical transport. All PIVs removed. AVS discharge instructions placed in packet to provide to receiving facility.

## 2023-04-14 NOTE — Progress Notes (Signed)
[facility] IP rehab admissions - We have received authorization for acute inpatient rehab admission from insurance carrier.  Bed is available and I have clearance from attending MD to admit to CIR at [facility] today.  Nurse please call [phone] to give report.  Room assigned is [address] 07 and Dr. Shearon Stalls is receiving MD.  I have called [facility] to schedule transport.  Call me for questions.  [phone]

## 2023-04-14 NOTE — Progress Notes (Signed)
INPATIENT REHABILITATION ADMISSION NOTE   Arrival Method: EMS     Mental Orientation: oriented X4   Assessment: done   Skin: assessed   IV'S: none   Pain: none   Tubes and Drains: none   Safety Measures: reviewed with pt    Vital Signs: assessed   Height and Weight: done   Rehab Orientation: done   Family: not at bedside at this time    Notes: done  Marylu Lund, RN

## 2023-04-15 DIAGNOSIS — I63542 Cerebral infarction due to unspecified occlusion or stenosis of left cerebellar artery: Secondary | ICD-10-CM

## 2023-04-15 DIAGNOSIS — R278 Other lack of coordination: Secondary | ICD-10-CM | POA: Diagnosis not present

## 2023-04-15 DIAGNOSIS — H814 Vertigo of central origin: Secondary | ICD-10-CM

## 2023-04-15 NOTE — Plan of Care (Signed)
  Problem: Consults Goal: RH STROKE PATIENT EDUCATION Description: See Patient Education module for education specifics  Outcome: Progressing   Problem: RH BOWEL ELIMINATION Goal: RH STG MANAGE BOWEL W/MEDICATION W/ASSISTANCE Description: STG Manage Bowel with Medication with modI Assistance. Outcome: Progressing   Problem: RH PAIN MANAGEMENT Goal: RH STG PAIN MANAGED AT OR BELOW PT'S PAIN GOAL Description: < 4 with prns Outcome: Progressing

## 2023-04-15 NOTE — Evaluation (Signed)
Physical Therapy Assessment and Plan  Patient Details  Name: DONZELLA CARROL MRN: [id number] Date of Birth: 1965-08-02  PT Diagnosis: Abnormal posture, Abnormality of gait, Coordination disorder, Difficulty walking, Impaired sensation, and Pain in headache Rehab Potential: Excellent ELOS: 7-10 days   Today's Date: 04/15/2023 PT Individual Time: 1350-1505 PT Individual Time Calculation (min): 75 min    Hospital Problem: Principal Problem:   CVA (cerebral vascular accident) [facility])   Past Medical History: History reviewed. No pertinent past medical history. Past Surgical History:  Past Surgical History:  Procedure Laterality Date   SHOULDER SURGERY Left     Assessment & Plan Clinical Impression: Patient is a 58 y.o. year old female who arrive via EMS to [facility] ED on 04/12/2023 complaining of sudden onset of weakness, dizziness, right-sided headache and emesis x 1. She was hemodynamically stable and labs were unremarkable. She has no significant medical history. 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 Also showed abnormal right vertebral flow void and recommended CTA of head and neck. Admitted to hospitalist service and neurology consulted. 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 She was given Plavix 300 mg load and is now on daily aspirin 81 mg and Plavix 75 mg. 2D echo with EF of 60 to 65%. Hemoglobin A1c 5.7%. 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 No evidence of embolic source. She was started on atorvastatin 80 mg nightly. Required min assist for mobility. Right lateral lean noted. 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  Patient transferred  to CIR on 04/14/2023 .   Patient currently requires min with mobility secondary to   , decreased cardiorespiratoy endurance, impaired timing and sequencing, unbalanced muscle activation, and decreased coordination, decreased visual motor skills, decreased midline orientation, and decreased standing balance, decreased postural control, and decreased balance strategies.  Prior to hospitalization, patient was independent  with mobility and lived with Son, Daughter (25y.o. twins) in a Mobile home home.  Home access is 5, rampStairs to enter.  Patient will benefit from skilled PT intervention to maximize safe functional mobility, minimize fall risk, and decrease caregiver burden for planned discharge home with intermittent assist.  Anticipate patient will benefit from follow up OP at discharge.  PT - End of Session Activity Tolerance: Tolerates 30+ min activity with multiple rests Endurance Deficit: Yes PT Assessment Rehab Potential (ACUTE/IP ONLY): Excellent PT Barriers to Discharge: Decreased caregiver support;Lack of/limited family support PT Patient demonstrates impairments in the following area(s): Balance;Sensory;Safety;Endurance;Motor;Pain;Perception PT Transfers Functional Problem(s): Bed Mobility;Bed to Chair;Car;Furniture;Floor PT Locomotion Functional Problem(s): Ambulation;Stairs PT Plan PT Intensity: Minimum of 1-2 x/day ,45 to 90 minutes PT Frequency: 5 out of 7 days PT Duration Estimated Length of Stay: 7-10 days PT Treatment/Interventions: Ambulation/gait training;Community reintegration;DME/adaptive equipment instruction;Neuromuscular re-education;Psychosocial support;Stair training;UE/LE Strength taining/ROM;Balance/vestibular training;Discharge planning;Pain management;Skin care/wound management;Therapeutic Activities;UE/LE Coordination activities;Cognitive remediation/compensation;Disease management/prevention;Functional mobility training;Patient/family  education;Splinting/orthotics;Therapeutic Exercise;Visual/perceptual remediation/compensation PT Transfers Anticipated Outcome(s): mod-I using LRAD PT Locomotion Anticipated Outcome(s): mod-I using LRAD household level PT Recommendation Recommendations for Other Services: Therapeutic Recreation consult Therapeutic Recreation Interventions: Outing/community reintergration Follow Up Recommendations: Outpatient PT;24 hour supervision/assistance Patient destination: Home Equipment Recommended: To be determined   PT Evaluation Precautions/Restrictions Precautions Precautions: Fall;Other (comment) Precaution Comments: diplopia, dizziness, R lateral lean Restrictions Weight  Bearing Restrictions Per Provider Order: No Pain Pain Assessment Pain Scale: 0-10 Pain Score: 0-No pain Pain Interference Pain Interference Pain Effect on Sleep: 1. Rarely or not at all Pain Interference with Therapy Activities: 1. Rarely or not at all Pain Interference with Day-to-Day Activities: 1. 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 twins) Prior Function Level of Independence: Independent with basic ADLs;Independent with homemaking with ambulation;Independent with gait  Able to Take Stairs?: Yes Driving: Yes Vocation: Full time  employment Vocation Requirements: Patient is caregiver for son - he can transfer himself but needs assist to bathe/dress Vision/Perception  Vision - History Ability to See in Adequate Light: 0 Adequate Vision - Assessment Eye Alignment: Impaired (comment) Ocular Range of Motion: Within Functional Limits Alignment/Gaze Preference: Head turned;Head tilt (head tilt to L and turned to R) Tracking/Visual Pursuits: Requires cues, head turns, or add eye shifts to track;Other (comment) (R eye occaisonally shifting to catch up to the movement and L eye with nasal drfit when fatigued) Saccades: Undershoots;Additional eye shifts occurred during testing (noticed catch-up movement when looking to target on L side) Convergence: Impaired (comment) (L eye doesn't come in) Diplopia Assessment: Objects split on top of one another (but states earlier they were side by side; reports that it seems like now when she fixates her vision she starts to havd diplopia) Perception Perception: Impaired Preception Impairment Details: Spatial orientation Praxis Praxis: WFL  Cognition Overall Cognitive Status: Within Functional Limits for tasks assessed Arousal/Alertness: Awake/alert Orientation Level: Oriented X4 Year: 2025 Month: February Day of Week: Correct Attention: Focused;Sustained Focused Attention: Appears intact Sustained Attention: Appears intact Selective Attention: Appears intact Memory: Appears intact Awareness: Appears intact Problem Solving: Appears intact Safety/Judgment: Appears intact Sensation Sensation Light Touch: Impaired Detail Central sensation comments: reports L LE feels "numb" compared to R LE Light Touch Impaired Details: Impaired LLE Hot/Cold: Not tested Proprioception: Appears Intact Stereognosis: Not tested Coordination Gross Motor Movements are Fluid and Coordinated: No Fine Motor Movements are Fluid and Coordinated: No Coordination and Movement Description: impaired due  to R lateral lean bias with impaired midline orientation Finger Nose Finger Test: [facility] 9 Hole Peg Test: NT Motor  Motor Motor: Abnormal postural alignment and control Motor - Skilled Clinical Observations: Mild posterior and R lean bias in standing/ walking   Trunk/Postural Assessment  Cervical Assessment Cervical Assessment: Exceptions to [facility] (limits neck movements to avoid dizziness) Thoracic Assessment Thoracic Assessment: Within Functional Limits Lumbar Assessment Lumbar Assessment: Within Functional Limits Postural Control Postural Control: Deficits on evaluation Righting Reactions: delayed - mild rightward drift  Balance Balance Balance Assessed: Yes Standardized Balance Assessment Standardized Balance Assessment: Berg Balance Test Berg Balance Test Sit to Stand: Able to stand without using hands and stabilize independently Standing Unsupported: Able to stand 2 minutes with supervision Sitting with Back Unsupported but Feet Supported on Floor or Stool: Able to sit safely and securely 2 minutes Stand to Sit: Sits safely with minimal  use of hands Transfers: Able to transfer safely, minor use of hands Standing Unsupported with Eyes Closed: Able to stand 10 seconds with supervision Standing Ubsupported with Feet Together: Able to place feet together independently and stand for 1 minute with supervision (as close as possible given knees) From Standing, Reach Forward with Outstretched Arm: Reaches forward but needs supervision From Standing Position, Pick up Object from Floor: Able to pick up shoe, needs supervision From Standing Position, Turn to Look Behind Over each Shoulder: Needs supervision when turning Turn 360 Degrees: Needs close supervision or verbal cueing Standing Unsupported, Alternately Place Feet on Step/Stool: Able to complete 4 steps without aid or supervision Standing Unsupported, One Foot in Front: Able to take small step independently and hold 30  seconds Standing on One Leg: Tries to lift leg/unable to hold 3 seconds but remains standing independently Total Score: 36 Static Sitting Balance Static Sitting - Balance Support: No upper extremity supported;Feet supported Static Sitting - Level of Assistance: 7: Independent Dynamic Sitting Balance Dynamic Sitting - Balance Support: No upper extremity supported;Feet supported;During functional activity Dynamic Sitting - Level of Assistance: 5: Stand by assistance Static Standing Balance Static Standing - Balance Support: During functional activity;No upper extremity supported Static Standing - Level of Assistance: 5: Stand by assistance;Other (comment) (CGA) Dynamic Standing Balance Dynamic Standing - Balance Support: During functional activity;No upper extremity supported Dynamic Standing - Level of Assistance: 4: Min assist;Other (comment) (CGA) Extremity Assessment  RLE Assessment RLE Assessment: Exceptions to [facility] Active Range of Motion (AROM) Comments: WFL/WNL General Strength Comments: assessed sitting EOB RLE Strength Right Hip Flexion: 4+/5 Right Knee Flexion: 5/5 Right Knee Extension: 5/5 Right Ankle Dorsiflexion: 5/5 Right Ankle Plantar Flexion: 5/5 LLE Assessment LLE Assessment: Exceptions to [facility] Active Range of Motion (AROM) Comments: WFL/WNL General Strength Comments: assessed sitting EOB LLE Strength Left Hip Flexion: 4+/5 Left Knee Flexion: 5/5 Left Knee Extension: 5/5 Left Ankle Dorsiflexion: 5/5 Left Ankle Plantar Flexion: 5/5  Care Tool Care Tool Bed Mobility Roll left and right activity   Roll left and right assist level: Supervision/Verbal cueing    Sit to lying activity   Sit to lying assist level: Supervision/Verbal cueing Sit to lying assistive device comment: using hospital bed features (sleeps in her son's hospital bed @ home)  Lying to sitting on side of bed activity   Lying to sitting on side of bed assist level: the ability to move from lying  on the back to sitting on the side of the bed with no back support.: Supervision/Verbal cueing Lying to sitting on side of bed assist device comment: the ability to move from lying on the back to sitting on the side of the bed with no back support.: using hospital bed features (sleeps in her son's hospital bed @ home)   Care Tool Transfers Sit to stand transfer   Sit to stand assist level: Contact Guard/Touching assist    Chair/bed transfer   Chair/bed transfer assist level: Minimal Assistance - Patient > 75%    Car transfer   Car transfer assist level: Minimal Assistance - Patient > 75%      Care Tool Locomotion Ambulation   Assist level: Minimal Assistance - Patient > 75% Assistive device: No Device Max distance: 219ft  Walk 10 feet activity   Assist level: Minimal Assistance - Patient > 75% Assistive device: No Device   Walk 50 feet with 2 turns activity   Assist level: Minimal Assistance - Patient > 75% Assistive device: No Device  Walk 150 feet activity   Assist level: Minimal Assistance - Patient > 75% Assistive device: No Device  Walk 10 feet on uneven surfaces activity   Assist level: Minimal Assistance - Patient > 75%    Stairs   Assist level: Minimal Assistance - Patient > 75% Stairs assistive device: 2 hand rails Max number of stairs: 12  Walk up/down 1 step activity   Walk up/down 1 step (curb) assist level: Minimal Assistance - Patient > 75% Walk up/down 1 step or curb assistive device: 2 hand rails  Walk up/down 4 steps activity   Walk up/down 4 steps assist level: Minimal Assistance - Patient > 75% Walk up/down 4 steps assistive device: 2 hand rails  Walk up/down 12 steps activity   Walk up/down 12 steps assist level: Minimal Assistance - Patient > 75% Walk up/down 12 steps assistive device: 2 hand rails  Pick up small objects from floor   Pick up small object from the floor assist level: Minimal Assistance - Patient > 75%    Wheelchair Is the patient  using a wheelchair?: No          Wheel 50 feet with 2 turns activity      Wheel 150 feet activity        Refer to Care Plan for Long Term Goals  SHORT TERM GOAL WEEK 1 PT Short Term Goal 1 (Week 1): = to LTGs based on ELOS  Recommendations for other services: Therapeutic Recreation  Outing/community reintegration  Skilled Therapeutic Intervention Pt received supine in bed awake with nurse present and pt agreeable to therapy session. Evaluation completed (see details above) with patient education regarding purpose of PT evaluation, PT POC and goals, therapy schedule, weekly team meetings, and other CIR information including safety plan and fall risk safety. Pt performed the below functional mobility tasks with the specified levels of skilled cuing and assistance. Patient participated in [facility] and demonstrates increased fall risk as noted by score of  36/56.  (<36= high risk for falls, close to 100%; 37-45 significant >80%; 46-51 moderate >50%; 52-55 lower >25%). Throughout session, therapist cuing and encouraging relaxed upper body/head/trunk posturing as pt moves very slow and guarded with all mobility tasks avoiding head rotations and visual scanning to avoid increased dizziness and instability. Cuing for increasing gait speed, increased arm swing, and turning head when looking to navigate. At end of session, pt left supine in bed with needs in reach and bed alarm on.  Mobility Bed Mobility Bed Mobility: Supine to Sit;Sit to Supine (reports she sleeps in her son's hospital bed at home with [facility] elevated) Rolling Right: Supervision/verbal cueing Right Sidelying to Sit: Supervision/Verbal cueing Supine to Sit: Supervision/Verbal cueing Sit to Supine: Supervision/Verbal cueing Sit to Sidelying Right: Supervision/Verbal cueing Transfers Transfers: Sit to Stand;Stand to Sit;Stand Pivot Transfers Sit to Stand: Minimal Assistance - Patient > 75% Stand to Sit: Minimal Assistance -  Patient > 75% Stand Pivot Transfers: Minimal Assistance - Patient > 75% Stand Pivot Transfer Details: Verbal cues for technique;Verbal cues for sequencing;Tactile cues for sequencing;Tactile cues for weight shifting;Tactile cues for posture;Verbal cues for precautions/safety Transfer (Assistive device): None Locomotion  Gait Ambulation: Yes Gait Assistance: Minimal Assistance - Patient > 75% Gait Distance (Feet): 150 Feet Assistive device: None Gait Assistance Details: Verbal cues for technique;Verbal cues for sequencing;Verbal cues for gait pattern;Tactile cues for weight shifting;Tactile cues for sequencing;Tactile cues for posture;Visual cues/gestures for sequencing;Manual facilitation for weight shifting Gait Gait: Yes Gait Pattern: Impaired Gait Pattern:  (  R lateral lean bias) Gait velocity: significantly decreased Stairs / Additional Locomotion Stairs: Yes Stairs Assistance: Minimal Assistance - Patient > 75%;Contact Guard/Touching assist Stair Management Technique: Two rails;Alternating pattern;Forwards Number of Stairs: 12 Height of Stairs: 6 Ramp: Minimal Assistance - Patient >75% Curb: Minimal Assistance - Patient >75% Wheelchair Mobility Wheelchair Mobility: No   Discharge Criteria: Patient will be discharged from PT if patient refuses treatment 3 consecutive times without medical reason, if treatment goals not met, if there is a change in medical status, if patient makes no progress towards goals or if patient is discharged from hospital.  The above assessment, treatment plan, treatment alternatives and goals were discussed and mutually agreed upon: by patient  Ginny Forth , PT, DPT, NCS, CSRS 04/15/2023, 3:16 PM

## 2023-04-15 NOTE — Evaluation (Signed)
Occupational Therapy Assessment and Plan  Patient Details  Name: Brittney Yu MRN: [id number] Date of Birth: 07-Mar-1966  OT Diagnosis: abnormal posture, disturbance of vision, and muscle weakness (generalized) Rehab Potential: Rehab Potential (ACUTE ONLY): Excellent ELOS: 7-10 days   Today's Date: 04/15/2023 OT Individual Time: 0945-1100 OT Individual Time Calculation (min): 75 min     Hospital Problem: Principal Problem:   CVA (cerebral vascular accident) [facility])   Past Medical History: History reviewed. No pertinent past medical history. Past Surgical History:  Past Surgical History:  Procedure Laterality Date   SHOULDER SURGERY Left     Assessment & Plan Clinical Impression: Brittney Yu is a 58 year old female who arrive via EMS to [facility] ED on 04/12/2023 complaining of sudden onset of weakness, dizziness, right-sided headache and emesis x 1. She was hemodynamically stable and labs were unremarkable. She has no significant medical history. 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 Also showed abnormal right vertebral flow void and recommended CTA of head and neck. Admitted to hospitalist service and neurology consulted. 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 She was given Plavix 300 mg load and is now on daily aspirin 81 mg and Plavix 75 mg. 2D echo with EF of 60 to 65%. Hemoglobin A1c 5.7%. 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 No evidence of embolic source. She was started on atorvastatin 80 mg nightly. Required min assist for mobility. Right lateral lean noted. 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  Patient transferred to CIR on  04/14/2023 .    Patient currently requires min with basic self-care skills secondary to decreased coordination, decreased midline orientation, and central origin.  Prior to hospitalization, patient could complete BADL/IADL with independent .  Patient will benefit from skilled intervention to decrease level of assist with basic self-care skills, increase independence with basic self-care skills, and increase level of independence with iADL prior to discharge home with care partner.  Anticipate patient will require intermittent supervision and follow up home health and follow up outpatient.  OT - End of Session Activity Tolerance: Decreased this session Endurance Deficit: Yes OT Assessment Rehab Potential (ACUTE ONLY): Excellent OT Barriers to Discharge: Decreased caregiver support;Lack of/limited family support OT Patient demonstrates impairments in the following area(s): Balance;Endurance;Vision;Sensory OT Basic ADL's Functional Problem(s): Dressing;Toileting OT Advanced ADL's Functional Problem(s): Full Meal Preparation;Laundry;Light Housekeeping OT Transfers Functional Problem(s): Toilet;Tub/Shower OT Plan OT Intensity: Minimum of 1-2 x/day, 45 to 90 minutes OT Frequency: 5 out of 7 days OT Duration/Estimated Length of Stay: 7-10 days OT Treatment/Interventions: Balance/vestibular training;Community reintegration;Disease mangement/prevention;Neuromuscular re-education;Patient/family education;Self Care/advanced ADL retraining;Therapeutic Exercise;UE/LE Coordination activities;Visual/perceptual remediation/compensation;UE/LE Strength taining/ROM;Therapeutic Activities;Psychosocial support;Functional mobility training;DME/adaptive equipment instruction;Discharge planning OT Self Feeding Anticipated Outcome(s): independent OT Basic Self-Care Anticipated Outcome(s): Mod I OT Toileting Anticipated Outcome(s): Mod I OT Bathroom Transfers Anticipated Outcome(s): Supervision OT  Recommendation Recommendations for Other Services: Neuropsych consult Patient destination: Home Follow Up Recommendations: Outpatient OT;Home health OT Equipment Recommended: To be determined   OT Evaluation Precautions/Restrictions  Precautions Precautions: Fall Precaution Comments: diplopia, dizziness Restrictions Weight Bearing Restrictions Per Provider Order: No   Pain Pain Assessment Pain Scale: 0-10 Pain Score: 0-No pain Patients Stated Pain Goal: 0 Home Living/Prior Functioning Home Living Family/patient expects  to be discharged to:: Private residence Living Arrangements: Children Available Help at Discharge: Available 24 hours/day, Family Type of Home: Mobile home Home Access: Stairs to enter [employer] of Steps: 5, ramp Entrance Stairs-Rails: Can reach both Home Layout: One level Bathroom Shower/Tub: Health visitor: Handicapped height Bathroom Accessibility: Yes  Lives With: Son, Daughter IADL History Homemaking Responsibilities: Yes Meal Prep Responsibility: Careers adviser Responsibility: Primary Cleaning Responsibility: Primary Bill Paying/Finance Responsibility: Primary Shopping Responsibility: Primary Child Care Responsibility: Primary Current License: Yes Mode of Transportation: Car Occupation: Full time employment 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 Prior Function Level of Independence: Independent with basic ADLs, Independent with homemaking with ambulation  Able to Take Stairs?: Yes Driving: Yes Vocation: Full time employment Vocation Requirements: Patient is caregiver for son - he can transfer himself but needs assist to bathe/dress Vision Baseline Vision/History: 1 Wears glasses Ability to See in Adequate Light: 0 Adequate Patient Visual Report: Diplopia Vision Assessment?: Yes Eye Alignment: Impaired (comment) Ocular Range of Motion: Within Functional Limits Alignment/Gaze Preference: Within Defined  Limits Tracking/Visual Pursuits: Able to track stimulus in all quads without difficulty;Decreased smoothness of horizontal tracking Saccades: Other (comment) Convergence:  (deferred) Visual Fields: No apparent deficits Diplopia Assessment: Objects split side to side;Present all the time/all directions;Disappears with one eye closed Perception  Perception: Impaired Praxis Praxis: WFL Cognition Cognition Overall Cognitive Status: Within Functional Limits for tasks assessed Arousal/Alertness: Awake/alert Orientation Level: Person;Place;Situation Person: Oriented Place: Oriented Situation: Oriented Memory: Appears intact Attention: Selective Selective Attention: Appears intact Awareness: Appears intact Problem Solving: Appears intact Safety/Judgment: Appears intact Brief Interview for Mental Status (BIMS) Repetition of Three Words (First Attempt): 3 Temporal Orientation: Year: Correct Temporal Orientation: Month: Accurate within 5 days Temporal Orientation: Day: Correct Recall: "Sock": Yes, no cue required Recall: "Blue": Yes, no cue required Recall: "Bed": Yes, no cue required BIMS Summary Score: 15 Sensation Sensation Light Touch: Impaired by gross assessment Hot/Cold: Not tested Proprioception: Appears Intact Stereognosis: Not tested Coordination Gross Motor Movements are Fluid and Coordinated: No Fine Motor Movements are Fluid and Coordinated: No Finger Nose Finger Test: [facility] 9 Hole Peg Test: NT Motor  Motor Motor: Abnormal postural alignment and control Motor - Skilled Clinical Observations: Mild posterior bias in standing/ walking  Trunk/Postural Assessment  Cervical Assessment Cervical Assessment: Exceptions to [facility] (Limits head movement to reduce dizziness) Thoracic Assessment Thoracic Assessment: Within Functional Limits Lumbar Assessment Lumbar Assessment: Within Functional Limits Postural Control Postural Control: Deficits on evaluation Righting  Reactions: delayed - mild rightward drift  Balance Balance Balance Assessed: Yes Static Sitting Balance Static Sitting - Balance Support: No upper extremity supported;Feet supported Static Sitting - Level of Assistance: 7: Independent Dynamic Sitting Balance Dynamic Sitting - Balance Support: No upper extremity supported;Feet supported;During functional activity Dynamic Sitting - Level of Assistance: 5: Stand by assistance Static Standing Balance Static Standing - Balance Support: Right upper extremity supported;Left upper extremity supported;During functional activity Static Standing - Level of Assistance: 5: Stand by assistance Dynamic Standing Balance Dynamic Standing - Balance Support: Right upper extremity supported;Left upper extremity supported;During functional activity Dynamic Standing - Level of Assistance: 4: Min assist Extremity/Trunk Assessment RUE Assessment RUE Assessment: Within Functional Limits General Strength Comments: 4+/5 LUE Assessment LUE Assessment: Within Functional Limits General Strength Comments: 4+/5  Care Tool Care Tool Self Care Eating   Eating Assist Level: Independent    Oral Care    Oral Care Assist Level: Supervision/Verbal cueing    Bathing   Body parts bathed by patient: Right  arm;Left arm;Chest;Abdomen;Front perineal area;Buttocks;Right upper leg;Left upper leg;Right lower leg;Left lower leg;Face     Assist Level: Supervision/Verbal cueing    Upper Body Dressing(including orthotics)   What is the patient wearing?: Pull over shirt   Assist Level: Set up assist    Lower Body Dressing (excluding footwear)   What is the patient wearing?: Underwear/pull up;Pants Assist for lower body dressing: Supervision/Verbal cueing    Putting on/Taking off footwear   What is the patient wearing?: Socks Assist for footwear: Set up assist       Care Tool Toileting Toileting activity Toileting Activity did not occur (Clothing management and  hygiene only): N/A (no void or bm)       Care Tool Bed Mobility Roll left and right activity   Roll left and right assist level: Supervision/Verbal cueing    Sit to lying activity   Sit to lying assist level: Supervision/Verbal cueing    Lying to sitting on side of bed activity   Lying to sitting on side of bed assist level: the ability to move from lying on the back to sitting on the side of the bed with no back support.: Supervision/Verbal cueing     Care Tool Transfers Sit to stand transfer   Sit to stand assist level: Contact Guard/Touching assist    Chair/bed transfer   Chair/bed transfer assist level: Minimal Assistance - Patient > 75%     Toilet transfer Toilet transfer activity did not occur: N/A       Care Tool Cognition  Expression of Ideas and Wants Expression of Ideas and Wants: 4. Without difficulty (complex and basic) - expresses complex messages without difficulty and with speech that is clear and easy to understand  Understanding Verbal and Non-Verbal Content Understanding Verbal and Non-Verbal Content: 4. Understands (complex and basic) - clear comprehension without cues or repetitions   Memory/Recall Ability Memory/Recall Ability : Current season;That he or she is in a hospital/hospital unit   Refer to Care Plan for Long Term Goals    Recommendations for other services: Neuropsych   Skilled Therapeutic Intervention:   Patient awake and alert supine in bed upon arrival, very eager to shower.  Patient modulating speed of movement to prevent onset of dizziness.  Moves slowly and cautiously and limits head/eye movements.  Patient walked to shower with min assist without walker - showered seated, and dressed while seated in chair in bathroom.  Patient reports diplopia worse when looking left - side by side orientation, reduced when looking right.  Encouraged patient to pay attention to areas where diplopia is reduced.  Patient returned to bed at end of session.   Bed alarm engaged and call bell/ personal items in reach.   ADL ADL Eating: Independent Where Assessed-Eating: Edge of bed Grooming: Supervision/safety Where Assessed-Grooming: Standing at sink Upper Body Bathing: Supervision/safety Where Assessed-Upper Body Bathing: Shower Lower Body Bathing: Supervision/safety Where Assessed-Lower Body Bathing: Shower Upper Body Dressing: Setup Where Assessed-Upper Body Dressing: Chair Lower Body Dressing: Setup Where Assessed-Lower Body Dressing: Chair Toileting: Unable to assess Tub/Shower Transfer: Unable to assess Tub/Shower Transfer Method: Unable to assess Film/video editor: Minimal assistance Film/video editor Method: Designer, industrial/product: Event organiser  Bed Mobility Bed Mobility: Rolling Right;Right Sidelying to Sit;Sit to Sidelying Right Rolling Right: Supervision/verbal cueing Right Sidelying to Sit: Supervision/Verbal cueing Sit to Sidelying Right: Supervision/Verbal cueing Transfers Sit to Stand: Minimal Assistance - Patient > 75% Stand to Sit: Minimal Assistance - Patient > 75%  Discharge Criteria: Patient will be discharged from OT if patient refuses treatment 3 consecutive times without medical reason, if treatment goals not met, if there is a change in medical status, if patient makes no progress towards goals or if patient is discharged from hospital.  The above assessment, treatment plan, treatment alternatives and goals were discussed and mutually agreed upon: by patient  Collier Salina 04/15/2023, 1:05 PM

## 2023-04-15 NOTE — Plan of Care (Signed)
  Problem: RH Balance Goal: LTG Patient will maintain dynamic standing with ADLs (OT) Description: LTG:  Patient will maintain dynamic standing balance with assist during activities of daily living (OT)  Flowsheets (Taken 04/15/2023 1552) LTG: Pt will maintain dynamic standing balance during ADLs with: Independent   Problem: Sit to Stand Goal: LTG:  Patient will perform sit to stand in prep for activites of daily living with assistance level (OT) Description: LTG:  Patient will perform sit to stand in prep for activites of daily living with assistance level (OT) Flowsheets (Taken 04/15/2023 1552) LTG: PT will perform sit to stand in prep for activites of daily living with assistance level: Independent   Problem: RH Bathing Goal: LTG Patient will bathe all body parts with assist levels (OT) Description: LTG: Patient will bathe all body parts with assist levels (OT) Flowsheets (Taken 04/15/2023 1552) LTG: Pt will perform bathing with assistance level/cueing: Independent with assistive device    Problem: RH Dressing Goal: LTG Patient will perform upper body dressing (OT) Description: LTG Patient will perform upper body dressing with assist, with/without cues (OT). Flowsheets (Taken 04/15/2023 1552) LTG: Pt will perform upper body dressing with assistance level of: Independent Goal: LTG Patient will perform lower body dressing w/assist (OT) Description: LTG: Patient will perform lower body dressing with assist, with/without cues in positioning using equipment (OT) Flowsheets (Taken 04/15/2023 1552) LTG: Pt will perform lower body dressing with assistance level of: Independent   Problem: RH Toileting Goal: LTG Patient will perform toileting task (3/3 steps) with assistance level (OT) Description: LTG: Patient will perform toileting task (3/3 steps) with assistance level (OT)  Flowsheets (Taken 04/15/2023 1552) LTG: Pt will perform toileting task (3/3 steps) with assistance level: Independent with  assistive device   Problem: RH Vision Goal: RH LTG Vision Consulting civil engineer) Flowsheets (Taken 04/15/2023 1552) LTG: Vision Goals: Patient will identify most effective placement for tasks requiring visual attention/ acuity - e.g. reading   Problem: RH Toilet Transfers Goal: LTG Patient will perform toilet transfers w/assist (OT) Description: LTG: Patient will perform toilet transfers with assist, with/without cues using equipment (OT) Flowsheets (Taken 04/15/2023 1552) LTG: Pt will perform toilet transfers with assistance level of: Independent   Problem: RH Tub/Shower Transfers Goal: LTG Patient will perform tub/shower transfers w/assist (OT) Description: LTG: Patient will perform tub/shower transfers with assist, with/without cues using equipment (OT) Flowsheets (Taken 04/15/2023 1552) LTG: Pt will perform tub/shower stall transfers with assistance level of: Supervision/Verbal cueing

## 2023-04-15 NOTE — Progress Notes (Signed)
PROGRESS NOTE   Subjective/Complaints:    Objective:   ECHOCARDIOGRAM COMPLETE Result Date: 04/13/2023    ECHOCARDIOGRAM REPORT   Patient Name:   Brittney Yu Date of Exam: 04/13/2023 Medical Rec #:  [id number]       Height:       69.5 in Accession #:    [id number]      Weight:       231.5 lb Date of Birth:  01-Jun-1965       BSA:          2.209 m Patient Age:    58 years        BP:           113/40 mmHg Patient Gender: F               HR:           87 bpm. Exam Location:  [facility] Procedure: 2D Echo, Cardiac Doppler, Color Doppler and Saline Contrast Bubble            Study Indications:     Stroke  History:         Patient has no prior history of Echocardiogram examinations.                  Stroke.  Sonographer:     Mikki Harbor Referring Phys:  [id number] Inetta Fermo LAI Diagnosing Phys: Julien Nordmann MD  Sonographer Comments: Suboptimal subcostal window. IMPRESSIONS  1. Left ventricular ejection fraction, by estimation, is 60 to 65%. The left ventricle has normal function. The left ventricle has no regional wall motion abnormalities. Left ventricular diastolic parameters were normal.  2. Right ventricular systolic function is normal. The right ventricular size is normal. There is normal pulmonary artery systolic pressure. The estimated right ventricular systolic pressure is 33.4 mmHg.  3. Left atrial size was mildly dilated.  4. The mitral valve is normal in structure. No evidence of mitral valve regurgitation. No evidence of mitral stenosis.  5. The aortic valve is normal in structure. Aortic valve regurgitation is not visualized. No aortic stenosis is present.  6. The inferior vena cava is normal in size with greater than 50% respiratory variability, suggesting right atrial pressure of 3 mmHg.  7. Agitated saline contrast bubble study was negative, with no evidence of any interatrial shunt. FINDINGS  Left Ventricle: Left ventricular ejection fraction,  by estimation, is 60 to 65%. The left ventricle has normal function. The left ventricle has no regional wall motion abnormalities. The left ventricular internal cavity size was normal in size. There is  no left ventricular hypertrophy. Left ventricular diastolic parameters were normal. Right Ventricle: The right ventricular size is normal. No increase in right ventricular wall thickness. Right ventricular systolic function is normal. There is normal pulmonary artery systolic pressure. The tricuspid regurgitant velocity is 2.52 m/s, and  with an assumed right atrial pressure of 8 mmHg, the estimated right ventricular systolic pressure is 33.4 mmHg. Left Atrium: Left atrial size was mildly dilated. Right Atrium: Right atrial size was normal in size. Pericardium: There is no evidence of pericardial effusion. Mitral Valve: The mitral valve is normal in structure. No evidence of mitral  valve regurgitation. No evidence of mitral valve stenosis. MV peak gradient, 6.9 mmHg. The mean mitral valve gradient is 3.0 mmHg. Tricuspid Valve: The tricuspid valve is normal in structure. Tricuspid valve regurgitation is mild . No evidence of tricuspid stenosis. Aortic Valve: The aortic valve is normal in structure. Aortic valve regurgitation is not visualized. No aortic stenosis is present. Aortic valve mean gradient measures 8.0 mmHg. Aortic valve peak gradient measures 17.1 mmHg. Aortic valve area, by VTI measures 2.50 cm. Pulmonic Valve: The pulmonic valve was normal in structure. Pulmonic valve regurgitation is not visualized. No evidence of pulmonic stenosis. Aorta: The aortic root is normal in size and structure. Venous: The inferior vena cava is normal in size with greater than 50% respiratory variability, suggesting right atrial pressure of 3 mmHg. IAS/Shunts: No atrial level shunt detected by color flow Doppler. Agitated saline contrast was given intravenously to evaluate for intracardiac shunting. Agitated saline contrast  bubble study was negative, with no evidence of any interatrial shunt. There  is no evidence of a patent foramen ovale. There is no evidence of an atrial septal defect.  LEFT VENTRICLE PLAX 2D LVIDd:         4.00 cm     Diastology LVIDs:         2.10 cm     LV e' medial:    9.79 cm/s LV PW:         1.10 cm     LV E/e' medial:  11.7 LV IVS:        0.90 cm     LV e' lateral:   10.90 cm/s LVOT diam:     1.90 cm     LV E/e' lateral: 10.6 LV SV:         96 LV SV Index:   44 LVOT Area:     2.84 cm  LV Volumes (MOD) LV vol d, MOD A2C: 54.9 ml LV vol d, MOD A4C: 54.2 ml LV vol s, MOD A2C: 15.0 ml LV vol s, MOD A4C: 14.4 ml LV SV MOD A2C:     39.9 ml LV SV MOD A4C:     54.2 ml LV SV MOD BP:      40.5 ml RIGHT VENTRICLE RV Basal diam:  2.95 cm RV Mid diam:    2.30 cm RV S prime:     17.60 cm/s TAPSE (M-mode): 3.0 cm LEFT ATRIUM             Index        RIGHT ATRIUM           Index LA diam:        3.60 cm 1.63 cm/m   RA Area:     16.70 cm LA Vol (A2C):   81.5 ml 36.89 ml/m  RA Volume:   40.80 ml  18.47 ml/m LA Vol (A4C):   62.7 ml 28.38 ml/m LA Biplane Vol: 72.6 ml 32.86 ml/m  AORTIC VALVE                     PULMONIC VALVE AV Area (Vmax):    2.26 cm      PV Vmax:       1.24 m/s AV Area (Vmean):   2.27 cm      PV Peak grad:  6.2 mmHg AV Area (VTI):     2.50 cm AV Vmax:           207.00 cm/s AV Vmean:  126.000 cm/s AV VTI:            0.386 m AV Peak Grad:      17.1 mmHg AV Mean Grad:      8.0 mmHg LVOT Vmax:         165.00 cm/s LVOT Vmean:        101.000 cm/s LVOT VTI:          0.340 m LVOT/AV VTI ratio: 0.88  AORTA Ao Root diam: 2.70 cm MITRAL VALVE                TRICUSPID VALVE MV Area (PHT): 4.65 cm     TR Peak grad:   25.4 mmHg MV Area VTI:   2.74 cm     TR Vmax:        252.00 cm/s MV Peak grad:  6.9 mmHg MV Mean grad:  3.0 mmHg     SHUNTS MV Vmax:       1.31 m/s     Systemic VTI:  0.34 m MV Vmean:      74.2 cm/s    Systemic Diam: 1.90 cm MV Decel Time: 163 msec MV E velocity: 115.00 cm/s MV A velocity:  84.80 cm/s MV E/A ratio:  1.36 Julien Nordmann MD Electronically signed by Julien Nordmann MD Signature Date/Time: 04/13/2023/5:24:37 PM    Final    No results for input(s): "WBC", "HGB", "HCT", "PLT" in the last 72 hours. No results for input(s): "NA", "K", "CL", "CO2", "GLUCOSE", "BUN", "CREATININE", "CALCIUM" in the last 72 hours. No intake or output data in the 24 hours ending 04/15/23 0858      Physical Exam: Vital Signs Blood pressure (!) 128/54, pulse 74, temperature 98.7 F (37.1 C), resp. rate 17, height 5' 9.5" (1.765 m), weight 104.3 kg, last menstrual period 12/28/2016, SpO2 96%. General: No acute distress Mood and affect are appropriate Heart: Regular rate and rhythm no rubs murmurs or extra sounds Lungs: Clear to auscultation, breathing unlabored, no rales or wheezes Abdomen: Positive bowel sounds, soft nontender to palpation, nondistended Extremities: No clubbing, cyanosis, or edema Skin: No evidence of breakdown, no evidence of rash Neurologic: Cranial nerves II through XII intact, motor strength is 5/5 in bilateral deltoid, bicep, tricep, grip, hip flexor, knee extensors, ankle dorsiflexor and plantar flexor Sensory exam normal tingling LUE with LT otherwise intact, temp sensation reduce LUE vs RUE  Cerebellar exam normal finger to nose to finger as well as heel to shin in bilateral upper and lower extremities Poor standing balance Romberg not attempted  Musculoskeletal: Full range of motion in all 4 extremities. No joint swelling   Assessment/Plan: 1. Functional deficits which require 3+ hours per day of interdisciplinary therapy in a comprehensive inpatient rehab setting. Physiatrist is providing close team supervision and 24 hour management of active medical problems listed below. Physiatrist and rehab team continue to assess barriers to discharge/monitor patient progress toward functional and medical goals  Care Tool:  Bathing              Bathing assist        Upper Body Dressing/Undressing Upper body dressing        Upper body assist      Lower Body Dressing/Undressing Lower body dressing            Lower body assist       Toileting Toileting    Toileting assist       Transfers Chair/bed transfer  Transfers assist  Locomotion Ambulation   Ambulation assist              Walk 10 feet activity   Assist           Walk 50 feet activity   Assist           Walk 150 feet activity   Assist           Walk 10 feet on uneven surface  activity   Assist           Wheelchair     Assist               Wheelchair 50 feet with 2 turns activity    Assist            Wheelchair 150 feet activity     Assist          Blood pressure (!) 128/54, pulse 74, temperature 98.7 F (37.1 C), resp. rate 17, height 5' 9.5" (1.765 m), weight 104.3 kg, last menstrual period 12/28/2016, SpO2 96%.  Medical Problem List and Plan: 1. 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  Antithrombotics: -DVT/anticoagulation:  Pharmaceutical: Lovenox             -antiplatelet therapy: Aspirin and Plavix for three weeks followed by aspirin alone   3. Pain Management: Tylenol as needed   4. Mood/Behavior/Sleep: LCSW to evaluate and provide emotional support             -antipsychotic agents: n/a   5. Neuropsych/cognition: This patient is capable of making decisions on her own behalf.   6. Skin/Wound Care: Routine skin care checks   7. 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 Added PRN  meclizine 25 mg QID    LOS: 1 days A FACE TO FACE EVALUATION WAS PERFORMED  Erick Colace 04/15/2023, 8:58 AM

## 2023-04-15 NOTE — Plan of Care (Signed)
  Problem: RH Balance Goal: LTG Patient will maintain dynamic sitting balance (PT) Description: LTG:  Patient will maintain dynamic sitting balance with assistance during mobility activities (PT) Flowsheets (Taken 04/15/2023 1520) LTG: Pt will maintain dynamic sitting balance during mobility activities with:: Independent with assistive device  Goal: LTG Patient will maintain dynamic standing balance (PT) Description: LTG:  Patient will maintain dynamic standing balance with assistance during mobility activities (PT) Flowsheets (Taken 04/15/2023 1520) LTG: Pt will maintain dynamic standing balance during mobility activities with:: Independent with assistive device    Problem: Sit to Stand Goal: LTG:  Patient will perform sit to stand with assistance level (PT) Description: LTG:  Patient will perform sit to stand with assistance level (PT) Flowsheets (Taken 04/15/2023 1520) LTG: PT will perform sit to stand in preparation for functional mobility with assistance level: Independent with assistive device   Problem: RH Bed Mobility Goal: LTG Patient will perform bed mobility with assist (PT) Description: LTG: Patient will perform bed mobility with assistance, with/without cues (PT). Flowsheets (Taken 04/15/2023 1520) LTG: Pt will perform bed mobility with assistance level of: Independent with assistive device    Problem: RH Bed to Chair Transfers Goal: LTG Patient will perform bed/chair transfers w/assist (PT) Description: LTG: Patient will perform bed to chair transfers with assistance (PT). Flowsheets (Taken 04/15/2023 1520) LTG: Pt will perform Bed to Chair Transfers with assistance level: Independent with assistive device    Problem: RH Car Transfers Goal: LTG Patient will perform car transfers with assist (PT) Description: LTG: Patient will perform car transfers with assistance (PT). Flowsheets (Taken 04/15/2023 1520) LTG: Pt will perform car transfers with assist:: Independent with assistive  device    Problem: RH Ambulation Goal: LTG Patient will ambulate in controlled environment (PT) Description: LTG: Patient will ambulate in a controlled environment, # of feet with assistance (PT). Flowsheets (Taken 04/15/2023 1520) LTG: Pt will ambulate in controlled environ  assist needed:: Independent with assistive device LTG: Ambulation distance in controlled environment: >129ft using LRAD Goal: LTG Patient will ambulate in home environment (PT) Description: LTG: Patient will ambulate in home environment, # of feet with assistance (PT). Flowsheets (Taken 04/15/2023 1520) LTG: Pt will ambulate in home environ  assist needed:: Independent with assistive device LTG: Ambulation distance in home environment: 59ft using LRAD   Problem: RH Stairs Goal: LTG Patient will ambulate up and down stairs w/assist (PT) Description: LTG: Patient will ambulate up and down # of stairs with assistance (PT) Flowsheets (Taken 04/15/2023 1520) LTG: Pt will ambulate up/down stairs assist needed:: Supervision/Verbal cueing LTG: Pt will  ambulate up and down number of stairs: 4 steps using B HRs

## 2023-04-15 NOTE — Evaluation (Signed)
Speech Language Pathology Assessment and Plan  Patient Details  Name: Brittney Yu MRN: [id number] Date of Birth: [birth year]   Today's Date: 04/15/2023 SLP Individual Time: 0800-0900 SLP Individual Time Calculation (min): 60 min   Hospital Problem: Principal Problem:   CVA (cerebral vascular accident) [facility])  Past Medical History: History reviewed. No pertinent past medical history. Past Surgical History:  Past Surgical History:  Procedure Laterality Date   SHOULDER SURGERY Left     Assessment / Plan / Recommendation Clinical Impression HPI: Pt is a 58 y/o female with no significant PMH who presented to [facility] on [facility] with c/o dizziness and weakness. In ED, vitals reassuring and labs unremarkable. MRI brain showed small acute infarcts in the right lateral medulla and right cerebellum. 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 Recommendations from neurology were for aspirin and plavix for 3 weeks followed by aspirin alone.   Clinical Impression:  Patient was assessed via the CLQT (Cognitive Linguistic Quick Test) to evaluate cognitive linguistic functioning. Patient scored WFL on all subtests. Patient sustained attention to entire evaluation and demonstrate intact awareness, memory and problem solving skills. Patient was 100% intelligible throughout the evaluation and reports no swallowing issues. No further SLP needs at this time. Please reconsult if change in status.    Skilled Therapeutic Interventions          Patient evaluated using a standardized cognitive linguistic assessment and bedside swallow evaluation to assess current cognitive, communicative and swallowing function. See above for details.    SLP Assessment  Patient does not need any further Speech Lanaguage Pathology Services    Recommendations  Patient destination: Home Follow up Recommendations: None Equipment Recommended: None recommended by SLP     Pain Denies  SLP Evaluation Cognition Overall Cognitive  Status: Within Functional Limits for tasks assessed Arousal/Alertness: Awake/alert Orientation Level: Oriented X4 Year: 2025 Month: February Day of Week: Correct Attention: Selective Selective Attention: Appears intact Memory: Appears intact Awareness: Appears intact Problem Solving: Appears intact Safety/Judgment: Appears intact  Comprehension Auditory Comprehension Overall Auditory Comprehension: Appears within functional limits for tasks assessed Expression Expression Primary Mode of Expression: Verbal Verbal Expression Overall Verbal Expression: Appears within functional limits for tasks assessed Written Expression Dominant Hand: Right Oral Motor Oral Motor/Sensory Function Overall Oral Motor/Sensory Function: Within functional limits Motor Speech Overall Motor Speech: Appears within functional limits for tasks assessed  Care Tool Care Tool Cognition Ability to hear (with hearing aid or hearing appliances if normally used Ability to hear (with hearing aid or hearing appliances if normally used): 0. Adequate - no difficulty in normal conservation, social interaction, listening to TV   Expression of Ideas and Wants Expression of Ideas and Wants: 4. Without difficulty (complex and basic) - expresses complex messages without difficulty and with speech that is clear and easy to understand   Understanding Verbal and Non-Verbal Content Understanding Verbal and Non-Verbal Content: 4. Understands (complex and basic) - clear comprehension without cues or repetitions  Memory/Recall Ability Memory/Recall Ability : Current season;That he or she is in a hospital/hospital unit    Recommendations for other services: None   Discharge Criteria: Patient will be discharged from SLP if patient refuses treatment 3 consecutive times without medical reason, if treatment goals not met, if there is a change in medical status, if patient makes no progress towards goals or if patient is discharged  from hospital.  The above assessment, treatment plan, treatment alternatives and goals were discussed and mutually agreed upon: by patient  Sylis Ketchum M.A., CF-SLP  04/15/2023, 11:59 AM

## 2023-04-15 NOTE — Progress Notes (Signed)
Inpatient Rehabilitation Admission Medication Review by a Pharmacist  A complete drug regimen review was completed for this patient to identify any potential clinically significant medication issues.  High Risk Drug Classes Is patient taking? Indication by Medication  Antipsychotic No   Anticoagulant Yes Lovenox - DVT px  Antibiotic No   Opioid No   Antiplatelet Yes Asa/plavix 3 wks (2/19) then ASA - CVA  Hypoglycemics/insulin No   Vasoactive Medication No   Chemotherapy No   Other Yes Melatonin - sleep Meclizine - dizziness Atorvastatin - HLD     Type of Medication Issue Identified Description of Issue Recommendation(s)  Drug Interaction(s) (clinically significant)     Duplicate Therapy     Allergy     No Medication Administration End Date     Incorrect Dose     Additional Drug Therapy Needed     Significant med changes from prior encounter (inform family/care partners about these prior to discharge).    Other       Clinically significant medication issues were identified that warrant physician communication and completion of prescribed/recommended actions by midnight of the next day:  No  Name of provider notified for urgent issues identified:   Provider Method of Notification:     Pharmacist comments:   Time spent performing this drug regimen review (minutes):  30   Ulyses Southward, PharmD, [place], AAHIVP, CPP Infectious Disease Pharmacist 04/14/2023 1:40 PM

## 2023-04-16 DIAGNOSIS — I69398 Other sequelae of cerebral infarction: Secondary | ICD-10-CM | POA: Diagnosis not present

## 2023-04-16 DIAGNOSIS — I6389 Other cerebral infarction: Secondary | ICD-10-CM

## 2023-04-16 DIAGNOSIS — R209 Unspecified disturbances of skin sensation: Secondary | ICD-10-CM | POA: Diagnosis not present

## 2023-04-16 MED ORDER — ENSURE ENLIVE PO LIQD
237.0000 mL | Freq: Three times a day (TID) | ORAL | Status: DC
  Administered 2023-04-16 – 2023-04-23 (×9): 237 mL via ORAL

## 2023-04-16 NOTE — Plan of Care (Signed)
  Problem: Consults Goal: RH STROKE PATIENT EDUCATION Description: See Patient Education module for education specifics  Outcome: Progressing   Problem: RH BOWEL ELIMINATION Goal: RH STG MANAGE BOWEL WITH ASSISTANCE Description: STG Manage Bowel with mod I Assistance. Outcome: Progressing Goal: RH STG MANAGE BOWEL W/MEDICATION W/ASSISTANCE Description: STG Manage Bowel with Medication with modI Assistance. Outcome: Progressing   Problem: RH SAFETY Goal: RH STG ADHERE TO SAFETY PRECAUTIONS W/ASSISTANCE/DEVICE Description: STG Adhere to Safety Precautions With cues Assistance/Device. Outcome: Progressing   Problem: RH PAIN MANAGEMENT Goal: RH STG PAIN MANAGED AT OR BELOW PT'S PAIN GOAL Description: < 4 with prns Outcome: Progressing

## 2023-04-16 NOTE — Progress Notes (Signed)
PROGRESS NOTE   Subjective/Complaints:  No issues overnite , pt having some numbness on left side , could not feel lovenox shot left abdomin but could on RIght   Objective:   No results found.  No results for input(s): "WBC", "HGB", "HCT", "PLT" in the last 72 hours. No results for input(s): "NA", "K", "CL", "CO2", "GLUCOSE", "BUN", "CREATININE", "CALCIUM" in the last 72 hours.  Intake/Output Summary (Last 24 hours) at 04/16/2023 0933 Last data filed at 04/16/2023 0700 Gross per 24 hour  Intake 360 ml  Output --  Net 360 ml        Physical Exam: Vital Signs Blood pressure 130/69, pulse 63, temperature 98.5 F (36.9 C), resp. rate 17, height 5' 9.5" (1.765 m), weight 104.3 kg, last menstrual period 12/28/2016, SpO2 100%. General: No acute distress Mood and affect are appropriate Heart: Regular rate and rhythm no rubs murmurs or extra sounds Lungs: Clear to auscultation, breathing unlabored, no rales or wheezes Abdomen: Positive bowel sounds, soft nontender to palpation, nondistended Extremities: No clubbing, cyanosis, or edema Skin: No evidence of breakdown, no evidence of rash Neurologic: Cranial nerves II through XII intact, motor strength is 5/5 in bilateral deltoid, bicep, tricep, grip, hip flexor, knee extensors, ankle dorsiflexor and plantar flexor Sensory exam pinprick & temp sensation reduce LUE vs RUE  Cerebellar exam normal finger to nose to finger as well as heel to shin in bilateral upper and lower extremities Poor standing balance Romberg not attempted  Musculoskeletal: Full range of motion in all 4 extremities. No joint swelling   Assessment/Plan: 1. Functional deficits which require 3+ hours per day of interdisciplinary therapy in a comprehensive inpatient rehab setting. Physiatrist is providing close team supervision and 24 hour management of active medical problems listed below. Physiatrist and rehab  team continue to assess barriers to discharge/monitor patient progress toward functional and medical goals  Care Tool:  Bathing    Body parts bathed by patient: Right arm, Left arm, Chest, Abdomen, Front perineal area, Buttocks, Right upper leg, Left upper leg, Right lower leg, Left lower leg, Face         Bathing assist Assist Level: Supervision/Verbal cueing     Upper Body Dressing/Undressing Upper body dressing   What is the patient wearing?: Pull over shirt    Upper body assist Assist Level: Set up assist    Lower Body Dressing/Undressing Lower body dressing      What is the patient wearing?: Underwear/pull up, Pants     Lower body assist Assist for lower body dressing: Supervision/Verbal cueing     Toileting Toileting Toileting Activity did not occur (Clothing management and hygiene only): N/A (no void or bm)  Toileting assist       Transfers Chair/bed transfer  Transfers assist     Chair/bed transfer assist level: Minimal Assistance - Patient > 75%     Locomotion Ambulation   Ambulation assist      Assist level: Minimal Assistance - Patient > 75% Assistive device: No Device Max distance: 2107ft   Walk 10 feet activity   Assist     Assist level: Minimal Assistance - Patient > 75% Assistive device: No Device  Walk 50 feet activity   Assist    Assist level: Minimal Assistance - Patient > 75% Assistive device: No Device    Walk 150 feet activity   Assist    Assist level: Minimal Assistance - Patient > 75% Assistive device: No Device    Walk 10 feet on uneven surface  activity   Assist     Assist level: Minimal Assistance - Patient > 75%     Wheelchair     Assist Is the patient using a wheelchair?: No             Wheelchair 50 feet with 2 turns activity    Assist            Wheelchair 150 feet activity     Assist          Blood pressure 130/69, pulse 63, temperature 98.5 F (36.9 C),  resp. rate 17, height 5' 9.5" (1.765 m), weight 104.3 kg, last menstrual period 12/28/2016, SpO2 100%.  Medical Problem List and Plan: 1. 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  Antithrombotics: -DVT/anticoagulation:  Pharmaceutical: Lovenox             -antiplatelet therapy: Aspirin and Plavix for three weeks followed by aspirin alone   3. Pain Management: Tylenol as needed   4. Mood/Behavior/Sleep: LCSW to evaluate and provide emotional support             -antipsychotic agents: n/a   5. Neuropsych/cognition: This patient is capable of making decisions on her own behalf.   6. Skin/Wound Care: Routine skin care checks   7. Fluids/Electrolytes/Nutrition: Routine Is and Os and follow-up chemistries   9: Hyperlipidemia: continue statin   10: Prediabetes: carb conscience diet and monitor with PCP   11. Central vertigo. Added PRN  meclizine 25 mg QID    LOS: 2 days A FACE TO FACE EVALUATION WAS PERFORMED  Erick Colace 04/16/2023, 9:33 AM

## 2023-04-17 DIAGNOSIS — I63542 Cerebral infarction due to unspecified occlusion or stenosis of left cerebellar artery: Secondary | ICD-10-CM | POA: Diagnosis not present

## 2023-04-17 DIAGNOSIS — R7303 Prediabetes: Secondary | ICD-10-CM | POA: Diagnosis not present

## 2023-04-17 LAB — BASIC METABOLIC PANEL
Anion gap: 8 (ref 5–15)
BUN: 13 mg/dL (ref 6–20)
CO2: 26 mmol/L (ref 22–32)
Calcium: 9.1 mg/dL (ref 8.9–10.3)
Chloride: 106 mmol/L (ref 98–111)
Creatinine, Ser: 0.93 mg/dL (ref 0.44–1.00)
GFR, Estimated: >60 mL/min (ref >60)
Glucose, Bld: 97 mg/dL (ref 70–99)
Potassium: 4.3 mmol/L (ref 3.5–5.1)
Sodium: 140 mmol/L (ref 135–145)

## 2023-04-17 LAB — CBC
HCT: 42.8 % (ref 36.0–46.0)
Hemoglobin: 13.8 g/dL (ref 12.0–15.0)
MCH: 27.5 pg (ref 26.0–34.0)
MCHC: 32.2 g/dL (ref 30.0–36.0)
MCV: 85.4 fL (ref 80.0–100.0)
Platelets: 323 10*3/uL (ref 150–400)
RBC: 5.01 MIL/uL (ref 3.87–5.11)
RDW: 14.5 % (ref 11.5–15.5)
WBC: 6.1 10*3/uL (ref 4.0–10.5)
nRBC: 0 % (ref 0.0–0.2)

## 2023-04-17 MED ORDER — DOCUSATE SODIUM 100 MG PO CAPS
100.0000 mg | ORAL_CAPSULE | Freq: Two times a day (BID) | ORAL | Status: DC
  Administered 2023-04-17 – 2023-04-25 (×12): 100 mg via ORAL
  Filled 2023-04-17 (×16): qty 1

## 2023-04-17 NOTE — Progress Notes (Signed)
Inpatient Rehabilitation Care Coordinator Assessment and Plan Patient Details  Name: Brittney Yu MRN: [id number] Date of Birth: 03-05-1966  Today's Date: 04/17/2023  Hospital Problems: Principal Problem:   CVA (cerebral vascular accident) [facility])  Past Medical History: History reviewed. No pertinent past medical history. Past Surgical History:  Past Surgical History:  Procedure Laterality Date   SHOULDER SURGERY Left    Social History:  reports that she has never smoked. She has never used smokeless tobacco. She reports that she does not drink alcohol and does not use drugs.  Family / Support Systems Marital Status: Widow/Widower Patient Roles: Parent, Caregiver, Other (Comment) (grandmother) Children: Amiracle-daughter  son in home-disabled and she assists with care along with his twin sister Other Supports: Trinia-sister [phone]   nancy-mom Anticipated Caregiver: Daughter and mom Ability/Limitations of Caregiver: Daughter currently taking care of her twin brother and her 57 yo daughter who has special needs Caregiver Availability: 24/7 Family Dynamics: Close with children, granddaughter and sister and mom. All will pull together to assist pt and cmake sure her needs are met  Social History Preferred language: English Religion: None Cultural Background: No issues Education: HS Health Literacy - How often do you need to have someone help you when you read instructions, pamphlets, or other written material from your doctor or pharmacy?: Never Writes: Yes Employment Status: Unemployed Marine scientist Issues: No issues Guardian/Conservator: None-according to MD pt is capable of making her own decisions while here   Abuse/Neglect Abuse/Neglect Assessment Can Be Completed: Yes Physical Abuse: Denies Verbal Abuse: Denies Sexual Abuse: Denies Exploitation of patient/patient's resources: Denies Self-Neglect: Denies  Patient response to: Social Isolation - How  often do you feel lonely or isolated from those around you?: Never  Emotional Status Pt's affect, behavior and adjustment status: Pt is motivated to recover and regain her independence she is not one to sit still and is always on the move. She knows she needs to get better so she can assist her son. Recent Psychosocial Issues: other health issues and caregiver responsiblities Psychiatric History: No history may benefit from seeing neuro-psych while here Substance Abuse History: NA  Patient / Family Perceptions, Expectations & Goals Pt/Family understanding of illness & functional limitations: Pt is able to explain her stroke and deficits she is encourgage by her progress already and hopes she will be home soon. She feels understands her treatment plan moving forward. Premorbid pt/family roles/activities: mom, grandmother, daughter, widow, sibling, caregiver Anticipated changes in roles/activities/participation: resume Pt/family expectations/goals: Pt states: " I hope to be home soon but want to be doing for myself then also."  [employer]: Other (Comment) (services for son and granddaughter) Premorbid Home Care/DME Agencies: None Transportation available at discharge: self and now extended family Is the patient able to respond to transportation needs?: Yes In the past 12 months, has lack of transportation kept you from medical appointments or from getting medications?: No In the past 12 months, has lack of transportation kept you from meetings, work, or from getting things needed for daily living?: No Resource referrals recommended: Neuropsychology  Discharge Planning Living Arrangements: Children, Other relatives Support Systems: Children, Parent, Other relatives, Friends/neighbors Type of Residence: Private residence Insurance Resources: Media planner (specify) ([place] [employer] Medicaid) Surveyor, quantity Resources: Tree surgeon, Family Support Financial Screen  Referred: No Living Expenses: Rent Money Management: Patient Does the patient have any problems obtaining your medications?: No Home Management: self and daughter Patient/Family Preliminary Plans: Return home with daughter, son and granddaughter. Mom and sister  can assist if needed and are very supportive. Goals set for independent with device will work toward this Care Coordinator Barriers to Discharge: Insurance for SNF coverage Care Coordinator Anticipated Follow Up Needs: HH/OP  Clinical Impression Pleasant positive pat who is motivated to do well and return home to her family. Her daughter is currently caring for her brother and her own 57 yo daughter. Pt's mom can assist if needed. Will work on discharge needs.  Lucy Chris 04/17/2023, 8:52 AM

## 2023-04-17 NOTE — Progress Notes (Signed)
Patient ID: Brittney Yu, female   DOB: [birth year], 58 y.o.   MRN: [id number] Met with the patient to review current medical status, rehab schedule, team conference and plan of care. Discussed secondary risk management including HLD on Lipitor and prediabetes. Reviewed medications including DAPT x 3 weeks then ASA solo and HH/CMM dietary modification recommendations. Reviewed when to call EMS and follow up appointment; active Mychart account.  Continue to follow along to address educational needs to facilitate preparation for discharge. Pamelia Hoit

## 2023-04-17 NOTE — Progress Notes (Signed)
 Inpatient Rehabilitation  Patient information reviewed and entered into eRehab system by Cheri Rous, OTR/L, Rehab Quality Coordinator.   Information including medical coding, functional ability and quality indicators will be reviewed and updated through discharge.

## 2023-04-17 NOTE — IPOC Note (Signed)
Overall Plan of Care [facility]) Patient Details Name: Brittney Yu MRN: [id number] DOB: [birth year]  Admitting Diagnosis: CVA (cerebral vascular accident) [facility])  Hospital Problems: Principal Problem:   CVA (cerebral vascular accident) [facility])     Functional Problem List: Nursing Pain, Bowel, Safety, Medication Management, Endurance  PT Balance, Sensory, Safety, Endurance, Motor, Pain, Perception  OT Balance, Endurance, Vision, Sensory  SLP    TR         Basic ADL's: OT Dressing, Toileting     Advanced  ADL's: OT Full Meal Preparation, Laundry, Light Housekeeping     Transfers: PT Bed Mobility, Bed to Chair, Car, Furniture, Floor  OT Toilet, Tub/Shower     Locomotion: PT Ambulation, Stairs     Additional Impairments: OT    SLP        TR      Anticipated Outcomes Item Anticipated Outcome  Self Feeding independent  Swallowing      Basic self-care  Mod I  Toileting  Mod I   Bathroom Transfers Supervision  Bowel/Bladder  manage bowel w mod I assist  Transfers  mod-I using LRAD  Locomotion  mod-I using LRAD household level  Communication     Cognition     Pain  < 4 with prns  Safety/Judgment  manage safety w cues   Therapy Plan: PT Intensity: Minimum of 1-2 x/day ,45 to 90 minutes PT Frequency: 5 out of 7 days PT Duration Estimated Length of Stay: 7-10 days OT Intensity: Minimum of 1-2 x/day, 45 to 90 minutes OT Frequency: 5 out of 7 days OT Duration/Estimated Length of Stay: 7-10 days     Team Interventions: Nursing Interventions Patient/Family Education, Disease Management/Prevention, Discharge Planning, Pain Management, Bowel Management, Medication Management  PT interventions Ambulation/gait training, Community reintegration, DME/adaptive equipment instruction, Neuromuscular re-education, Psychosocial support, Stair training, UE/LE Strength taining/ROM, Warden/ranger, Discharge planning, Pain management, Skin care/wound management,  Therapeutic Activities, UE/LE Coordination activities, Cognitive remediation/compensation, Disease management/prevention, Functional mobility training, Patient/family education, Splinting/orthotics, Therapeutic Exercise, Visual/perceptual remediation/compensation  OT Interventions Warden/ranger, Community reintegration, Disease mangement/prevention, Neuromuscular re-education, Patient/family education, Self Care/advanced ADL retraining, Therapeutic Exercise, UE/LE Coordination activities, Visual/perceptual remediation/compensation, UE/LE Strength taining/ROM, Therapeutic Activities, Psychosocial support, Functional mobility training, DME/adaptive equipment instruction, Discharge planning  SLP Interventions    TR Interventions    SW/CM Interventions Discharge Planning, Psychosocial Support, Patient/Family Education   Barriers to Discharge MD  Medical stability  Nursing Home environment access/layout, Decreased caregiver support 1 level mobile home 5/ramp entry bil rails; adult son is dependent for ADLs/iADLs and she is his caregiver; her adult daughter Amiracle is currently providing care for him and able to provide assist for her at discharge if needed  PT Decreased caregiver support, Lack of/limited family support    OT Decreased caregiver support, Lack of/limited family support    SLP      SW Insurance for SNF coverage     Team Discharge Planning: Destination: PT-Home ,OT- Home , SLP-Home Projected Follow-up: PT-Outpatient PT, 24 hour supervision/assistance, OT-  Outpatient OT, Home health OT, SLP-None Projected Equipment Needs: PT-To be determined, OT- To be determined, SLP-None recommended by SLP Equipment Details: PT- , OT-  Patient/family involved in discharge planning: PT- Patient,  OT-Patient, SLP-Patient  MD ELOS: 7-10 Medical Rehab Prognosis:  Excellent Assessment: The patient has been admitted for CIR therapies with the diagnosis of R lateral medullary and  cerebellar CVA . The team will be addressing functional mobility, strength, stamina, balance, safety, adaptive techniques and equipment, self-care, bowel  and bladder mgt, patient and caregiver education. Goals have been set at mod I. Anticipated discharge destination is home.        See Team Conference Notes for weekly updates to the plan of care

## 2023-04-17 NOTE — Discharge Summary (Signed)
 Physician Discharge Summary  Patient ID: Brittney Yu MRN: [id number] DOB/AGE: 1965/06/29 58 y.o.  Admit date: 04/14/2023 Discharge date: 04/25/2023  Discharge Diagnoses:  Principal Problem:   CVA (cerebral vascular accident) [facility]) Active Problems:   Coping style affecting medical condition Active problems: Functional deficits secondary to right lateral medullary and cerebellar CVA Hyperlipidemia Prediabetes Central vertigo  Discharged Condition: good  Significant Diagnostic Studies: none  Labs:  Basic Metabolic Panel: Recent Labs  Lab 04/24/23 0511  NA 141  K 4.3  CL 98  CO2 27  GLUCOSE 92  BUN 18  CREATININE 1.03*  CALCIUM 10.3    CBC: Recent Labs  Lab 04/24/23 0511  WBC 4.1  HGB 13.8  HCT 43.1  MCV 85.9  PLT 309    Brief HPI:   Brittney Yu is a 58 y.o. female arrive via EMS to [facility] ED on 04/12/2023 complaining of sudden onset of weakness, dizziness, right-sided headache and emesis x 1. She was hemodynamically stable and labs were unremarkable. She has no significant medical history. 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 Also showed abnormal right vertebral flow void and recommended CTA of head and neck. Admitted to hospitalist service and neurology consulted. 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 She was given Plavix 300 mg load and is now on daily aspirin 81 mg and Plavix 75 mg. 2D echo with EF of 60 to 65%. Hemoglobin A1c 5.7%. 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 No evidence of embolic source. She was started on atorvastatin 80 mg nightly. Required min assist for mobility. Right lateral lean noted.    Hospital Course: Brittney Yu was admitted to rehab 04/14/2023 for inpatient therapies to consist of PT, ST and OT at least three hours five  days a week. Past admission physiatrist, therapy team and rehab RN have worked together to provide customized collaborative inpatient rehab. Dysarthria and word finding deficits noted at admission.  Central vertigo treated with as needed meclizine.  Follow-up labs on 2/03 within normal limits. SLP eval 1/31 for word-finding deficits and dysarthria. Continent of bowel and bladder. Avoided laxatives secondary to loose stool. Meclizine not needed and discontinued. Neuropsychology eval on 2/10.   Blood pressures were monitored on TID basis and remains stable  Rehab course: During patient's stay in rehab weekly team conferences were held to monitor patient's progress, set goals and discuss barriers to discharge. At admission, patient required an assist with mobility with basic self-care skills.  She  has had improvement in activity tolerance, balance, postural control as well as ability to compensate for deficits. She has had improvement in functional use RUE/LUE  and RLE/LLE as well as improvement in awareness  Patient has met 9 of 9 long term goals due to improved activity tolerance, improved balance, increased strength, functional use of  right upper extremity and right lower extremity, improved attention, improved awareness, and improved coordination.  Patient to discharge at an ambulatory level Modified Independent.   Patient's care partner is independent to provide the necessary physical assistance at discharge.    Outpatient PT and OT arranged through [facility] outpt rehab  Discharge disposition: 01-Home or Self Care     Diet: Heart healthy  Special Instructions: No driving, alcohol consumption or tobacco use.  Aspirin and Plavix for three weeks followed by aspirin alone.  Stop date 2/19.  Discharge Instructions     Ambulatory referral to Neurology   Complete by: As directed    An appointment is requested in approximately: 4 weeks   Ambulatory referral to Physical Medicine Rehab   Complete  by: As directed    Hospital follow-up   Discharge patient   Complete by: As directed    Discharge disposition: 01-Home or Self Care   Discharge patient date: 04/25/2023      Allergies as of 04/25/2023   No Known Allergies      Medication List     STOP taking these medications    meclizine 25 MG tablet Commonly known as: ANTIVERT       TAKE these medications    acetaminophen 325 MG tablet Commonly known as: TYLENOL Take 1-2 tablets (325-650 mg total) by mouth every 4 (four) hours as needed for mild pain (pain score 1-3).   aspirin EC 81 MG tablet Take 1 tablet (81 mg total) by mouth daily. Swallow whole.   atorvastatin 80 MG tablet Commonly known as: LIPITOR Take 1 tablet (80 mg total) by mouth daily.   clopidogrel 75 MG tablet Commonly known as: PLAVIX Take 1 tablet (75 mg total) by mouth daily for 8 days. Start taking on: April 26, 2023        Follow-up Information     Hillery Aldo, MD Follow up.   Specialty: Family Medicine Why: Call the office in 1 to 2 days to make arrangements for hospital follow-up appointment Contact information: [address] [place] [address] [phone]         Erick Colace, MD Follow up.   Specialty: Physical Medicine and Rehabilitation Why: office will call you to arrange your appt (sent) Contact information: [address] [place] [address] [phone]         [facility] Follow up.   Why: Call the office in 1 to 2 days to make arrangements for hospital follow-up appointment Contact information: [address]     [address] [place] [address] [phone]                Signed: Milinda Antis, PA-C 04/25/2023, 8:51 AM

## 2023-04-17 NOTE — Progress Notes (Signed)
Physical Therapy Session Note  Patient Details  Name: Brittney Yu MRN: [id number] Date of Birth: 05/03/1965  Today's Date: 04/17/2023 PT Individual Time: 0902-1000 PT Individual Time Calculation (min): 58 min   Short Term Goals: Week 1:  PT Short Term Goal 1 (Week 1): = to LTGs based on ELOS  Skilled Therapeutic Interventions/Progress Updates:  Patient seated upright in recliner on entrance to room. Patient alert and agreeable to PT session.   Patient with no pain complaint at start of session.  Does relate home life and circumstances of coming to hospital/ IPR.  Therapeutic Activity: Pt is able to don shoes with setup and tie laces while seated at recliner.  Transfers: Pt performed sit<>stand and stand pivot transfers throughout session with supervision. No cueing required for technique.   Gait Training:  Pt ambulated >200 ft using no AD with supervision/ CGA. Demonstrated one instance of increased sway over RLE but able to self correct balance with hip strategy. Guided in change in speed and head tilt downward with pt relating minimal increase in pain in R eye that resolves with repositioning and time.  Neuromuscular Re-ed: NMR facilitated during session with focus on dynamic standing balance and gait. Pt guided in: Standing toe touches to 6" step Progressed to step ups with no AD and reciprocating LE  Then pt guided in ambulation through agility ladder: One step into each square out and back x2 One step in each square with high knees and slower pace out and back x2 Alternating toe tap with progression of step into next square out and back x4 Alternating step with progression into next square after controlled circling of controlled swing of LE around cone x4.   progressed to circling LE around cones with hold of 2# weighted ball in front of herself. Requires time to process new motor plan but with min practice is able to perform with no LOB.   NMR performed for improvements  in motor control and coordination, balance, sequencing, judgement, and self confidence/ efficacy in performing all aspects of mobility at highest level of independence.   Patient seated upright in recliner at end of session with brakes locked, belt alarm set, and all needs within reach.  Therapy Documentation Precautions:  Precautions Precautions: Fall, Other (comment) Precaution Comments: diplopia, dizziness, R lateral lean Restrictions Weight Bearing Restrictions Per Provider Order: No  Pain:  Minimal increase in pain to R eye with downward tilt of head that resolves with time after repositioning.   Therapy/Group: Individual Therapy  Loel Dubonnet PT, DPT, CSRS 04/17/2023, 12:44 PM

## 2023-04-17 NOTE — Progress Notes (Signed)
Occupational Therapy Session Note  Patient Details  Name: Brittney Yu MRN: [id number] Date of Birth: 07/27/1965  Today's Date: 04/17/2023 OT Individual Time: 1056-1200 OT Individual Time Calculation (min): 64 min    Short Term Goals: Week 1:  OT Short Term Goal 1 (Week 1): STG=LTG due to LOS  Skilled Therapeutic Interventions/Progress Updates:  Pt greeted seated in w/c, pt agreeable to OT intervention.      Transfers/bed mobility/functional mobility: pt completed ambulatory ADL transfers with no AD and CGA.   Therapeutic activity: pt completed various visual tracking tasks with an emphasis on gaze stabilization and decreasing dizziness with mobility. Pt completed below activities at [facility] to challenge visual pursuits, scanning and oculomotor range of motion:  -visual scanning with 100% accuracy in 1 sec reaction time -visual pursuits- 100% accuracy in 1.28 sec reaction time -duplicating geoboard structure with an emphasis on scanning R<>L to recreate geoboard structure- no dizziness elicted during task.   Pt completed visual scanning task during functional ambulation with pt walking down hallway to collect discs on R and L side of hallway with pt completing task with CGA, mild dizziness reported when looking R and L side.   Pt completed functional reaching task pt having to match cones to discs on floor with an emphasis on looking up/down and simulated IADL task. Pt completed task with CGA, mild dizziness reported during task.    ADLs:  Grooming: pt completed standing oral care at sink with supervision.  UB dressing:pt needed MIN A to don bra but able to don OH shirt with set- up assist.  LB dressing: pt donned underwear from toilet with CGA for standing balance Footwear: pt donned socks/shoes with set- up assist from EOB  Bathing: pt completed bathing on TTB with supervision  Transfers: ambulatory transfer to toilet no AD and CGA Toileting: pt completed 3/3 toileting tasks with  supervision   Ended session with pt seated EOB with all needs within reach.                     Therapy Documentation Precautions:  Precautions Precautions: Fall, Other (comment) Precaution Comments: diplopia, dizziness, R lateral lean Restrictions Weight Bearing Restrictions Per Provider Order: No General: General OT Amount of Missed Time: 11 Minutes; delay in care  Pain: No pain   Therapy/Group: Individual Therapy  Barron Schmid 04/17/2023, 12:18 PM

## 2023-04-17 NOTE — Progress Notes (Signed)
[facility] Individual Statement of Services  Patient Name:  Brittney Yu  Date:  04/17/2023  Welcome to the [facility]patient [facility].  Our goal is to provide you with an individualized program based on your diagnosis and situation, designed to meet your specific needs.  With this comprehensive rehabilitation program, you will be expected to participate in at least 3 hours of rehabilitation therapies Monday-Friday, with modified therapy programming on the weekends.  Your rehabilitation program will include the following services:  Physical Therapy (PT), Occupational Therapy (OT), Speech Therapy (ST), 24 hour per day rehabilitation nursing, Therapeutic Recreaction (TR), Neuropsychology, Care Coordinator, Rehabilitation Medicine, Nutrition Services, and Pharmacy Services  Weekly team conferences will be held on Wednesday to discuss your progress.  Your Inpatient Rehabilitation Care Coordinator will talk with you frequently to get your input and to update you on team discussions.  Team conferences with you and your family in attendance may also be held.  Expected length of stay: 7-10 days  Overall anticipated outcome: Independent with device  Depending on your progress and recovery, your program may change. Your Inpatient Rehabilitation Care Coordinator will coordinate services and will keep you informed of any changes. Your Inpatient Rehabilitation Care Coordinator's name and contact numbers are listed  below.  The following services may also be recommended but are not provided by the Inpatient [facility]:  Driving Evaluations Home Health Rehabiltiation Services Outpatient Rehabilitation Services    Arrangements will be made to provide these services after discharge if needed.  Arrangements include referral to agencies that provide these services.  Your insurance has been verified to be:  [employer] Your primary doctor is:  Hillery Aldo  Pertinent information will be shared with your doctor and your insurance company.  Inpatient Rehabilitation Care Coordinator:  Dossie Der, [place] [phone] or Luna Glasgow  Information discussed with and copy given to patient by: Lucy Chris, 04/17/2023, 8:53 AM

## 2023-04-17 NOTE — Progress Notes (Incomplete)
Physical Therapy Session Note  Patient Details  Name: Brittney Yu MRN: [id number] Date of Birth: 1965/03/22  Today's Date: 04/17/2023 PT Individual Time: 4098-1191 PT Individual Time Calculation (min): 59 min   Short Term Goals: Week 1:  PT Short Term Goal 1 (Week 1): = to LTGs based on ELOS  Skilled Therapeutic Interventions/Progress Updates:  Patient *** on entrance to room. Patient alert and agreeable to PT session.   Patient with no pain complaint at start of session.  Therapeutic Activity: Bed Mobility: Pt performed supine <> sit with ***. VC/ tc required for ***. Transfers: Pt performed sit<>stand and stand pivot transfers throughout session with ***. Provided vc/ tc for***.  Gait Training:  Pt ambulated *** ft using *** with ***. Demonstrated ***. Provided vc/ tc for ***.  Wheelchair Mobility:  Pt propelled wheelchair *** feet with ***. Provided vc/ tc for ***.  Neuromuscular Re-ed: NMR facilitated during session with focus on***. Pt guided in ***. NMR performed for improvements in motor control and coordination, balance, sequencing, judgement, and self confidence/ efficacy in performing all aspects of mobility at highest level of independence.   Therapeutic Exercise: Pt performed the following exercises with vc/ tc for proper technique. ***  Patient *** at end of session with brakes locked, *** alarm set, and all needs within reach.  - hurdles out/ slalom back - dots/ twister on floor - FGA - amb with tidal tank with FGA tasks - Amb back to room  Therapy Documentation Precautions:  Precautions Precautions: Fall, Other (comment) Precaution Comments: diplopia, dizziness, R lateral lean Restrictions Weight Bearing Restrictions Per Provider Order: No  Pain:     Therapy/Group: Individual Therapy  Loel Dubonnet PT, DPT, CSRS 04/17/2023, 6:12 PM

## 2023-04-17 NOTE — Progress Notes (Deleted)
PROGRESS NOTE   Subjective/Complaints:  No events overnight.  No acute complaints.   Doing well overall, nausea and dizziness much improved but still somewhat persistent. States she did have 2 bowel movements overnight, is feeling better.  A.m. labs within normal limits and stable Vitals with some very mild diastolic hypotension, otherwise within normal limits. P.o. intakes 10 to 35% yesterday; ate 100% of breakfast this a.m. Continent bowel bladder, last bowel movement 2-2, medium  Objective:   No results found.  Recent Labs    04/17/23 0519  WBC 6.1  HGB 13.8  HCT 42.8  PLT 323   Recent Labs    04/17/23 0519  NA 140  K 4.3  CL 106  CO2 26  GLUCOSE 97  BUN 13  CREATININE 0.93  CALCIUM 9.1    Intake/Output Summary (Last 24 hours) at 04/17/2023 0751 Last data filed at 04/17/2023 0726 Gross per 24 hour  Intake 980 ml  Output --  Net 980 ml        Physical Exam: Vital Signs Blood pressure (!) 119/50, pulse 70, temperature 98.4 F (36.9 C), resp. rate 18, height 5' 9.5" (1.765 m), weight 104.3 kg, last menstrual period 12/28/2016, SpO2 98%. General: No acute distress.  Sitting upright at bedside. Mood and affect are appropriate Heart: Regular rate and rhythm no rubs murmurs or extra sounds Lungs: Clear to auscultation, breathing unlabored, no rales or wheezes Abdomen: Positive bowel sounds, soft nontender to palpation, nondistended Extremities: No clubbing, cyanosis, or edema Skin: No evidence of breakdown, no evidence of rash  Neurologic: Awake, alert, oriented x 3. Cranial nerves II through XII intact,  motor strength is 5/5 throughout  sensory exam pinprick & temp sensation reduce LUE vs RUE  Minimal nystagmus with extraocular motor testing. No apparent ataxia  Musculoskeletal: Full range of motion in all 4 extremities. No joint swelling    Assessment/Plan: 1. Functional deficits which require  3+ hours per day of interdisciplinary therapy in a comprehensive inpatient rehab setting. Physiatrist is providing close team supervision and 24 hour management of active medical problems listed below. Physiatrist and rehab team continue to assess barriers to discharge/monitor patient progress toward functional and medical goals  Care Tool:  Bathing    Body parts bathed by patient: Right arm, Left arm, Chest, Abdomen, Front perineal area, Buttocks, Right upper leg, Left upper leg, Right lower leg, Left lower leg, Face         Bathing assist Assist Level: Supervision/Verbal cueing     Upper Body Dressing/Undressing Upper body dressing   What is the patient wearing?: Pull over shirt    Upper body assist Assist Level: Set up assist    Lower Body Dressing/Undressing Lower body dressing      What is the patient wearing?: Underwear/pull up, Pants     Lower body assist Assist for lower body dressing: Supervision/Verbal cueing     Toileting Toileting Toileting Activity did not occur (Clothing management and hygiene only): N/A (no void or bm)  Toileting assist       Transfers Chair/bed transfer  Transfers assist     Chair/bed transfer assist level: Minimal Assistance - Patient > 75%  Locomotion Ambulation   Ambulation assist      Assist level: Minimal Assistance - Patient > 75% Assistive device: No Device Max distance: 283ft   Walk 10 feet activity   Assist     Assist level: Minimal Assistance - Patient > 75% Assistive device: No Device   Walk 50 feet activity   Assist    Assist level: Minimal Assistance - Patient > 75% Assistive device: No Device    Walk 150 feet activity   Assist    Assist level: Minimal Assistance - Patient > 75% Assistive device: No Device    Walk 10 feet on uneven surface  activity   Assist     Assist level: Minimal Assistance - Patient > 75%     Wheelchair     Assist Is the patient using a  wheelchair?: No             Wheelchair 50 feet with 2 turns activity    Assist            Wheelchair 150 feet activity     Assist          Blood pressure (!) 119/50, pulse 70, temperature 98.4 F (36.9 C), resp. rate 18, height 5' 9.5" (1.765 m), weight 104.3 kg, last menstrual period 12/28/2016, SpO2 98%.  Medical Problem List and Plan: 1. 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  Antithrombotics: -DVT/anticoagulation:  Pharmaceutical: Lovenox             -antiplatelet therapy: Aspirin and Plavix for three weeks followed by aspirin alone   3. Pain Management: Tylenol as needed   4. Mood/Behavior/Sleep: LCSW to evaluate and provide emotional support             -antipsychotic agents: n/a   5. Neuropsych/cognition: This patient is capable of making decisions on her own behalf.   2-3: Speech evaluation within functional limits for expression and comprehension  6. Skin/Wound Care: Routine skin care checks   7. Fluids/Electrolytes/Nutrition: Routine Is and Os and follow-up chemistries   2-3: Labs stable.  Some intermittent poor p.o. intakes, but did better today.  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 Added PRN  meclizine 25 mg QID-improving   -2/3: Not using as needed meclizine.  12.  Constipation.  -Used as needed Dulcolax 5 mg tablets twice over the weekend  -Last bowel movement 2-2, medium.  2-3: Will schedule Colace 100 mg twice daily LOS: 3 days A FACE TO FACE EVALUATION WAS PERFORMED  Angelina Sheriff 04/17/2023, 7:51 AM

## 2023-04-17 NOTE — Progress Notes (Addendum)
PROGRESS NOTE   Subjective/Complaints:  No new complaints or concerns elicited this morning.  No acute events overnight.  LBM 2/3  ROS: Patient denies fever, new vision changes, dizziness, nausea, vomiting, diarrhea,  shortness of breath or chest pain, headache, or mood change.   Objective:   No results found.  Recent Labs    04/17/23 0519  WBC 6.1  HGB 13.8  HCT 42.8  PLT 323   Recent Labs    04/17/23 0519  NA 140  K 4.3  CL 106  CO2 26  GLUCOSE 97  BUN 13  CREATININE 0.93  CALCIUM 9.1    Intake/Output Summary (Last 24 hours) at 04/17/2023 1631 Last data filed at 04/17/2023 1300 Gross per 24 hour  Intake 960 ml  Output --  Net 960 ml        Physical Exam: Vital Signs Blood pressure (!) 130/57, pulse 74, temperature 98.2 F (36.8 C), resp. rate 18, height 5' 9.5" (1.765 m), weight 104.3 kg, last menstrual period 12/28/2016, SpO2 100%. General: No acute distress, sitting in bed Mood and affect are appropriate Heart: Regular rate and rhythm no rubs murmurs or extra sounds Lungs: Clear to auscultation, breathing unlabored, no rales or wheezes, on room air Abdomen: Positive bowel sounds, soft nontender to palpation, nondistended Extremities: No clubbing, cyanosis, or edema Skin: No evidence of breakdown, no evidence of rash Neurologic: Cranial nerves II through XII grossly intact, motor strength is 5/5 in bilateral deltoid, bicep, tricep, grip, hip flexor, knee extensors, ankle dorsiflexor and plantar flexor Sensory exam pinprick & temp sensation reduce LUE vs RUE  Musculoskeletal: Full range of motion in all 4 extremities. No joint swelling   Assessment/Plan: 1. Functional deficits which require 3+ hours per day of interdisciplinary therapy in a comprehensive inpatient rehab setting. Physiatrist is providing close team supervision and 24 hour management of active medical problems listed  below. Physiatrist and rehab team continue to assess barriers to discharge/monitor patient progress toward functional and medical goals  Care Tool:  Bathing    Body parts bathed by patient: Right arm, Left arm, Chest, Abdomen, Front perineal area, Buttocks, Right upper leg, Left upper leg, Right lower leg, Left lower leg, Face         Bathing assist Assist Level: Supervision/Verbal cueing     Upper Body Dressing/Undressing Upper body dressing   What is the patient wearing?: Pull over shirt    Upper body assist Assist Level: Set up assist    Lower Body Dressing/Undressing Lower body dressing      What is the patient wearing?: Underwear/pull up, Pants     Lower body assist Assist for lower body dressing: Supervision/Verbal cueing     Toileting Toileting Toileting Activity did not occur (Clothing management and hygiene only): N/A (no void or bm)  Toileting assist Assist for toileting: Minimal Assistance - Patient > 75%     Transfers Chair/bed transfer  Transfers assist     Chair/bed transfer assist level: Minimal Assistance - Patient > 75%     Locomotion Ambulation   Ambulation assist      Assist level: Minimal Assistance - Patient > 75% Assistive device: No Device Max distance:  263ft   Walk 10 feet activity   Assist     Assist level: Minimal Assistance - Patient > 75% Assistive device: No Device   Walk 50 feet activity   Assist    Assist level: Minimal Assistance - Patient > 75% Assistive device: No Device    Walk 150 feet activity   Assist    Assist level: Minimal Assistance - Patient > 75% Assistive device: No Device    Walk 10 feet on uneven surface  activity   Assist     Assist level: Minimal Assistance - Patient > 75%     Wheelchair     Assist Is the patient using a wheelchair?: No             Wheelchair 50 feet with 2 turns activity    Assist            Wheelchair 150 feet activity      Assist          Blood pressure (!) 130/57, pulse 74, temperature 98.2 F (36.8 C), resp. rate 18, height 5' 9.5" (1.765 m), weight 104.3 kg, last menstrual period 12/28/2016, SpO2 100%.  Medical Problem List and Plan: 1. 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  Antithrombotics: -DVT/anticoagulation:  Pharmaceutical: Lovenox             -antiplatelet therapy: Aspirin and Plavix for three weeks followed by aspirin alone   3. Pain Management: Tylenol as needed   4. Mood/Behavior/Sleep: LCSW to evaluate and provide emotional support             -antipsychotic agents: n/a   5. Neuropsych/cognition: This patient is capable of making decisions on her own behalf.   6. Skin/Wound Care: Routine skin care checks   7. Fluids/Electrolytes/Nutrition: Routine Is and Os and follow-up chemistries   9: Hyperlipidemia: continue statin   10: Prediabetes: carb conscience diet and monitor with PCP  -Glucose stable on BMP 2/3   11. Central vertigo. Added PRN  meclizine 25 mg QID    LOS: 3 days A FACE TO FACE EVALUATION WAS PERFORMED  Fanny Dance 04/17/2023, 4:31 PM

## 2023-04-18 DIAGNOSIS — H814 Vertigo of central origin: Secondary | ICD-10-CM | POA: Diagnosis not present

## 2023-04-18 DIAGNOSIS — I63542 Cerebral infarction due to unspecified occlusion or stenosis of left cerebellar artery: Secondary | ICD-10-CM | POA: Diagnosis not present

## 2023-04-18 DIAGNOSIS — R278 Other lack of coordination: Secondary | ICD-10-CM | POA: Diagnosis not present

## 2023-04-18 NOTE — Progress Notes (Signed)
 PROGRESS NOTE   Subjective/Complaints:  No issues overnite  ROS: Patient denies CP, SOB, N/V/D   Objective:   No results found.  Recent Labs    04/17/23 0519  WBC 6.1  HGB 13.8  HCT 42.8  PLT 323   Recent Labs    04/17/23 0519  NA 140  K 4.3  CL 106  CO2 26  GLUCOSE 97  BUN 13  CREATININE 0.93  CALCIUM  9.1    Intake/Output Summary (Last 24 hours) at 04/18/2023 0808 Last data filed at 04/18/2023 0749 Gross per 24 hour  Intake 837 ml  Output --  Net 837 ml        Physical Exam: Vital Signs Blood pressure (!) 109/45, pulse 73, temperature 98 F (36.7 C), temperature source Oral, resp. rate 17, height 5' 9.5 (1.765 m), weight 104.3 kg, last menstrual period 12/28/2016, SpO2 98%. General: No acute distress, sitting in bed Mood and affect are appropriate Heart: Regular rate and rhythm no rubs murmurs or extra sounds Lungs: Clear to auscultation, breathing unlabored, no rales or wheezes, on room air Abdomen: Positive bowel sounds, soft nontender to palpation, nondistended Extremities: No clubbing, cyanosis, or edema Skin: No evidence of breakdown, no evidence of rash Neurologic: Cranial nerves II through XII grossly intact, motor strength is 5/5 in bilateral deltoid, bicep, tricep, grip, hip flexor, knee extensors, ankle dorsiflexor and plantar flexor Sensory exam pinprick & temp sensation reduce LUE and LE vs RUE and LE Musculoskeletal: Full range of motion in all 4 extremities. No joint swelling   Assessment/Plan: 1. Functional deficits which require 3+ hours per day of interdisciplinary therapy in a comprehensive inpatient rehab setting. Physiatrist is providing close team supervision and 24 hour management of active medical problems listed below. Physiatrist and rehab team continue to assess barriers to discharge/monitor patient progress toward functional and medical goals  Care Tool:  Bathing     Body parts bathed by patient: Right arm, Left arm, Chest, Abdomen, Front perineal area, Buttocks, Right upper leg, Left upper leg, Right lower leg, Left lower leg, Face         Bathing assist Assist Level: Supervision/Verbal cueing     Upper Body Dressing/Undressing Upper body dressing   What is the patient wearing?: Pull over shirt    Upper body assist Assist Level: Set up assist    Lower Body Dressing/Undressing Lower body dressing      What is the patient wearing?: Underwear/pull up, Pants     Lower body assist Assist for lower body dressing: Supervision/Verbal cueing     Toileting Toileting Toileting Activity did not occur (Clothing management and hygiene only): N/A (no void or bm)  Toileting assist Assist for toileting: Minimal Assistance - Patient > 75%     Transfers Chair/bed transfer  Transfers assist     Chair/bed transfer assist level: Minimal Assistance - Patient > 75%     Locomotion Ambulation   Ambulation assist      Assist level: Minimal Assistance - Patient > 75% Assistive device: No Device Max distance: 239ft   Walk 10 feet activity   Assist     Assist level: Minimal Assistance - Patient > 75% Assistive  device: No Device   Walk 50 feet activity   Assist    Assist level: Minimal Assistance - Patient > 75% Assistive device: No Device    Walk 150 feet activity   Assist    Assist level: Minimal Assistance - Patient > 75% Assistive device: No Device    Walk 10 feet on uneven surface  activity   Assist     Assist level: Minimal Assistance - Patient > 75%     Wheelchair     Assist Is the patient using a wheelchair?: No             Wheelchair 50 feet with 2 turns activity    Assist            Wheelchair 150 feet activity     Assist          Blood pressure (!) 109/45, pulse 73, temperature 98 F (36.7 C), temperature source Oral, resp. rate 17, height 5' 9.5 (1.765 m), weight 104.3  kg, last menstrual period 12/28/2016, SpO2 98%.  Medical Problem List and Plan: 1. 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  Antithrombotics: -DVT/anticoagulation:  Pharmaceutical: Lovenox              -antiplatelet therapy: Aspirin  and Plavix  for three weeks followed by aspirin  alone   3. Pain Management: Tylenol  as needed   4. Mood/Behavior/Sleep: LCSW to evaluate and provide emotional support             -antipsychotic agents: n/a   5. Neuropsych/cognition: This patient is capable of making decisions on her own behalf.   6. Skin/Wound Care: Routine skin care checks   7. Fluids/Electrolytes/Nutrition: Routine Is and Os and follow-up chemistries   9: Hyperlipidemia: continue statin   10: Prediabetes: carb conscience diet and monitor with PCP  -Glucose stable on BMP 2/3   11. Central vertigo. Added PRN  meclizine  25 mg QID    LOS: 4 days A FACE TO FACE EVALUATION WAS PERFORMED  Prentice FORBES Compton 04/18/2023, 8:08 AM

## 2023-04-18 NOTE — Plan of Care (Signed)
  Problem: Consults Goal: RH STROKE PATIENT EDUCATION Description: See Patient Education module for education specifics  Outcome: Progressing   Problem: RH BOWEL ELIMINATION Goal: RH STG MANAGE BOWEL WITH ASSISTANCE Description: STG Manage Bowel with mod I Assistance. Outcome: Progressing Goal: RH STG MANAGE BOWEL W/MEDICATION W/ASSISTANCE Description: STG Manage Bowel with Medication with modI Assistance. Outcome: Progressing   Problem: RH SAFETY Goal: RH STG ADHERE TO SAFETY PRECAUTIONS W/ASSISTANCE/DEVICE Description: STG Adhere to Safety Precautions With cues Assistance/Device. Outcome: Progressing   Problem: RH PAIN MANAGEMENT Goal: RH STG PAIN MANAGED AT OR BELOW PT'S PAIN GOAL Description: < 4 with prns Outcome: Progressing   Problem: RH KNOWLEDGE DEFICIT Goal: RH STG INCREASE KNOWLEDGE OF DIABETES Description: Patient and daughter will be able to manage prediabetes using educational resources for medications and dietary modification independently Outcome: Progressing Goal: RH STG INCREASE KNOWLEDGE OF HYPERTENSION Description: Patient and daughter will be able to manage HTN using educational resources for medications and dietary modification independently Outcome: Progressing Goal: RH STG INCREASE KNOWLEGDE OF HYPERLIPIDEMIA Description: Patient and daughter will be able to manage HLD using educational resources for medications and dietary modification independently Outcome: Progressing Goal: RH STG INCREASE KNOWLEDGE OF STROKE PROPHYLAXIS Description: Patient and daughter will be able to manage secondary risks using educational resources for medications and dietary modification independently Outcome: Progressing   Problem: RH Vision Goal: RH LTG Vision (Specify) Outcome: Progressing

## 2023-04-18 NOTE — Progress Notes (Signed)
 Occupational Therapy Session Note  Patient Details  Name: Brittney Yu MRN: [id number] Date of Birth: February 09, 1966  Today's Date: 04/18/2023 Session 1:  OT Individual Time: 9082-8984 OT Individual Time Calculation (min): 58 min   Session 2: OT Individual Time: 8584-8472 OT Individual Time Calculation (min): 72 min   Short Term Goals: Week 1:  OT Short Term Goal 1 (Week 1): STG=LTG due to LOS  Skilled Therapeutic Interventions/Progress Updates:     AM Session: Pt received sitting up in wc presenting to be in good spirits receptive to skilled OT session reporting 0/10 pain- OT offering intermittent rest breaks, repositioning, and therapeutic support to optimize participation in therapy session. Pt reporting she has been experiencing increased dizziness when transitioning from sitting to standing position with education provided on orthostatic hypotension symptoms and management. Assessed Pt's vitals at beginning of session with Pt presenting negative for [name]- will continue to monitor. Dizziness most likely caused by location of CVA vs OH.  Vitals Sitting: BP 120/53 (71) HR 69 Vitals Standing: BP 120/68 (84) HR 86 Pt requesting to take shower this AM. Focused session on BADL retraining, activity tolerance, and balance within context of shower level BADLs. Pt completed functional mobility wc>closet without AD to retrieve clean clothing from closet CGA without LOB noted. Pt completed functional mobility to bathroom no AD and completed ambulatory transfer to walk-in shower CGA- increased lateral weight shifting noted, decreased gait speed, however no LOB. Pt doffed clothing seated on TTB with supervision. Pt able to complete U/LB bathing with distant supervision provided for safety only- Pt demonstrating appropriate safety awareness and application of energy conservation techniques this session. Following shower, Pt completed U/LB dressing seated on BSC with close supervision- Pt weaved B LEs into  pants by leaning towards ground and utilized grab bars for balance when bringing pants to waist. Pt completed functional mobility to sink without AD and completed grooming/hygiene tasks in standing without AD for increased balance challenge. Pt able to manipulate small toiletry items without dropping and without increased effort this session. Pt was left resting in wc with call bell in reach and all needs met.    PM Session:  Pt received sitting up in wc, dressed and ready for the day upon OT arrival. Pt presenting to be in good spirits receptive to skilled OT session reporting 0/10 pain- OT offering intermittent rest breaks, repositioning, and therapeutic support to optimize participation in therapy session. Pt requesting to use restroom at beginning of therapy session. Pt completed functional mobility to bathroom no AD with CGA and completed 3/3 toileting tasks with close supervision following continent void. Pt ambulated to sink and completed hand washing in standing position with close supervision no LOB. Remainder of therapy session completed outdoors to support improved moral and change in location. Pt ambulated >200 ft without AD to outdoor location with CGA- Pt with mild lateral LOB, however able to correct with CGA only. Engaged Pt in simulated community mobility activities to challenge Pt's balance, coordination, activity tolerance, and depth perception. Pt able to ambulate across uneven sidewalks, over curbs, up/down stairs x2 trials, and up/down inclines with CGA provided overall, verbal cues required for head positioning as Pt tends to have downward gaze. Pt able to appropriately request rest breaks and demonstrates good awareness into her deficits, noticing when she feels fatigued or off balance. Pt able to navigate stairs using step to pattern with CGA using rail. Pt required increased rest breaks following mobility and functional tasks. During rest breaks, provided  education on fall prevention,  energy conservation techniques, CVA recovery process, follow-up therapy options, and DME recommendations with Pt receptive to all education provide and inquisitive to learn more about CVA prevention and recovery. Standing at wall, engaged Pt in completing side stepping activities to simulate stepping over side of shower or tub. Began task with low knee side steps with pt able to complete 5 R<>L with close supervision to CGA and then graded up task to incorporate high knee stepping with Pt able to complete 5 R<>L with CGA single LOB noted and min A provided to correct. Pt with improved single leg standing balance and coordination moving in the frontal plane. Pt ambulated back to room CGA no AD with no LOB noted +increased time. Pt was left resting in wc with call bell in reach and all needs met.    Therapy Documentation Precautions:  Precautions Precautions: Fall, Other (comment) Precaution Comments: diplopia, dizziness, R lateral lean Restrictions Weight Bearing Restrictions Per Provider Order: No  Therapy/Group: Individual Therapy  Brittney Yu 04/18/2023, 7:52 AM

## 2023-04-19 DIAGNOSIS — H814 Vertigo of central origin: Secondary | ICD-10-CM | POA: Diagnosis not present

## 2023-04-19 DIAGNOSIS — I63542 Cerebral infarction due to unspecified occlusion or stenosis of left cerebellar artery: Secondary | ICD-10-CM | POA: Diagnosis not present

## 2023-04-19 DIAGNOSIS — R278 Other lack of coordination: Secondary | ICD-10-CM | POA: Diagnosis not present

## 2023-04-19 NOTE — Progress Notes (Signed)
 PROGRESS NOTE   Subjective/Complaints:  Discussed stroke location  Occ increased dizziness and right facial parasthesia  , discussed CVA vs BP related dizziness ROS: Patient denies CP, SOB, N/V/D   Objective:   No results found.  Recent Labs    04/17/23 0519  WBC 6.1  HGB 13.8  HCT 42.8  PLT 323   Recent Labs    04/17/23 0519  NA 140  K 4.3  CL 106  CO2 26  GLUCOSE 97  BUN 13  CREATININE 0.93  CALCIUM  9.1    Intake/Output Summary (Last 24 hours) at 04/19/2023 0746 Last data filed at 04/19/2023 0737 Gross per 24 hour  Intake 951 ml  Output --  Net 951 ml        Physical Exam: Vital Signs Blood pressure (!) 102/43, pulse 64, temperature 97.9 F (36.6 C), resp. rate 17, height 5' 9.5 (1.765 m), weight 104.3 kg, last menstrual period 12/28/2016, SpO2 98%. General: No acute distress, sitting in bed Mood and affect are appropriate Heart: Regular rate and rhythm no rubs murmurs or extra sounds Lungs: Clear to auscultation, breathing unlabored, no rales or wheezes, on room air Abdomen: Positive bowel sounds, soft nontender to palpation, nondistended Extremities: No clubbing, cyanosis, or edema Skin: No evidence of breakdown, no evidence of rash Neurologic: Cranial nerves II through XII grossly intact, motor strength is 5/5 in bilateral deltoid, bicep, tricep, grip, hip flexor, knee extensors, ankle dorsiflexor and plantar flexor Sensory exam pinprick & temp sensation reduce LUE and LE vs RUE and LE Musculoskeletal: Full range of motion in all 4 extremities. No joint swelling   Assessment/Plan: 1. Functional deficits which require 3+ hours per day of interdisciplinary therapy in a comprehensive inpatient rehab setting. Physiatrist is providing close team supervision and 24 hour management of active medical problems listed below. Physiatrist and rehab team continue to assess barriers to discharge/monitor  patient progress toward functional and medical goals  Care Tool:  Bathing    Body parts bathed by patient: Right arm, Left arm, Chest, Abdomen, Front perineal area, Buttocks, Right upper leg, Left upper leg, Right lower leg, Left lower leg, Face         Bathing assist Assist Level: Supervision/Verbal cueing     Upper Body Dressing/Undressing Upper body dressing   What is the patient wearing?: Pull over shirt    Upper body assist Assist Level: Set up assist    Lower Body Dressing/Undressing Lower body dressing      What is the patient wearing?: Underwear/pull up, Pants     Lower body assist Assist for lower body dressing: Supervision/Verbal cueing     Toileting Toileting Toileting Activity did not occur (Clothing management and hygiene only): N/A (no void or bm)  Toileting assist Assist for toileting: Minimal Assistance - Patient > 75%     Transfers Chair/bed transfer  Transfers assist     Chair/bed transfer assist level: Minimal Assistance - Patient > 75%     Locomotion Ambulation   Ambulation assist      Assist level: Minimal Assistance - Patient > 75% Assistive device: No Device Max distance: 267ft   Walk 10 feet activity   Assist  Assist level: Minimal Assistance - Patient > 75% Assistive device: No Device   Walk 50 feet activity   Assist    Assist level: Minimal Assistance - Patient > 75% Assistive device: No Device    Walk 150 feet activity   Assist    Assist level: Minimal Assistance - Patient > 75% Assistive device: No Device    Walk 10 feet on uneven surface  activity   Assist     Assist level: Minimal Assistance - Patient > 75%     Wheelchair     Assist Is the patient using a wheelchair?: No             Wheelchair 50 feet with 2 turns activity    Assist            Wheelchair 150 feet activity     Assist          Blood pressure (!) 102/43, pulse 64, temperature 97.9 F (36.6  C), resp. rate 17, height 5' 9.5 (1.765 m), weight 104.3 kg, last menstrual period 12/28/2016, SpO2 98%.  Medical Problem List and Plan: 1. 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  Antithrombotics: -DVT/anticoagulation:  Pharmaceutical: Lovenox              -antiplatelet therapy: Aspirin  and Plavix  for three weeks followed by aspirin  alone   3. Pain Management: Tylenol  as needed   4. Mood/Behavior/Sleep: LCSW to evaluate and provide emotional support             -antipsychotic agents: n/a   5. Neuropsych/cognition: This patient is capable of making decisions on her own behalf.   6. Skin/Wound Care: Routine skin care checks   7. Fluids/Electrolytes/Nutrition: Routine Is and Os and follow-up chemistries   9: Hyperlipidemia: continue statin   10: Prediabetes: carb conscience diet and monitor with PCP  -Glucose stable on BMP 2/3   11. Central vertigo. Added PRN  meclizine  25 mg QID    LOS: 5 days A FACE TO FACE EVALUATION WAS PERFORMED  Prentice FORBES Compton 04/19/2023, 7:46 AM

## 2023-04-19 NOTE — Progress Notes (Addendum)
 Physical Therapy Session Note  Patient Details  Name: Brittney Yu MRN: [id number] Date of Birth: 1965/08/23  Today's Date: 04/19/2023 PT Individual Time: 8650-8552 PT Individual Time Calculation (min): 58 min   Short Term Goals: Week 1:  PT Short Term Goal 1 (Week 1): = to LTGs based on ELOS  Skilled Therapeutic Interventions/Progress Updates:      Therapy Documentation Precautions:  Precautions Precautions: Fall, Other (comment) Precaution Comments: diplopia, dizziness, R lateral lean Restrictions Weight Bearing Restrictions Per Provider Order: No  Vestibular Evaluation & Treatment  Subjective:   Onset: exacerbated with right medulla and cerebellar CVA   Duration: <30 seconds   Symptoms: room spinning with onset of infarcts, nausea, imbalance, right eye pressure, facial numbness  Denies visual deficits, tinnitus, floating sensation  History: frequent ear infections as child, dizziness when stop moving in early adulthood  Aggravating factors: head movement in lateral, vertical, and rotational manner; downward gaze; supine>sidelying  Easing factors: lying down, close eyes, focus gaze on target    Objective:   Ocular ROM: WFL  Smooth pursuits: WFL  Saccades: WFL; one corrective left lateral saccade   Motion Sensitivity: Positive   FGA sub-sections: difficulty with gait with vertical and lateral head motions( vertical worse than lateral) and difficulty with speed modification (slowing down) with gait   Limited Cervical ROM with rotation significant  Assessment:   Pt presents with VOR and gaze stabilization impairments along with cervical ROM deficits due to central vestibular deficits and musculoskeletal impairments. Pt with existing vestibular impairments prior to CVA which are now exacerbated. Pt limits cervical motion specifically rotation along with downward gaze with mobility. Discussed importance of habituation and gaze stabilization training to  decrease symptoms and improve quality of life.   Plan: Initiation of following exercises with dizziness scale (0-10)  VOR x 1  VOR x 2  Increased cervical motion with general mobility  Gait with head turns (vertical and lateral) emphasis on habituation   CGA with all mobility without AD and with rollator in session including transfers and gait >40 ft.   Therapy/Group: Individual Therapy  [name] [name] PT, DPT  04/19/2023, 4:03 PM

## 2023-04-19 NOTE — Progress Notes (Signed)
 Occupational Therapy Session Note  Patient Details  Name: Brittney Yu MRN: [id number] Date of Birth: 11-10-1965  Today's Date: 04/19/2023 OT Individual Time: 9163-9050 OT Individual Time Calculation (min): 73 min    Short Term Goals: Week 1:  OT Short Term Goal 1 (Week 1): STG=LTG due to LOS  Skilled Therapeutic Interventions/Progress Updates:  Pt greeted seated EOB, pt agreeable to OT intervention.      Transfers/bed mobility/functional mobility: pt completed sit>stand from EOB with no AD and close supervision. Pt reports mild dizziness upon standing but not as bad as yesterday. Pt completed slow, guarded ambulatory transfer into bathroom with no AD and CGA.   Therapeutic activity: pt completed various therapeutic activities focused on standing balance. Pt reports feeling like all of her weight in her R foot is on the ball of her foot. Pt able to stand on wedged airex cushion to shift weight posteriorly while pt completing dynamic reaching task with LUE, pt completed task with CGA, mild dizziness reported during task.   Pt completed functional ambulation task in // bars with pt instructed to scoot playing cards to either R of L side during mobility with a focus on standing balance and improving heel/toe stance during functional mobility. Pt completed task with CGA for balance.    ADLs:  Grooming: pt completed standing grooming tasks at sink with supervision UB dressing:pt donned bra and [place] shirt from EOB with set- up assist LB dressing: pt donned underwear from EOB with close supervision for balance when standing to pull underwear to waist line Footwear: pt donned shoes/socks with set- up assist via figure 4 from EOB   Bathing: pt completed bathing seated/standing on TTb with supervision.  Transfers: ambulatory ADL transfer with no AD and close CGA d/t reports of dizziness.  Toileting: pt completed 3/3 toileting tasks with supervision, continent urine void.   Education:  education provided on location of stroke, s/s of strokes and infarct vs hemorraghic. Pt receptive to all education and reinforced education with health resource notebook.    Ended session with pt supine in bed with all needs within reach and bed alarm activated.                    Therapy Documentation Precautions:  Precautions Precautions: Fall, Other (comment) Precaution Comments: diplopia, dizziness, R lateral lean Restrictions Weight Bearing Restrictions Per Provider Order: No  Pain: No pain reported during session    Therapy/Group: Individual Therapy  Ronal Gift [facility] 04/19/2023, 12:02 PM

## 2023-04-19 NOTE — Patient Care Conference (Signed)
 Inpatient RehabilitationTeam Conference and Plan of Care Update Date: 04/19/2023   Time: 10:33 AM    Patient Name: Brittney Yu      Medical Record Number: [id number]  Date of Birth: [birth year] Sex: Female         Room/Bed: 4M07C/4M07C-01 Payor Info: Payor: [place] MEDICAID PREPAID HEALTH PLAN / Plan: [place] MEDICAID HEALTHY BLUE / Product Type: *No Product type* /    Admit Date/Time:  04/14/2023  4:56 PM  Primary Diagnosis:  CVA (cerebral vascular accident) [facility])  Hospital Problems: Principal Problem:   CVA (cerebral vascular accident) [facility])    Expected Discharge Date: Expected Discharge Date: 04/25/23  Team Members Present: Physician leading conference: Dr. Prentice Compton Social Worker Present: Rhoda Clement, LCSW Nurse Present: Barnie Ronde, [place], RN PT Present: Recardo Milliner, PT OT Present: Katheryn Mines, OT SLP Present: Blaise Alderman, SLP     Current Status/Progress Goal Weekly Team Focus  Bowel/Bladder   Patient is continenet of bowel and bladder. Last BM, 2/5.   Patient will remain continent.   Maintain continence.    Swallow/Nutrition/ Hydration               ADL's   supervision UB BADLs, CGA LB BADLs and toileting without AD; Barriers- caregiver for her adult son with CP so need so needs to be mod I at d/c, dizziness, balance   mod I   balance training, BADL retraining, IADL retraining, activity tolerance, DME education, d/c planning    Mobility   Bed mobility = Mod I/ supervision, Transfers = supervision, Ambulation with no AD for >400 ft; lightheadedness experienced with all transitions but does improve over time until 2/4   Mod I overall with SUP for stairs  Barriers: dizziness/ lightheadedness, vertebral artery occlusion, path deviation with head turns /// Work on: overall balance, head turns/ eye movements, safety in transition changes, family ed, gait training    Communication                Safety/Cognition/ Behavioral Observations                Pain   Patient does not express any pain.   Patient will remain pain free.   Maintain pain free status.    Skin   Patient skin in intact.   Patients skin will remain intact.  Maintain skin integrity.      Discharge Planning:  Home with daughter who is currently caring for her twin brother who has CP. Pt was prior to admission. Between both can privide care. Await team's recommendtions for DME and follow up   Team Discussion: Patient admitted post left medullary and right cerebellar CVAs; [place]- like presentation with dizziness, lightheadedness and truncal ataxia, balance instability, decreased temperature sensation on one side and facial paresthesias.   Patient on target to meet rehab goals: yes, currently needs CGA for lower body care and toileting.  Able to ambulate up to ~400' without an assistive device.   *See Care Plan and progress notes for long and short-term goals.   Revisions to Treatment Plan:  Central vestibular evaluation   Teaching Needs: Safety, medications, diet modification recommendations, transfers, toileting, etc.   Current Barriers to Discharge: Decreased caregiver support  Possible Resolutions to Barriers: Family education OP follow up services DME: Rollator     Medical Summary Current Status: occ dizziness, facial parasthesia  Barriers to Discharge: Other (comments)   Possible Resolutions to [employer] Focus: central vertigo, fall risk   Continued Need for Acute  Rehabilitation Level of Care: The patient requires daily medical management by a physician with specialized training in physical medicine and rehabilitation for the following reasons: Direction of a multidisciplinary physical rehabilitation program to maximize functional independence : Yes Medical management of patient stability for increased activity during participation in an intensive rehabilitation regime.: Yes Analysis of laboratory values and/or radiology  reports with any subsequent need for medication adjustment and/or medical intervention. : Yes   I attest that I was present, lead the team conference, and concur with the assessment and plan of the team.   Fredericka Sober B 04/19/2023, 2:16 PM

## 2023-04-19 NOTE — Progress Notes (Signed)
 Physical Therapy Session Note  Patient Details  Name: Brittney Yu MRN: [id number] Date of Birth: [birth year]  Today's Date: 04/18/2023 PT Individual Time:  1532-1605  PT Individual Time Calculation (min): 33 min  Short Term Goals: Week 1:  PT Short Term Goal 1 (Week 1): = to LTGs based on ELOS  Skilled Therapeutic Interventions/Progress Updates:  Patient seated upright in w/c on entrance to room. Patient alert and agreeable to PT session.   Patient with no pain complaint at start of session.  Therapeutic Activity: Transfers: Pt performed sit<>stand and stand pivot transfers throughout session with supervision. Provided vc/ tc for self assessment of dizziness/ lightheadedness.  Gait Training:  Pt ambulated 150 ft x2 using no AD with close supervision. Demonstrated continued increased WBOS from previous days.  Demonstrated decreased weight shift to and time in stance on RLE.  Provided vc/ tc for equalizing stance time R/L and pt able to self correct and improve throughout rest of ambulation.  Neuromuscular Re-ed: NMR facilitated during session with focus on standing balance and eye movements. Pt guided in use of BITS for eye movement testing without head movements. Pt performed rotator visual pursuit task in BITS while standing on Airex pad. Using stylus, pt is able to complete task of finding alphabet on rotating small disk. Initially completes with 96.3% accuracy in 63min11sec and 2.73sec reaction time with 60% accuracy in attending to fixation of random letter change in center of disk.   In second bout, rotation speed increased and pt able to demonstrate learning with improvement to 100% accuracy, completes in with 2.33sec reaction time. 50% accuracy with increased number of fixation changes during task.   Next guided in obstacle course requiring ambulation up/ down ramped foam wedge, on/ off Airex with self perturbations of arm movements, around cones and over large height  obstacle. Is able to perform out/ back with hesitancy in new movements for processing time but no LOB.   NMR performed for improvements in motor control and coordination, balance, sequencing, judgement, and self confidence/ efficacy in performing all aspects of mobility at highest level of independence.   Patient seated upright  at end of session with brakes locked, belt alarm set, and all needs within reach.   Therapy Documentation Precautions:  Precautions Precautions: Fall, Other (comment) Precaution Comments: diplopia, dizziness, R lateral lean Restrictions Weight Bearing Restrictions Per Provider Order: No  Pain:  No pain but does relate decreased but persistent pressure on R aspect of head from eye to ear.    Therapy/Group: Individual Therapy  Mliss DELENA Milliner PT, DPT, CSRS 04/18/2023, 4:50 PM

## 2023-04-19 NOTE — Progress Notes (Signed)
 Physical Therapy Session Note  Patient Details  Name: Brittney Yu MRN: [id number] Date of Birth: 05/26/1965  Today's Date: 04/18/2023 PT Individual Time:  1118-1202  PT Individual Time Calculation (min): 44 min  Short Term Goals: Week 1:  PT Short Term Goal 1 (Week 1): = to LTGs based on ELOS  Skilled Therapeutic Interventions/Progress Updates:  Patient seated upright in w/c on entrance to room. Patient alert and agreeable to PT session.   Patient with no pain complaint at start of session. Pt does c/o increased dizziness so far this morning. Also relates pressure in R side of head from eye and around temporal aspect of head.   Therapeutic Activity: Transfers: Pt performed sit<>stand and stand pivot transfers throughout session with close supervision. No cueing required but informed pt to always take self assessment of dizziness on change in transition. If dizziness does not resolve fairly quickly in new position, then return to previous position.  Gait Training:  Pt ambulated 150' x2 ft using no AD with increased WBOS from previous days.  Demonstrated decreased weight shift to and time in stance on RLE. Provided vc/ tc for higher knee stepping and placing feet slightly more narrow BOS. Pt requires time to process new motor plan. Then able to complete to ortho gym.   Neuromuscular Re-ed: Gentle testing of saccades with pt able to look to each target and no errant movements noted. Then guided in gentle head turn exercise and pt maintaining central eye contact with minimal R beat noted bilaterally. More formal vestibular testing required.   When questioned, pt relates that dizziness/ light headedness decreases if eyes turn toward target first and then head turn initiated.   Allowed pt to rest after eye movement testing. Then guided in quick lateral step over of 2 object. Improves with practice and repetition.   NMR performed for improvements in motor control and coordination, balance,  sequencing, judgement, and self confidence/ efficacy in performing all aspects of mobility at highest level of independence.   Patient seated upright in w/c at end of session with brakes locked, belt alarm set, and all needs within reach.   Therapy Documentation Precautions:  Precautions Precautions: Fall, Other (comment) Precaution Comments: diplopia, dizziness, R lateral lean Restrictions Weight Bearing Restrictions Per Provider Order: No  Pain:  No pain but noted pressure along R side of head. RN notified. Vestibular eval requested without BPPV testing.   Therapy/Group: Individual Therapy  Mliss DELENA Milliner PT, DPT, CSRS 04/18/2023, 12:28 PM

## 2023-04-19 NOTE — Plan of Care (Signed)
  Problem: Consults Goal: RH STROKE PATIENT EDUCATION Description: See Patient Education module for education specifics  Outcome: Progressing   Problem: RH BOWEL ELIMINATION Goal: RH STG MANAGE BOWEL WITH ASSISTANCE Description: STG Manage Bowel with mod I Assistance. Outcome: Progressing Goal: RH STG MANAGE BOWEL W/MEDICATION W/ASSISTANCE Description: STG Manage Bowel with Medication with modI Assistance. Outcome: Progressing   Problem: RH SAFETY Goal: RH STG ADHERE TO SAFETY PRECAUTIONS W/ASSISTANCE/DEVICE Description: STG Adhere to Safety Precautions With cues Assistance/Device. Outcome: Progressing   Problem: RH PAIN MANAGEMENT Goal: RH STG PAIN MANAGED AT OR BELOW PT'S PAIN GOAL Description: < 4 with prns Outcome: Progressing   Problem: RH KNOWLEDGE DEFICIT Goal: RH STG INCREASE KNOWLEDGE OF DIABETES Description: Patient and daughter will be able to manage prediabetes using educational resources for medications and dietary modification independently Outcome: Progressing Goal: RH STG INCREASE KNOWLEDGE OF HYPERTENSION Description: Patient and daughter will be able to manage HTN using educational resources for medications and dietary modification independently Outcome: Progressing Goal: RH STG INCREASE KNOWLEGDE OF HYPERLIPIDEMIA Description: Patient and daughter will be able to manage HLD using educational resources for medications and dietary modification independently Outcome: Progressing Goal: RH STG INCREASE KNOWLEDGE OF STROKE PROPHYLAXIS Description: Patient and daughter will be able to manage secondary risks using educational resources for medications and dietary modification independently Outcome: Progressing   Problem: RH Vision Goal: RH LTG Vision (Specify) Outcome: Progressing

## 2023-04-19 NOTE — Progress Notes (Signed)
 Patient ID: Brittney Yu, female   DOB: January 05, 1966, 58 y.o.   MRN: [id number] Met with pt to give team conference update regarding goals of mod/I level and target discharge date of 2/11. Pt is pleased with and feels OP would be good she lives in [place] will have investigate the resources there. Aware having vestibular evaluation today. Await team's recommendations for equipment.

## 2023-04-19 NOTE — Progress Notes (Signed)
 Physical Therapy Session Note  Patient Details  Name: Brittney Yu MRN: [id number] Date of Birth: Feb 01, 1966  Today's Date: 04/19/2023 PT Individual Time: 8650-8552 PT Individual Time Calculation (min): 58 min   Short Term Goals: Week 1:  PT Short Term Goal 1 (Week 1): = to LTGs based on ELOS  Skilled Therapeutic Interventions/Progress Updates:  Patient supine in bed on entrance to room. Patient alert and agreeable to PT session.   Patient with no pain complaint at start of session. Relates improvement in dizziness/ lightheadedness this day over previous day.   Therapeutic Activity/ Gait Training/ NMR: Bed Mobility: Pt performed supine <> sit with Mod I. Mild change in lightheadedness related. Improves with brief time. Transfers: Pt performed sit<>stand and stand pivot transfers throughout session with distant supervision. Demos good ability to self assess once standing for lightheadedness/ dizziness. Light increase related in rise to stand that improves slightly over brief time. Pt related desire to continue mobilizing to improve symptoms. Continued reminder for turning eyes prior to turning head/ body.   Ambulated to AD room to retrieve rollator for pt. On return ambulation, pt relates increase feel of gait speed and does like UE support for improved confidence. Demos light grasp to rollator with no downward pressure.    Discussion with pt re: chores/ activities that she will need to perform upon return home. Initiated simulation of laundry with collection of towels from around gym and provided with laundry basket for collection. Pt demos ability to carry full basket over ~40 ft total and simulates putting in washer. Retrieves from washer and then places in dryer with no increase in symptoms throughout. Retrieves from dryer and able to fold while seated and replace in basket to return to original locations.   Pt also relates having to pick up son's standard w/c or transport chair and  place in back of car. Used rollator to simulate transport chair. Pt is able to collapse rollator and lift on mat table at height of back of car with use of LLE stabilizing into mat table and with supervision. Also able to remove and replace to floor and setup for use. No symptoms and no LOB.   6 Min Walk Test:  Instructed patient to ambulate as quickly and as safely as possible for 6 minutes using LRAD. Patient was allowed to take standing rest breaks without stopping the test, but if the patient required a sitting rest break, the clock would be stopped and the test would be over.  Results: 720 feet (219.5 meters, Avg speed 0.61 m/s) using a rollator with supervision. Results indicate that the patient has slightly reduced endurance with ambulation compared to age matched norms.  Age Matched Norms: 23-69 yo M: 30 F: 40, 16-79 yo M: 73 F: 471, 24-89 yo M: 417 F: 392 MDC: 58.21 meters (190.98 feet) or 50 meters (ANPTA Core Set of Outcome Measures for Adults with Neurologic Conditions, 2018)  Gait speed indicated as a limited community ambulator.   NMR performed for improvements in motor control and coordination, balance, sequencing, judgement, and self confidence/ efficacy in performing all aspects of mobility at highest level of independence.   Patient seated upright in w/c at end of session with brakes locked, belt alarm set, and all needs within reach.   Therapy Documentation Precautions:  Precautions Precautions: Fall, Other (comment) Precaution Comments: diplopia, dizziness, R lateral lean Restrictions Weight Bearing Restrictions Per Provider Order: No  Pain:  No pain related this session. Mild lightheadedness/ dizziness on changes  in position that resolve over time.   Therapy/Group: Individual Therapy  Mliss DELENA Milliner PT, DPT, CSRS 04/19/2023, 6:34 PM

## 2023-04-20 DIAGNOSIS — H814 Vertigo of central origin: Secondary | ICD-10-CM | POA: Diagnosis not present

## 2023-04-20 DIAGNOSIS — I63542 Cerebral infarction due to unspecified occlusion or stenosis of left cerebellar artery: Secondary | ICD-10-CM | POA: Diagnosis not present

## 2023-04-20 DIAGNOSIS — R278 Other lack of coordination: Secondary | ICD-10-CM | POA: Diagnosis not present

## 2023-04-20 NOTE — Group Note (Signed)
 Patient Details Name: Brittney Yu MRN: [id number] DOB: 10-11-1965 Today's Date: 04/20/2023  Time Calculation: OT Group Time Calculation OT Group Start Time: 1430 OT Group Stop Time: 1530 OT Group Time Calculation (min): 60 min      Group Description: Dance Group: Pt participated in dance group with an emphasis on social interaction, motor planning, increasing overall activity tolerance and bimanual tasks. All songs were selected by group members. Dance moves included AROM of BUE/BLE gross motor movements with an emphasis on building functional endurance.    Individual level documentation: Patient completed group from sitting level. Patientt needed supervision to complete various dance moves with OT providing visual model. Pt able to take rest breaks to accommodate for dizziness symptoms independently as needed.  Patient able to create her own modifications during group.  Pain:  0/10  Precautions:  Felton Katheryn SHAUNNA Leavy 04/20/2023, 3:46 PM

## 2023-04-20 NOTE — Plan of Care (Signed)
  Problem: Consults Goal: RH STROKE PATIENT EDUCATION Description: See Patient Education module for education specifics  Outcome: Progressing   Problem: RH BOWEL ELIMINATION Goal: RH STG MANAGE BOWEL WITH ASSISTANCE Description: STG Manage Bowel with mod I Assistance. Outcome: Progressing Goal: RH STG MANAGE BOWEL W/MEDICATION W/ASSISTANCE Description: STG Manage Bowel with Medication with modI Assistance. Outcome: Progressing   Problem: RH SAFETY Goal: RH STG ADHERE TO SAFETY PRECAUTIONS W/ASSISTANCE/DEVICE Description: STG Adhere to Safety Precautions With cues Assistance/Device. Outcome: Progressing   Problem: RH PAIN MANAGEMENT Goal: RH STG PAIN MANAGED AT OR BELOW PT'S PAIN GOAL Description: < 4 with prns Outcome: Progressing   Problem: RH KNOWLEDGE DEFICIT Goal: RH STG INCREASE KNOWLEDGE OF DIABETES Description: Patient and daughter will be able to manage prediabetes using educational resources for medications and dietary modification independently Outcome: Progressing Goal: RH STG INCREASE KNOWLEDGE OF HYPERTENSION Description: Patient and daughter will be able to manage HTN using educational resources for medications and dietary modification independently Outcome: Progressing Goal: RH STG INCREASE KNOWLEGDE OF HYPERLIPIDEMIA Description: Patient and daughter will be able to manage HLD using educational resources for medications and dietary modification independently Outcome: Progressing Goal: RH STG INCREASE KNOWLEDGE OF STROKE PROPHYLAXIS Description: Patient and daughter will be able to manage secondary risks using educational resources for medications and dietary modification independently Outcome: Progressing   Problem: RH Vision Goal: RH LTG Vision (Specify) Outcome: Progressing

## 2023-04-20 NOTE — Progress Notes (Signed)
 Occupational Therapy Session Note  Patient Details  Name: Brittney Yu MRN: [id number] Date of Birth: 07-Oct-1965  Today's Date: 04/20/2023 OT Individual Time: 8952-8840 OT Individual Time Calculation (min): 72 min    Short Term Goals: Week 1:  OT Short Term Goal 1 (Week 1): STG=LTG due to LOS  Skilled Therapeutic Interventions/Progress Updates:     Pt received sitting up in wc presenting to be in good spirits receptive to skilled OT session reporting 0/10 pain- OT offering intermittent rest breaks, repositioning, and therapeutic support to optimize participation in therapy session. Pt requesting to take shower this AM. Focus this session functional transfer training, BADL retraining, and completing Pt's morning routine to increase overall independence and safety.   Pt completed functional mobility within her to without AD and retrieve clothing items from her closet with close supervision- Pt with mild R lean and moved slowly to decrease dizziness symptoms with no LOB noted. Pt with appropriate safety awareness and body positioning to increase safety when retrieving items from bags positioned in closet.   Pt completed functional mobility to bathroom without AD and transferred to TTB positioned in walk-in shower with close supervision using grab bars. Pt doffed pants from waist in stand and sat to doff shirt and unweave B LE demonstrating intact safety awareness and good problem solving skills. Pt able to complete U/LB bathing with distant supervision this session, standing only to wash her buttock while holding grab bars without LOB. Following shower, Pt able to dry self seated on TTB and complete U/LB dressing donning shirt, pants, and socks with supervision no AD. Pt then stood at sink to complete grooming/hygiene tasks for increased balance and activity tolerance challenge without LOB.   Pt completed functional mobility to/from therapy gym this session distant supervision without LOB, no AD.    Engaged Pt in dynamic standing balance activities to work on lateral weight shifting, protective responses, single leg balance, and dynamic balance to increase safety and independence in IADLs. Pt completed star stepping task alternating stepping R/L LE to specified colored disks positioned in circle around Pt with pt able to complete 2 minutes x 3 trials with CGA to intermittent min A required during third trial d/t task being graded up with increased rate. Pt then completed step taps to cone positioned anterior to Pt completing 2x10 reps on each legs and 2x10 reps alternating R/L with CGA provided for balance and min tactile cues to facilitate increased lateral weight shifting.   Pt transitioned to quadruped position following demonstration with CGA. Engaged Pt in completing reach taps to cones in 4-point position to work VMC, trunk stability, facilitate WB'ing through L UE, and address UB strength deficits. Pt able to maintain balance in this position with close supervision and complete reach taps to cone 2x10 reps on R/L UEs and 2x10 reps alternating R/L. Min ataxia noted and min verbal cues required for trunk positioning.   Pt was left resting in wc with call bell in reach, seatbelt alarm on, and all needs met.    Therapy Documentation Precautions:  Precautions Precautions: Fall, Other (comment) Precaution Comments: diplopia, dizziness, R lateral lean Restrictions Weight Bearing Restrictions Per Provider Order: No  Therapy/Group: Individual Therapy  Brittney Yu 04/20/2023, 7:59 AM

## 2023-04-20 NOTE — Progress Notes (Signed)
 PROGRESS NOTE   Subjective/Complaints:  Discussed d/c date  Continent of bowel and bladder  ROS: Patient denies CP, SOB, N/V/D   Objective:   No results found.  No results for input(s): WBC, HGB, HCT, PLT in the last 72 hours.  No results for input(s): NA, K, CL, CO2, GLUCOSE, BUN, CREATININE, CALCIUM  in the last 72 hours.   Intake/Output Summary (Last 24 hours) at 04/20/2023 0741 Last data filed at 04/19/2023 1803 Gross per 24 hour  Intake 477 ml  Output --  Net 477 ml        Physical Exam: Vital Signs Blood pressure (!) 130/58, pulse 68, temperature 97.8 F (36.6 C), resp. rate 18, height 5' 9.5 (1.765 m), weight 104.3 kg, last menstrual period 12/28/2016, SpO2 99%. General: No acute distress, sitting in bed Mood and affect are appropriate Heart: Regular rate and rhythm no rubs murmurs or extra sounds Lungs: Clear to auscultation, breathing unlabored, no rales or wheezes, on room air Abdomen: Positive bowel sounds, soft nontender to palpation, nondistended Extremities: No clubbing, cyanosis, or edema Skin: No evidence of breakdown, no evidence of rash Neurologic: Cranial nerves II through XII grossly intact, motor strength is 5/5 in bilateral deltoid, bicep, tricep, grip, hip flexor, knee extensors, ankle dorsiflexor and plantar flexor Sensory exam pinprick & temp sensation reduce LUE and LE vs RUE and LE Musculoskeletal: Full range of motion in all 4 extremities. No joint swelling   Assessment/Plan: 1. Functional deficits which require 3+ hours per day of interdisciplinary therapy in a comprehensive inpatient rehab setting. Physiatrist is providing close team supervision and 24 hour management of active medical problems listed below. Physiatrist and rehab team continue to assess barriers to discharge/monitor patient progress toward functional and medical goals  Care Tool:  Bathing     Body parts bathed by patient: Right arm, Left arm, Chest, Abdomen, Front perineal area, Buttocks, Right upper leg, Left upper leg, Right lower leg, Left lower leg, Face         Bathing assist Assist Level: Supervision/Verbal cueing     Upper Body Dressing/Undressing Upper body dressing   What is the patient wearing?: Pull over shirt    Upper body assist Assist Level: Set up assist    Lower Body Dressing/Undressing Lower body dressing      What is the patient wearing?: Underwear/pull up, Pants     Lower body assist Assist for lower body dressing: Supervision/Verbal cueing     Toileting Toileting Toileting Activity did not occur (Clothing management and hygiene only): N/A (no void or bm)  Toileting assist Assist for toileting: Minimal Assistance - Patient > 75%     Transfers Chair/bed transfer  Transfers assist     Chair/bed transfer assist level: Minimal Assistance - Patient > 75%     Locomotion Ambulation   Ambulation assist      Assist level: Minimal Assistance - Patient > 75% Assistive device: No Device Max distance: 255ft   Walk 10 feet activity   Assist     Assist level: Minimal Assistance - Patient > 75% Assistive device: No Device   Walk 50 feet activity   Assist    Assist level: Minimal Assistance -  Patient > 75% Assistive device: No Device    Walk 150 feet activity   Assist    Assist level: Minimal Assistance - Patient > 75% Assistive device: No Device    Walk 10 feet on uneven surface  activity   Assist     Assist level: Minimal Assistance - Patient > 75%     Wheelchair     Assist Is the patient using a wheelchair?: No             Wheelchair 50 feet with 2 turns activity    Assist            Wheelchair 150 feet activity     Assist          Blood pressure (!) 130/58, pulse 68, temperature 97.8 F (36.6 C), resp. rate 18, height 5' 9.5 (1.765 m), weight 104.3 kg, last menstrual period  12/28/2016, SpO2 99%.  Medical Problem List and Plan: 1. 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  Antithrombotics: -DVT/anticoagulation:  Pharmaceutical: Lovenox              -antiplatelet therapy: Aspirin  and Plavix  for three weeks followed by aspirin  alone   3. Pain Management: Tylenol  as needed   4. Mood/Behavior/Sleep: LCSW to evaluate and provide emotional support             -antipsychotic agents: n/a   5. Neuropsych/cognition: This patient is capable of making decisions on her own behalf.   6. Skin/Wound Care: Routine skin care checks   7. 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

## 2023-04-20 NOTE — Progress Notes (Signed)
 Physical Therapy Session Note  Patient Details  Name: Brittney Yu MRN: [id number] Date of Birth: 06/25/1965  Today's Date: 04/20/2023 PT Individual Time: 0815-0925 PT Individual Time Calculation (min): 70 min   Short Term Goals: Week 1:  PT Short Term Goal 1 (Week 1): = to LTGs based on ELOS  Skilled Therapeutic Interventions/Progress Updates:    Chart reviewed and pt agreeable to therapy. Pt received semi-reclined in bed with no c/o pain. Session focused on functional transfers and amb to promote safe home access and gaze stabilization exercise in static positions and with functional movement to reduce dizziness. Pt initiated session with amb to toilet and back to bed using CGA + no AD and S for peri-care. Pt then completed blocked practice of VOR x1 and VOR x2 exercises in up/down and left/right patterns. Pt noted to have good recall of up/down VOR x1, but required education for VOR x1 left/right and VOR x2. Pt noted to have good stabilization with VOR x1 and pace ~40 bpm. Pt educated about slowly increasing pace. Pt required slower pace for VOR x2, but was able to maintain exercise for 1 minute. Pt then amb to therapy gym using S + rollator. Pt completed blocked practice of amb with head rotation and up/down movement with S + RW. Pt then completed static stand with ball toss for 3x 1 min with S for balance. Pt noted small increase in dizziness that subsided in <1 min with rest. Pt then completed functional task of transferring items from one bin to another in standing to chanllenge gaze stabilization during simulated home activities, with pt able to complete using S. Session education emphasized monitoring of dizziness during vestibular exercise and exercise to cover eyes and reduce dizziness as needed. At end of session, pt was left seated in [facility] with alarm engaged, nurse call bell and all needs in reach.     Therapy Documentation Precautions:  Precautions Precautions: Fall, Other  (comment) Precaution Comments: diplopia, dizziness, R lateral lean Restrictions Weight Bearing Restrictions Per Provider Order: No General:      Therapy/Group: Individual Therapy  Warrick KANDICE Raspberry, PT, DPT 04/20/2023, 12:25 PM

## 2023-04-21 DIAGNOSIS — I63542 Cerebral infarction due to unspecified occlusion or stenosis of left cerebellar artery: Secondary | ICD-10-CM | POA: Diagnosis not present

## 2023-04-21 DIAGNOSIS — R278 Other lack of coordination: Secondary | ICD-10-CM | POA: Diagnosis not present

## 2023-04-21 DIAGNOSIS — H814 Vertigo of central origin: Secondary | ICD-10-CM | POA: Diagnosis not present

## 2023-04-21 NOTE — Progress Notes (Signed)
 Patient ID: Brittney Yu, female   DOB: 1965/04/09, 58 y.o.   MRN: [id number]  Discussed with pt OPPT and OT and if needs vestibular will need to go to [facility] instead of [facility]. She is agreeable to this and has requested Brittney Yu and will add to fax script and send. Aware they will reached out to her and set up follow up appointments. She really needs vestibular therapy. Have ordered a rollator for her will see if tall enough for her when delivered.

## 2023-04-21 NOTE — Discharge Instructions (Addendum)
 Inpatient Rehab Discharge Instructions  Brittney Yu Discharge date and time: 04/25/2023  Activities/Precautions/ Functional Status: Activity: no lifting, driving, or strenuous exercise until cleared by MD Diet: cardiac diet Wound Care: none needed Functional status:  ___ No restrictions     ___ Walk up steps independently ___ 24/7 supervision/assistance   ___ Walk up steps with assistance _x__ Intermittent supervision/assistance  ___ Bathe/dress independently ___ Walk with walker     ___ Bathe/dress with assistance ___ Walk Independently    ___ Shower independently ___ Walk with assistance    __x_ Shower with assistance _x__ No alcohol     ___ Return to work/school ________  Special Instructions: No driving, alcohol consumption or tobacco use.   COMMUNITY REFERRALS UPON DISCHARGE:    Outpatient: PT  &    OT             Agency:[facility] [address] [place] [address] Phone:[phone]              Appointment Date/Time:WILL CALL TO SET UP FOLLOW UP APPOINTMENTS  Medical Equipment/Items Ordered:WILL GET ROLLATOR ON OWN DUE TO WILL NEED A TALL ONE                                                 Agency/Supplier:NA  STROKE/TIA DISCHARGE INSTRUCTIONS SMOKING Cigarette smoking nearly doubles your risk of having a stroke & is the single most alterable risk factor  If you smoke or have smoked in the last 12 months, you are advised to quit smoking for your health. Most of the excess cardiovascular risk related to smoking disappears within a year of stopping. Ask you doctor about anti-smoking medications  Quit Line: 1-800-QUIT NOW Free Smoking Cessation Classes [phone]  CHOLESTEROL Know your levels; limit fat & cholesterol in your diet  Lipid Panel     Component Value Date/Time   CHOL 201 (H) 04/12/2023 0142   TRIG 46 04/12/2023 0142   HDL 79 04/12/2023 0142   CHOLHDL 2.5 04/12/2023 0142   VLDL 9 04/12/2023 0142   LDLCALC 113 (H) 04/12/2023 0142      Many patients benefit from treatment even if their cholesterol is at goal. Goal: Total Cholesterol (CHOL) less than 160 Goal:  Triglycerides (TRIG) less than 150 Goal:  HDL greater than 40 Goal:  LDL (LDLCALC) less than 100   BLOOD PRESSURE American Stroke Association blood pressure target is less that 120/80 mm/Hg  Your discharge blood pressure is:  BP: (!) 111/51 Monitor your blood pressure Limit your salt and alcohol intake Many individuals will require more than one medication for high blood pressure  DIABETES (A1c is a blood sugar average for last 3 months) Goal HGBA1c is under 7% (HBGA1c is blood sugar average for last 3 months)  Diabetes: No known diagnosis of diabetes    Lab Results  Component Value Date   HGBA1C 5.7 (H) 04/12/2023    Your HGBA1c can be lowered with medications, healthy diet, and exercise. Check your blood sugar as directed by your physician Call your physician if you experience unexplained or low blood sugars.  PHYSICAL ACTIVITY/REHABILITATION Goal is 30 minutes at least 4 days per week  Activity: Increase activity slowly, Therapies: Physical Therapy: Outpatient and Occupational Therapy: Outpatient Return to work: when cleared by MD Activity decreases your risk of heart attack and stroke and  makes your heart stronger.  It helps control your weight and blood pressure; helps you relax and can improve your mood. Participate in a regular exercise program. Talk with your doctor about the best form of exercise for you (dancing, walking, swimming, cycling).  DIET/WEIGHT Goal is to maintain a healthy weight  Your discharge diet is:  Diet Order             Diet Heart Room service appropriate? Yes; Fluid consistency: Thin  Diet effective now                  thin liquids Your height is:  Height: 5' 9.5 (176.5 cm) Your current weight is: Weight: 104.3 kg Your Body Mass Index (BMI) is:  BMI (Calculated): 33.48 Following the type of diet specifically  designed for you will help prevent another stroke. Your goal weight range is:   Your goal Body Mass Index (BMI) is 19-24. Healthy food habits can help reduce 3 risk factors for stroke:  High cholesterol, hypertension, and excess weight.  RESOURCES Stroke/Support Group:  Call [phone]   STROKE EDUCATION PROVIDED/REVIEWED AND GIVEN TO PATIENT Stroke warning signs and symptoms How to activate emergency medical system (call 911). Medications prescribed at discharge. Need for follow-up after discharge. Personal risk factors for stroke. Pneumonia vaccine given: No Flu vaccine given: No My questions have been answered, the writing is legible, and I understand these instructions.  I will adhere to these goals & educational materials that have been provided to me after my discharge from the hospital.     My questions have been answered and I understand these instructions. I will adhere to these goals and the provided educational materials after my discharge from the hospital.  Patient/Caregiver Signature _______________________________ Date __________  Clinician Signature _______________________________________ Date __________  Please bring this form and your medication list with you to all your follow-up doctor's appointments.

## 2023-04-21 NOTE — Progress Notes (Signed)
 Occupational Therapy Session Note  Patient Details  Name: Brittney Yu MRN: [id number] Date of Birth: [birth year]  Today's Date: 04/21/2023 OT Individual Time: 0832-0929 OT Individual Time Calculation (min): 57 min    Short Term Goals: Week 1:  OT Short Term Goal 1 (Week 1): STG=LTG due to LOS  Skilled Therapeutic Interventions/Progress Updates:     Pt received semi-reclined in bed presenting to be in good spirits stating I am starting to feel more like myself, receptive to skilled OT session reporting 0/10 pain- OT offering intermittent rest breaks, repositioning, and therapeutic support to optimize participation in therapy session. RN in/out at beginning of therapy session to provide morning medications. Pt requesting to take shower this AM- focus this session BADL retraining completing Pt's morning routine to increase safety and independence upon d/c and dynamic balance training.  Mobility:  -Bed mobility: Supine > EOB with bed flattened to simulate home set-up mod I.  -Functional mobility: Pt completed functional mobility EOB > closet to retreive clothing > bathroom no AD with close supervision no LOB. Pt completed functional mobility <> therapy gym no AD with distance supervision no AD ~150 ft x2 trials.  -Shower transfer: Ambulatory transfer no AD using grab bar supervision  BADLs:  -Bathing/Dressing: Pt doffed clothing in seated position on TTB mod I- Pt chose to sit and complete lateral leans to doff pants for increased safety demonstrating intact awareness into her balance deficits. Pt completed U/LB bathing with set-up assist this session, standing to wash buttocks using grab bars and crossing legs into figure-four position to wash lower B LEs and feet. Following shower, Pt able to dry self and donn clean clothes sitting on TTB no AD and standing while holding grab bar to bring pants to waist with supervision.  -Home management: Following shower, Pt able to retrieve dirty towels  from ground and place then in laundry basket with supervision no LOB.  -Grooming/hygiene: Pt completed oral care, applied lotion, applied deodorant, and groomed hair standing at sink without AD for increased balance and endurance challenge with distance supervision no AD.   Therapeutic Activity:  - Engaged Pt in series of dual tasking dynamic standing balance activities with visual scanning and visual fixation incorporated into tasks to increase Pt's safety and balance during IADLs. Pt completed the following activities on BITs system while standing on a compliant surface without AD with CGA and min verbal cues required to locate targets:  -Visual Scanning>single target/user paced: Pt instructed to maintain visual fixation on central target while utilizing peripheral vision to locate and touch changing target on screen. Accuracy 51.51%, reaction time 3.12 sec, Hits 19 -Visual Scanning>complex array>sequencing: Pt tasked with sequencing numbers and letters in order alternating between numbers/letters while fixating gaze on target in the center of the screen.  General results- Accuracy 79.31%, time to complete 4:33, reaction time 5.94 sec Fixation results: Accuracy 83.33%, changes #6, attended 5, unattended 1  Pt was left resting in wc with call bell in reach, seatbelt alarm on, and all needs met.    Therapy Documentation Precautions:  Precautions Precautions: Fall, Other (comment) Precaution Comments: diplopia, dizziness, R lateral lean Restrictions Weight Bearing Restrictions Per Provider Order: No   Therapy/Group: Individual Therapy  Katheryn SHAUNNA [place] 04/21/2023, 7:51 AM

## 2023-04-21 NOTE — Progress Notes (Signed)
 PROGRESS NOTE   Subjective/Complaints:  Continent of bowel and bladder , but reports loose stool yesterday ROS: Patient denies CP, SOB, N/V/D   Objective:   No results found.  No results for input(s): WBC, HGB, HCT, PLT in the last 72 hours.  No results for input(s): NA, K, CL, CO2, GLUCOSE, BUN, CREATININE, CALCIUM  in the last 72 hours.   Intake/Output Summary (Last 24 hours) at 04/21/2023 0742 Last data filed at 04/21/2023 0731 Gross per 24 hour  Intake 598 ml  Output --  Net 598 ml        Physical Exam: Vital Signs Blood pressure (!) 123/57, pulse 63, temperature 97.7 F (36.5 C), temperature source Oral, resp. rate 18, height 5' 9.5 (1.765 m), weight 104.3 kg, last menstrual period 12/28/2016, SpO2 99%. General: No acute distress, sitting in bed Mood and affect are appropriate Heart: Regular rate and rhythm no rubs murmurs or extra sounds Lungs: Clear to auscultation, breathing unlabored, no rales or wheezes, on room air Abdomen: Positive bowel sounds, soft nontender to palpation, nondistended Extremities: No clubbing, cyanosis, or edema Skin: No evidence of breakdown, no evidence of rash Neurologic: Cranial nerves II through XII grossly intact, motor strength is 5/5 in bilateral deltoid, bicep, tricep, grip, hip flexor, knee extensors, ankle dorsiflexor and plantar flexor Sensory exam pinprick & temp sensation reduce LUE and LE vs RUE and LE Musculoskeletal: Full range of motion in all 4 extremities. No joint swelling   Assessment/Plan: 1. Functional deficits which require 3+ hours per day of interdisciplinary therapy in a comprehensive inpatient rehab setting. Physiatrist is providing close team supervision and 24 hour management of active medical problems listed below. Physiatrist and rehab team continue to assess barriers to discharge/monitor patient progress toward functional and  medical goals  Care Tool:  Bathing    Body parts bathed by patient: Right arm, Left arm, Chest, Abdomen, Front perineal area, Buttocks, Right upper leg, Left upper leg, Right lower leg, Left lower leg, Face         Bathing assist Assist Level: Supervision/Verbal cueing     Upper Body Dressing/Undressing Upper body dressing   What is the patient wearing?: Pull over shirt    Upper body assist Assist Level: Set up assist    Lower Body Dressing/Undressing Lower body dressing      What is the patient wearing?: Underwear/pull up, Pants     Lower body assist Assist for lower body dressing: Supervision/Verbal cueing     Toileting Toileting Toileting Activity did not occur (Clothing management and hygiene only): N/A (no void or bm)  Toileting assist Assist for toileting: Minimal Assistance - Patient > 75%     Transfers Chair/bed transfer  Transfers assist     Chair/bed transfer assist level: Minimal Assistance - Patient > 75%     Locomotion Ambulation   Ambulation assist      Assist level: Minimal Assistance - Patient > 75% Assistive device: No Device Max distance: 267ft   Walk 10 feet activity   Assist     Assist level: Minimal Assistance - Patient > 75% Assistive device: No Device   Walk 50 feet activity   Assist  Assist level: Minimal Assistance - Patient > 75% Assistive device: No Device    Walk 150 feet activity   Assist    Assist level: Minimal Assistance - Patient > 75% Assistive device: No Device    Walk 10 feet on uneven surface  activity   Assist     Assist level: Minimal Assistance - Patient > 75%     Wheelchair     Assist Is the patient using a wheelchair?: No             Wheelchair 50 feet with 2 turns activity    Assist            Wheelchair 150 feet activity     Assist          Blood pressure (!) 123/57, pulse 63, temperature 97.7 F (36.5 C), temperature source Oral, resp. rate  18, height 5' 9.5 (1.765 m), weight 104.3 kg, last menstrual period 12/28/2016, SpO2 99%.  Medical Problem List and Plan: 1. 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  Antithrombotics: -DVT/anticoagulation:  Pharmaceutical: Lovenox              -antiplatelet therapy: Aspirin  and Plavix  for three weeks followed by aspirin  alone   3. Pain Management: Tylenol  as needed   4. Mood/Behavior/Sleep: LCSW to evaluate and provide emotional support             -antipsychotic agents: n/a   5. Neuropsych/cognition: This patient is capable of making decisions on her own behalf.   6. Skin/Wound Care: Routine skin care checks   7. 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 Added PRN  meclizine  25 mg QID- not taking will d/c   12.  Loose stool yesterday , will monitor , no laxative use LOS: 7 days A FACE TO FACE EVALUATION WAS PERFORMED  Prentice FORBES Compton 04/21/2023, 7:42 AM

## 2023-04-21 NOTE — Group Note (Signed)
 Patient Details Name: Brittney Yu MRN: [id number] DOB: 11-12-1965 Today's Date: 04/21/2023  Time Calculation: OT Group Time Calculation OT Group Start Time: 1030 OT Group Stop Time: 1130 OT Group Time Calculation (min): 60 min      Group Description: Stress management: Pt participated in group session with a focus on stress mgmt, education provided on healthy coping strategies, and social interaction. Focus of session on providing coping strategies to manage new diagnosis to allow for improved mental health to increase overall quality of life . Discussed how to break down stressors into "daily hassles," "major life stressors" and "life circumstances" in an effort to allow pts to chunk their stressors into groups and determine where to best put their efforts/time when dealing with stress. Provided active listening, emotional support and therapeutic use of self. Offered education on factors that protect us  against stress such as "daily uplifts," "healthy coping strategies" and "protective factors." Encouraged all group members to make an effort to actively recall one event from their day that was a daily uplift in an effort to protect their mindset from stressors as well as sharing this information with their caregivers to facilitate improved caregiver communication and decrease overall burden of care.  Issued pt handouts on healthy coping strategies to implement into routine.   Individual level documentation: Patient participated with full collaboration during session.   Pain: No pain reported during session  Ronal Mallie Needy 04/21/2023, 12:29 PM

## 2023-04-22 DIAGNOSIS — K5901 Slow transit constipation: Secondary | ICD-10-CM

## 2023-04-22 DIAGNOSIS — I63542 Cerebral infarction due to unspecified occlusion or stenosis of left cerebellar artery: Secondary | ICD-10-CM | POA: Diagnosis not present

## 2023-04-22 DIAGNOSIS — H814 Vertigo of central origin: Secondary | ICD-10-CM | POA: Diagnosis not present

## 2023-04-22 NOTE — Progress Notes (Addendum)
 Physical Therapy Session Note  Patient Details  Name: Brittney Yu MRN: [id number] Date of Birth: [birth year]  Today's Date: 04/21/2023 PT Individual Time: 1302-1345  PT Individual Time Calculation (min): 43 min  Short Term Goals: Week 1:  PT Short Term Goal 1 (Week 1): = to LTGs based on ELOS  Skilled Therapeutic Interventions/Progress Updates:  Patient seated in w/c on entrance to room. Patient alert and agreeable to PT session.   Patient with no pain complaint at start of session.  Therapeutic Activity: Transfers: Pt performed sit<>stand and stand pivot transfers throughout session with continued pause for self assessment of dizziness/ lightheadedness. No cues required for technique.   Neuromuscular Re-ed: NMR facilitated at start of session with focus on vestibular therex. Pt demonstrates all VOR x1 and VOR x2 exercises except for the exercise with 2 sticks as she cannot remember. No increase in symptoms noted. NMR performed for improvements in motor control and coordination, balance, sequencing, judgement, and self confidence/ efficacy in performing all aspects of mobility at highest level of independence.   Gait Training:  Pt ambulated community distances with use of rollator and distant supervision. Ambulation over variable surfaces as pt walks to front entrance, over thresholds, up/ down 80' ramp, don 2 flights of stairs, through parking garage, up driveway to hospital entrance. Seated rest break outside of front entrance. Discussed any further concerns re: return home. Also discussed again Neuro-based OPPT with Carly at [facility] and pt agreeable. Returns from front entrance to room with good foot clearance throughout. Pt aware. Provided vc for direction only.  Patient seated upright in w/c at end of session with brakes locked, belt alarm set, and all needs within reach.   Therapy Documentation Precautions:  Precautions Precautions: Fall, Other (comment) Precaution Comments:  diplopia, dizziness, R lateral lean Restrictions Weight Bearing Restrictions Per Provider Order: No  Pain:  No pain related this session.    Therapy/Group: Individual Therapy  Mliss DELENA Milliner PT, DPT, CSRS 04/21/2023, 5:53 PM

## 2023-04-22 NOTE — Progress Notes (Signed)
 PROGRESS NOTE   Subjective/Complaints:  Pt up in bed. No new complaints. Feels well  ROS: Patient denies fever, rash, sore throat, blurred vision, dizziness, nausea, vomiting, diarrhea, cough, shortness of breath or chest pain, joint or back/neck pain, headache, or mood change.   Objective:   No results found.  No results for input(s): WBC, HGB, HCT, PLT in the last 72 hours.  No results for input(s): NA, K, CL, CO2, GLUCOSE, BUN, CREATININE, CALCIUM  in the last 72 hours.   Intake/Output Summary (Last 24 hours) at 04/22/2023 0820 Last data filed at 04/22/2023 0750 Gross per 24 hour  Intake 714 ml  Output --  Net 714 ml        Physical Exam: Vital Signs Blood pressure (!) 110/41, pulse 68, temperature 97.8 F (36.6 C), temperature source Oral, resp. rate 18, height 5' 9.5 (1.765 m), weight 104.3 kg, last menstrual period 12/28/2016, SpO2 99%. Constitutional: No distress . Vital signs reviewed. HEENT: NCAT, EOMI, oral membranes moist Neck: supple Cardiovascular: RRR without murmur. No JVD    Respiratory/Chest: CTA Bilaterally without wheezes or rales. Normal effort    GI/Abdomen: BS +, non-tender, non-distended Ext: no clubbing, cyanosis, or edema Psych: pleasant and cooperative  Skin: No evidence of breakdown, no evidence of rash Neurologic: Cranial nerves II through XII grossly intact, motor strength is 5/5 in bilateral deltoid, bicep, tricep, grip, hip flexor, knee extensors, ankle dorsiflexor and plantar flexor Sensory exam pinprick & temp sensation reduce LUE and LE vs RUE and LE--exam stable 2/8 Musculoskeletal: Full range of motion in all 4 extremities. No joint swelling   Assessment/Plan: 1. Functional deficits which require 3+ hours per day of interdisciplinary therapy in a comprehensive inpatient rehab setting. Physiatrist is providing close team supervision and 24 hour management of  active medical problems listed below. Physiatrist and rehab team continue to assess barriers to discharge/monitor patient progress toward functional and medical goals  Care Tool:  Bathing    Body parts bathed by patient: Right arm, Left arm, Chest, Abdomen, Front perineal area, Buttocks, Right upper leg, Left upper leg, Right lower leg, Left lower leg, Face         Bathing assist Assist Level: Supervision/Verbal cueing     Upper Body Dressing/Undressing Upper body dressing   What is the patient wearing?: Pull over shirt    Upper body assist Assist Level: Set up assist    Lower Body Dressing/Undressing Lower body dressing      What is the patient wearing?: Underwear/pull up, Pants     Lower body assist Assist for lower body dressing: Supervision/Verbal cueing     Toileting Toileting Toileting Activity did not occur (Clothing management and hygiene only): N/A (no void or bm)  Toileting assist Assist for toileting: Minimal Assistance - Patient > 75%     Transfers Chair/bed transfer  Transfers assist     Chair/bed transfer assist level: Minimal Assistance - Patient > 75%     Locomotion Ambulation   Ambulation assist      Assist level: Minimal Assistance - Patient > 75% Assistive device: No Device Max distance: 234ft   Walk 10 feet activity   Assist  Assist level: Minimal Assistance - Patient > 75% Assistive device: No Device   Walk 50 feet activity   Assist    Assist level: Minimal Assistance - Patient > 75% Assistive device: No Device    Walk 150 feet activity   Assist    Assist level: Minimal Assistance - Patient > 75% Assistive device: No Device    Walk 10 feet on uneven surface  activity   Assist     Assist level: Minimal Assistance - Patient > 75%     Wheelchair     Assist Is the patient using a wheelchair?: No             Wheelchair 50 feet with 2 turns activity    Assist            Wheelchair  150 feet activity     Assist          Blood pressure (!) 110/41, pulse 68, temperature 97.8 F (36.6 C), temperature source Oral, resp. rate 18, height 5' 9.5 (1.765 m), weight 104.3 kg, last menstrual period 12/28/2016, SpO2 99%.  Medical Problem List and Plan: 1. 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  Antithrombotics: -DVT/anticoagulation:  Pharmaceutical: Lovenox              -antiplatelet therapy: Aspirin  and Plavix  for three weeks followed by aspirin  alone   3. Pain Management: Tylenol  as needed   4. Mood/Behavior/Sleep: LCSW to evaluate and provide emotional support             -antipsychotic agents: n/a   5. Neuropsych/cognition: This patient is capable of making decisions on her own behalf.   6. Skin/Wound Care: Routine skin care checks   7. 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 Added PRN  meclizine  25 mg QID- not taking will d/c   12.  Loose stool?  -no bm since 2/6--obsv for bm this weekend.    -has prn miralax  and dulcolax supp    LOS: 8 days A FACE TO FACE EVALUATION WAS PERFORMED  Brittney Yu 04/22/2023, 8:20 AM

## 2023-04-22 NOTE — Progress Notes (Signed)
 Physical Therapy Session Note  Patient Details  Name: Brittney Yu MRN: [id number] Date of Birth: 11-17-1965  Today's Date: 04/21/2023 PT Individual Time: 1132-1202  PT Individual Time Calculation (min): 30 min  Short Term Goals: Week 1:  PT Short Term Goal 1 (Week 1): = to LTGs based on ELOS   Skilled Therapeutic Interventions/Progress Updates:  Patient seated upright in ortho gym following group session. Patient alert and agreeable to PT session.   Patient with no pain complaint at start of session.  Therapeutic Activity: Transfers: Pt performed sit<>stand and stand pivot transfers throughout session with supervision/ Mod I. Demos continued good self-assessment of vestibular symptoms.  Gait Training/ NMR:  Pt continues to demo low step height with sliding of feet this day. Guided pt in ambulation with 3#aw donned to/ from day room with no AD and supervision. Progressed to hold of 2# ball in BUE and guided in AP chest press, then guided in scapular depression and retraction while passing ball from hand to hand. After seated rest break, increased to 6# weighted ball and same exercise then progressed to chest press alternating to overhead press during ambulation. Performed again with aw doffed. Increased step height noted with no sliding of feet on ground.   NMR performed for improvements in motor control and coordination, balance, sequencing, judgement, and self confidence/ efficacy in performing all aspects of mobility at highest level of independence.   Patient seated upright in room at end of session with brakes locked, belt alarm set, and all needs within reach.  Therapy Documentation Precautions:  Precautions Precautions: Fall, Other (comment) Precaution Comments: diplopia, dizziness, R lateral lean Restrictions Weight Bearing Restrictions Per Provider Order: No  Pain:  No pain related this session.   Therapy/Group: Individual Therapy  Mliss DELENA Milliner PT, DPT,  CSRS 04/21/2023, 5:36 PM

## 2023-04-22 NOTE — Progress Notes (Signed)
 Occupational Therapy Weekly Progress Note  Patient Details  Name: Brittney Yu MRN: [id number] Date of Birth: 21-Nov-1965  Beginning of progress report period: April 15, 2023 End of progress report period: April 22, 2023  Today's Date: 04/22/2023 OT Individual Time: 1000-1045 OT Individual Time Calculation (min): 45 min    Patient has met 0 of 1 short term goals d/t LTGs not written based on Pt's ELOS. Pt is steadily progressing towards her LTGs with her LOS extended through Tuesday, 02/11 to allow for time to increased Pt's independence to mod I level to increase safety, prevent falls, and to facilitate increased independence in her caregiver role. Pt is completing all BADLs and functional transfers with supervision using rollator intermittently.   Patient continues to demonstrate the following deficits: muscle weakness, decreased cardiorespiratoy endurance, decreased coordination and decreased motor planning, decreased visual perceptual skills and decreased visual motor skills, central origin, and decreased standing balance and decreased balance strategies and therefore will continue to benefit from skilled OT intervention to enhance overall performance with BADL, iADL, and Reduce care partner burden.  Patient progressing toward long term goals..  Continue plan of care.  OT Short Term Goals Week 1:  OT Short Term Goal 1 (Week 1): STG=LTG due to LOS OT Short Term Goal 1 - Progress (Week 1): Progressing toward goal Week 2:  OT Short Term Goal 1 (Week 2): STG=LTG d/t ELOS  Skilled Therapeutic Interventions/Progress Updates:     Pt received sitting up in wc presenting to be in good spirits receptive to skilled OT session reporting 0/10 pain- OT offering intermittent rest breaks, repositioning, and therapeutic support to optimize participation in therapy session. Pt requesting to take shower and complete morning routine during session.   BADLs:   -Toileting: Pt completed ambulatory  transfer to toilet without AD mod I and completed 3/3 toileting steps no AD mod I.  -Bathing: Pt completed U/LB bathing seated on TTB with distance supervision, no LOB or dizziness reported.  -Dressing: U/LB bathing completed seated on elevated toilet seat mod I without LOB and standing to bring pants to waist. Pt with good safety awareness during task and implementing energy conservation techniques, independently providing self increased time and sitting for dressing tasks.   Functional Mobility:  -Pt completed functional mobility throughout session mod I without AD mod It. Engaged Pt in completing functional mobility through busy hospital hallway >200 ft without AD while visually scanning from R <> L with Pt reporting mild dizziness when turning head to the right, however symptoms resolve when taking a standing rest break.   Education:  -Pt inquiring about heart healthy diet to support lower cholesterol levels and anxiety management during therapy session. Provided education on food options and issued Pt handouts on heart healthy diety, cholesterol management, foods for anxiety management, and foods for weight loss with Pt receptive to education and motivated to implement changes. Also recommended Pt follow-up with PCP and nutritionist to support nutrition and health goals.   Pt was left resting in wc with call bell in reach, seatbelt alarm on, and all needs met.    Therapy Documentation Precautions:  Precautions Precautions: Fall, Other (comment) Precaution Comments: diplopia, dizziness, R lateral lean Restrictions Weight Bearing Restrictions Per Provider Order: No   Therapy/Group: Individual Therapy  Katheryn SHAUNNA Mines 04/22/2023, 8:01 AM

## 2023-04-22 NOTE — Progress Notes (Signed)
 Physical Therapy Session Note  Patient Details  Name: Brittney Yu MRN: [id number] Date of Birth: [birth year]  Today's Date: 04/22/2023 PT Individual Time: 0801-0916 PT Individual Time Calculation (min): 75 min   Short Term Goals: Week 1:  PT Short Term Goal 1 (Week 1): = to LTGs based on ELOS  Skilled Therapeutic Interventions/Progress Updates:  Pt was seen bedside in the am. Pt performed VOR x 1 and VOR x 2 exercises. No increased dizziness. Pt performed all sit to stand transfers with S and stand pivot transfer with contact guard to S. Pt ambulated 150 feet x 2 without assistive device and contact guard and verbal cues. In gym treatment focused on NMR. Pt performed cone taps and alternating cone taps, 3 set x 10 reps each. Pt ambulated slalom course 120 feet x 3, side step 120 feet x 3 and backwards 60 feet x 3. Pt had one episode of dizziness with walking backwards. Pt ambulated back to room and left sitting up in w/c with all needs within reach.   Therapy Documentation Precautions:  Precautions Precautions: Fall, Other (comment) Precaution Comments: diplopia, dizziness, R lateral lean Restrictions Weight Bearing Restrictions Per Provider Order: No General:   Pain: Pain Assessment Pain Scale: 0-10 Pain Score: 0-No pain  Therapy/Group: Individual Therapy  Santhosh Gulino G 04/22/2023, 12:12 PM

## 2023-04-23 DIAGNOSIS — I63542 Cerebral infarction due to unspecified occlusion or stenosis of left cerebellar artery: Secondary | ICD-10-CM | POA: Diagnosis not present

## 2023-04-23 DIAGNOSIS — H814 Vertigo of central origin: Secondary | ICD-10-CM | POA: Diagnosis not present

## 2023-04-23 DIAGNOSIS — K5901 Slow transit constipation: Secondary | ICD-10-CM | POA: Diagnosis not present

## 2023-04-23 NOTE — Progress Notes (Signed)
 PROGRESS NOTE   Subjective/Complaints:  Pt slept well. No complaints today. Smiling and in good spirits  ROS: Patient denies fever, rash, sore throat, blurred vision, dizziness, nausea, vomiting, diarrhea, cough, shortness of breath or chest pain, joint or back/neck pain, headache, or mood change.   Objective:   No results found.  No results for input(s): WBC, HGB, HCT, PLT in the last 72 hours.  No results for input(s): NA, K, CL, CO2, GLUCOSE, BUN, CREATININE, CALCIUM  in the last 72 hours.   Intake/Output Summary (Last 24 hours) at 04/23/2023 0812 Last data filed at 04/22/2023 1840 Gross per 24 hour  Intake 600 ml  Output --  Net 600 ml        Physical Exam: Vital Signs Blood pressure (!) 101/45, pulse 65, temperature 98.1 F (36.7 C), temperature source Oral, resp. rate 18, height 5' 9.5 (1.765 m), weight 104.3 kg, last menstrual period 12/28/2016, SpO2 99%. Constitutional: No distress . Vital signs reviewed. HEENT: NCAT, EOMI, oral membranes moist Neck: supple Cardiovascular: RRR without murmur. No JVD    Respiratory/Chest: CTA Bilaterally without wheezes or rales. Normal effort    GI/Abdomen: BS +, non-tender, non-distended Ext: no clubbing, cyanosis, or edema Psych: pleasant and cooperative   Skin: No evidence of breakdown, no evidence of rash Neurologic: Cranial nerves II through XII grossly intact, motor strength is 5/5 in bilateral deltoid, bicep, tricep, grip, hip flexor, knee extensors, ankle dorsiflexor and plantar flexor Sensory exam pinprick & temp sensation reduce LUE and LE vs RUE and LE--exam stable 2/9 Musculoskeletal: Full range of motion in all 4 extremities. No joint swelling   Assessment/Plan: 1. Functional deficits which require 3+ hours per day of interdisciplinary therapy in a comprehensive inpatient rehab setting. Physiatrist is providing close team supervision and  24 hour management of active medical problems listed below. Physiatrist and rehab team continue to assess barriers to discharge/monitor patient progress toward functional and medical goals  Care Tool:  Bathing    Body parts bathed by patient: Right arm, Left arm, Chest, Abdomen, Front perineal area, Buttocks, Right upper leg, Left upper leg, Right lower leg, Left lower leg, Face         Bathing assist Assist Level: Supervision/Verbal cueing     Upper Body Dressing/Undressing Upper body dressing   What is the patient wearing?: Pull over shirt    Upper body assist Assist Level: Set up assist    Lower Body Dressing/Undressing Lower body dressing      What is the patient wearing?: Underwear/pull up, Pants     Lower body assist Assist for lower body dressing: Supervision/Verbal cueing     Toileting Toileting Toileting Activity did not occur (Clothing management and hygiene only): N/A (no void or bm)  Toileting assist Assist for toileting: Minimal Assistance - Patient > 75%     Transfers Chair/bed transfer  Transfers assist     Chair/bed transfer assist level: Contact Guard/Touching assist     Locomotion Ambulation   Ambulation assist      Assist level: Contact Guard/Touching assist Assistive device: No Device Max distance: 150   Walk 10 feet activity   Assist     Assist level: Contact  Guard/Touching assist Assistive device: No Device   Walk 50 feet activity   Assist    Assist level: Contact Guard/Touching assist Assistive device: No Device    Walk 150 feet activity   Assist    Assist level: Contact Guard/Touching assist Assistive device: No Device    Walk 10 feet on uneven surface  activity   Assist     Assist level: Minimal Assistance - Patient > 75%     Wheelchair     Assist Is the patient using a wheelchair?: No             Wheelchair 50 feet with 2 turns activity    Assist            Wheelchair 150  feet activity     Assist          Blood pressure (!) 101/45, pulse 65, temperature 98.1 F (36.7 C), temperature source Oral, resp. rate 18, height 5' 9.5 (1.765 m), weight 104.3 kg, last menstrual period 12/28/2016, SpO2 99%.  Medical Problem List and Plan: 1. 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  Antithrombotics: -DVT/anticoagulation:  Pharmaceutical: Lovenox              -antiplatelet therapy: Aspirin  and Plavix  for three weeks followed by aspirin  alone   3. Pain Management: Tylenol  as needed   4. Mood/Behavior/Sleep: LCSW to evaluate and provide emotional support             -antipsychotic agents: n/a   5. Neuropsych/cognition: This patient is capable of making decisions on her own behalf.   6. Skin/Wound Care: Routine skin care checks   7. 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 Added PRN  meclizine  25 mg QID- not taking will d/c   12.  Loose stool appears resolved.   -had type 5 stool 2/8   -has prn miralax  and dulcolax supp    LOS: 9 days A FACE TO FACE EVALUATION WAS PERFORMED  Arthea ONEIDA Gunther 04/23/2023, 8:12 AM

## 2023-04-24 ENCOUNTER — Other Ambulatory Visit (HOSPITAL_COMMUNITY): Payer: Self-pay

## 2023-04-24 DIAGNOSIS — F54 Psychological and behavioral factors associated with disorders or diseases classified elsewhere: Secondary | ICD-10-CM | POA: Diagnosis not present

## 2023-04-24 DIAGNOSIS — I63542 Cerebral infarction due to unspecified occlusion or stenosis of left cerebellar artery: Secondary | ICD-10-CM | POA: Diagnosis not present

## 2023-04-24 DIAGNOSIS — H814 Vertigo of central origin: Secondary | ICD-10-CM | POA: Diagnosis not present

## 2023-04-24 DIAGNOSIS — R278 Other lack of coordination: Secondary | ICD-10-CM | POA: Diagnosis not present

## 2023-04-24 DIAGNOSIS — I63019 Cerebral infarction due to thrombosis of unspecified vertebral artery: Secondary | ICD-10-CM

## 2023-04-24 LAB — BASIC METABOLIC PANEL
Anion gap: 16 — ABNORMAL HIGH (ref 5–15)
BUN: 18 mg/dL (ref 6–20)
CO2: 27 mmol/L (ref 22–32)
Calcium: 10.3 mg/dL (ref 8.9–10.3)
Chloride: 98 mmol/L (ref 98–111)
Creatinine, Ser: 1.03 mg/dL — ABNORMAL HIGH (ref 0.44–1.00)
GFR, Estimated: >60 mL/min (ref >60)
Glucose, Bld: 92 mg/dL (ref 70–99)
Potassium: 4.3 mmol/L (ref 3.5–5.1)
Sodium: 141 mmol/L (ref 135–145)

## 2023-04-24 LAB — CBC
HCT: 43.1 % (ref 36.0–46.0)
Hemoglobin: 13.8 g/dL (ref 12.0–15.0)
MCH: 27.5 pg (ref 26.0–34.0)
MCHC: 32 g/dL (ref 30.0–36.0)
MCV: 85.9 fL (ref 80.0–100.0)
Platelets: 309 10*3/uL (ref 150–400)
RBC: 5.02 MIL/uL (ref 3.87–5.11)
RDW: 14.5 % (ref 11.5–15.5)
WBC: 4.1 10*3/uL (ref 4.0–10.5)
nRBC: 0 % (ref 0.0–0.2)

## 2023-04-24 MED ORDER — CLOPIDOGREL BISULFATE 75 MG PO TABS
75.0000 mg | ORAL_TABLET | Freq: Every day | ORAL | 0 refills | Status: AC
  Filled 2023-04-24: qty 8, 8d supply, fill #0

## 2023-04-24 MED ORDER — ASPIRIN 81 MG PO TBEC
81.0000 mg | DELAYED_RELEASE_TABLET | Freq: Every day | ORAL | 0 refills | Status: DC
  Filled 2023-04-24: qty 30, 30d supply, fill #0

## 2023-04-24 MED ORDER — ACETAMINOPHEN 325 MG PO TABS: 325.0000 mg | ORAL_TABLET | ORAL | Status: DC | PRN

## 2023-04-24 MED ORDER — ATORVASTATIN CALCIUM 80 MG PO TABS
80.0000 mg | ORAL_TABLET | Freq: Every day | ORAL | 0 refills | Status: DC
  Filled 2023-04-24: qty 30, 30d supply, fill #0

## 2023-04-24 NOTE — Progress Notes (Signed)
 PROGRESS NOTE   Subjective/Complaints:  No issues overnite   ROS: Patient denies CP, SOB, N/V/D  Objective:   No results found.  Recent Labs    04/24/23 0511  WBC 4.1  HGB 13.8  HCT 43.1  PLT 309    Recent Labs    04/24/23 0511  NA 141  K 4.3  CL 98  CO2 27  GLUCOSE 92  BUN 18  CREATININE 1.03*  CALCIUM  10.3     Intake/Output Summary (Last 24 hours) at 04/24/2023 0810 Last data filed at 04/24/2023 0726 Gross per 24 hour  Intake 1431 ml  Output --  Net 1431 ml        Physical Exam: Vital Signs Blood pressure (!) 120/50, pulse 62, temperature 97.7 F (36.5 C), temperature source Oral, resp. rate 18, height 5' 9.5" (1.765 m), weight 104.3 kg, last menstrual period 12/28/2016, SpO2 97%.  General: No acute distress Mood and affect are appropriate Heart: Regular rate and rhythm no rubs murmurs or extra sounds Lungs: Clear to auscultation, breathing unlabored, no rales or wheezes Abdomen: Positive bowel sounds, soft nontender to palpation, nondistended Extremities: No clubbing, cyanosis, or edema Skin: No evidence of breakdown, no evidence of rash  Skin: No evidence of breakdown, no evidence of rash Neurologic: Cranial nerves II through XII grossly intact, motor strength is 5/5 in bilateral deltoid, bicep, tricep, grip, hip flexor, knee extensors, ankle dorsiflexor and plantar flexor Romberg neg  Musculoskeletal: Full range of motion in all 4 extremities. No joint swelling   Assessment/Plan: 1. Functional deficits which require 3+ hours per day of interdisciplinary therapy in a comprehensive inpatient rehab setting. Physiatrist is providing close team supervision and 24 hour management of active medical problems listed below. Physiatrist and rehab team continue to assess barriers to discharge/monitor patient progress toward functional and medical goals  Care Tool:  Bathing    Body parts bathed by  patient: Right arm, Left arm, Chest, Abdomen, Front perineal area, Buttocks, Right upper leg, Left upper leg, Right lower leg, Left lower leg, Face         Bathing assist Assist Level: Supervision/Verbal cueing     Upper Body Dressing/Undressing Upper body dressing   What is the patient wearing?: Pull over shirt    Upper body assist Assist Level: Set up assist    Lower Body Dressing/Undressing Lower body dressing      What is the patient wearing?: Underwear/pull up, Pants     Lower body assist Assist for lower body dressing: Supervision/Verbal cueing     Toileting Toileting Toileting Activity did not occur (Clothing management and hygiene only): N/A (no void or bm)  Toileting assist Assist for toileting: Minimal Assistance - Patient > 75%     Transfers Chair/bed transfer  Transfers assist     Chair/bed transfer assist level: Contact Guard/Touching assist     Locomotion Ambulation   Ambulation assist      Assist level: Contact Guard/Touching assist Assistive device: No Device Max distance: 150   Walk 10 feet activity   Assist     Assist level: Contact Guard/Touching assist Assistive device: No Device   Walk 50 feet activity   Assist  Assist level: Contact Guard/Touching assist Assistive device: No Device    Walk 150 feet activity   Assist    Assist level: Contact Guard/Touching assist Assistive device: No Device    Walk 10 feet on uneven surface  activity   Assist     Assist level: Minimal Assistance - Patient > 75%     Wheelchair     Assist Is the patient using a wheelchair?: No             Wheelchair 50 feet with 2 turns activity    Assist            Wheelchair 150 feet activity     Assist          Blood pressure (!) 120/50, pulse 62, temperature 97.7 F (36.5 C), temperature source Oral, resp. rate 18, height 5' 9.5" (1.765 m), weight 104.3 kg, last menstrual period 12/28/2016, SpO2  97%.  Medical Problem List and Plan: 1. 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  Antithrombotics: -DVT/anticoagulation:  Pharmaceutical: Lovenox              -antiplatelet therapy: Aspirin  and Plavix  for three weeks followed by aspirin  alone   3. Pain Management: Tylenol  as needed   4. Mood/Behavior/Sleep: LCSW to evaluate and provide emotional support             -antipsychotic agents: n/a   5. Neuropsych/cognition: This patient is capable of making decisions on her own behalf.   6. Skin/Wound Care: Routine skin care checks   7. 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 Added PRN  meclizine  25 mg QID- not taking will d/c   12.  Loose stool appears resolved.   -had type 5 stool 2/8   -has prn miralax  and dulcolax supp    LOS: 10 days A FACE TO FACE EVALUATION WAS PERFORMED  Genetta Kenning 04/24/2023, 8:10 AM

## 2023-04-24 NOTE — Progress Notes (Signed)
Physical Therapy Discharge Summary  Patient Details  Name: Brittney Yu MRN: [id number] Date of Birth: 03/05/1966  Date of Discharge from PT service:April 24, 2023  Today's Date: 04/24/2023 PT Individual Time: 1302-1412 PT Individual Time Calculation (min): 70 min    Patient has met 9 of 9 long term goals due to improved activity tolerance, improved balance, increased strength, functional use of  right upper extremity and right lower extremity, improved attention, improved awareness, and improved coordination.  Patient to discharge at an ambulatory level Modified Independent.   Patient's care partner is independent to provide the necessary physical assistance at discharge.  Reasons goals not met: n/a  Recommendation:  Patient will benefit from ongoing skilled PT services in neuro outpatient setting to continue to advance safe functional mobility, address ongoing impairments in strength, coordination, balance, activity tolerance, cognition, safety awareness, and to minimize fall risk.  Equipment: Rollator recommended but not provided as not available through insurance for her height. Pt provided with printed materials to find appropriate taller height rollator for times requiring further distance and/ or times when pt's balance/ lightheadedness/ dizziness is affecting safe mobility.   Reasons for discharge: treatment goals met  Patient/family agrees with progress made and goals achieved: Yes  PT Discharge Precautions/Restrictions Precautions Precautions: Fall;Other (comment) Precaution Comments: Ocassional dizziness when moving quickly Restrictions Weight Bearing Restrictions Per Provider Order: No  Pain Pain Assessment Pain Scale: 0-10 Pain Score: 0-No pain Pain Interference Pain Interference Pain Effect on Sleep: 0. Does not apply - I have not had any pain or hurting in the past 5 days Pain Interference with Therapy Activities: 0. Does not apply - I have not received  rehabilitationtherapy in the past 5 days Pain Interference with Day-to-Day Activities: 1. Rarely or not at all Vision/Perception  Vision - History Ability to See in Adequate Light: 0 Adequate Perception Perception: Impaired Preception Impairment Details: Spatial orientation Perception-Other Comments: Mild spatial orientation deficit with significant improvement since inital eval Praxis Praxis: WFL  Cognition Overall Cognitive Status: Within Functional Limits for tasks assessed Arousal/Alertness: Awake/alert Orientation Level: Oriented X4 Sensation Sensation Light Touch: Impaired Detail Central sensation comments: reports L LE feels "numb" compared to R LE Light Touch Impaired Details: Impaired LLE;Impaired LUE Coordination Gross Motor Movements are Fluid and Coordinated: No Fine Motor Movements are Fluid and Coordinated: Yes Coordination and Movement Description: signficant improvement from inital eval, occasional R lateral lean bias Heel Shin Test: [facility] Motor  Motor Motor: Abnormal postural alignment and control Motor - Discharge Observations: Signficant improvement from inital eval, mild R lean bias when walking if fatigued  Mobility Bed Mobility Bed Mobility: Supine to Sit;Sit to Supine Rolling Right: Independent Right Sidelying to Sit: Independent Supine to Sit: Independent;Independent with assistive device Sit to Supine: Independent Transfers Transfers: Sit to Stand;Stand to Sit;Stand Pivot Transfers Sit to Stand: Independent;Independent with assistive device Stand to Sit: Independent with assistive device Stand Pivot Transfers: Independent;Independent with assistive device Stand Pivot Transfer Details: Verbal cues for precautions/safety Stand Pivot Transfer Details (indicate cue type and reason): vc for eye turning prior to head/ bodily turning to reduce dizziness/ lightheadedness Transfer (Assistive device): None Locomotion  Gait Ambulation: Yes Gait Assistance:  Independent with assistive device (no AD but requires extra time with increased focus to maintain balance and reduce potential dizziness) Gait Distance (Feet): 1000 Feet Assistive device: None Gait Assistance Details: Verbal cues for precautions/safety Gait Assistance Details: prefers to use facility bari rollator; unable to provide through hospital provider d/t weight less than required  for bari rollator despite need d/t height. Gait Gait: Yes Gait Pattern: Within Functional Limits Gait Pattern:  (intermittent mild R lean) Gait velocity: self-chooses to ambulate with decreased pace, but able to increase speed when cued with no path deviations. Stairs / Additional Locomotion Stairs: Yes Stairs Assistance: Independent with assistive device Stair Management Technique: Alternating pattern;Forwards;One rail Left Number of Stairs: 12 Height of Stairs: 6 Ramp: Independent Curb: Independent with assistive device (hand on knee) Pick up small object from the floor assist level: Supervision/Verbal cueing Wheelchair Mobility Wheelchair Mobility: No  Trunk/Postural Assessment  Cervical Assessment Cervical Assessment: Within Functional Limits Thoracic Assessment Thoracic Assessment: Within Functional Limits Lumbar Assessment Lumbar Assessment: Within Functional Limits Postural Control Righting Reactions: mild delay, improvement from inital eval Protective Responses: mild delay, improvement from inital eval, improved stepping strategy  Balance Balance Balance Assessed: Yes Standardized Balance Assessment Standardized Balance Assessment: Berg Balance Test;Functional Gait Assessment Berg Balance Test Sit to Stand: Able to stand without using hands and stabilize independently Standing Unsupported: Able to stand safely 2 minutes Sitting with Back Unsupported but Feet Supported on Floor or Stool: Able to sit safely and securely 2 minutes Stand to Sit: Sits safely with minimal use of  hands Transfers: Able to transfer safely, minor use of hands Standing Unsupported with Eyes Closed: Able to stand 10 seconds safely Standing Ubsupported with Feet Together: Able to place feet together independently and stand 1 minute safely From Standing, Reach Forward with Outstretched Arm: Can reach confidently >25 cm (10") From Standing Position, Pick up Object from Floor: Able to pick up shoe, needs supervision From Standing Position, Turn to Look Behind Over each Shoulder: Looks behind from both sides and weight shifts well Turn 360 Degrees: Able to turn 360 degrees safely but slowly Standing Unsupported, Alternately Place Feet on Step/Stool: Able to stand independently and safely and complete 8 steps in 20 seconds Standing Unsupported, One Foot in Front: Able to plae foot ahead of the other independently and hold 30 seconds Standing on One Leg: Able to lift leg independently and hold 5-10 seconds Total Score: 51 Static Sitting Balance Static Sitting - Balance Support: No upper extremity supported;Feet supported Static Sitting - Level of Assistance: 7: Independent Dynamic Sitting Balance Dynamic Sitting - Balance Support: No upper extremity supported;Feet supported;During functional activity Dynamic Sitting - Level of Assistance: 7: Independent Static Standing Balance Static Standing - Balance Support: During functional activity;No upper extremity supported Static Standing - Level of Assistance: 6: Modified independent (Device/Increase time) Dynamic Standing Balance Dynamic Standing - Balance Support: During functional activity;No upper extremity supported Dynamic Standing - Level of Assistance: 6: Modified independent (Device/Increase time) Functional Gait  Assessment Gait assessed : Yes Gait Level Surface: Walks 20 ft in less than 5.5 sec, no assistive devices, good speed, no evidence for imbalance, normal gait pattern, deviates no more than 6 in outside of the 12 in walkway  width. Change in Gait Speed: Able to smoothly change walking speed without loss of balance or gait deviation. Deviate no more than 6 in outside of the 12 in walkway width. Gait with Horizontal Head Turns: Performs head turns smoothly with no change in gait. Deviates no more than 6 in outside 12 in walkway width Gait with Vertical Head Turns: Performs head turns with no change in gait. Deviates no more than 6 in outside 12 in walkway width. Gait and Pivot Turn: Pivot turns safely in greater than 3 sec and stops with no loss of balance, or pivot turns safely  within 3 sec and stops with mild imbalance, requires small steps to catch balance. Step Over Obstacle: Is able to step over one shoe box (4.5 in total height) without changing gait speed. No evidence of imbalance. Gait with Narrow Base of Support: Ambulates 7-9 steps. Gait with Eyes Closed: Walks 20 ft, no assistive devices, good speed, no evidence of imbalance, normal gait pattern, deviates no more than 6 in outside 12 in walkway width. Ambulates 20 ft in less than 7 sec. Ambulating Backwards: Walks 20 ft, no assistive devices, good speed, no evidence for imbalance, normal gait Steps: Alternating feet, no rail. Total Score: 27 FGA comment:: Low Fall Risk Extremity Assessment      RLE Assessment RLE Assessment: Within Functional Limits LLE Assessment LLE Assessment: Within Functional Limits  Skilled Intervention: PT instructed pt in Grad day assessment to measure progress toward goals. See below for details. CARETool mobility assessment  also completed; see CAREtool tab in navigator for details.  Pt has made significant improvement overall and especially with balance/ dizziness reported following initiation of VOR therex and habituation exercises. Will definitely benefit from continued focus on balance/ dizziness with f/u in OPPT and continued vestibular training of central origin. Recommended to f/u with Casimiro Needle PT at [facility] outpatient  clinic. Is overall IND but requires extra time and slower movements for improved safety in mobility. All mobility updated with re-assessment of FGA and Berg Balance Test as listed above.    Loel Dubonnet PT, DPT, CSRS 04/24/2023, 6:00 PM

## 2023-04-24 NOTE — Consult Note (Signed)
 Neuropsychological Consultation Comprehensive Inpatient Rehab   Patient:   Brittney Yu   DOB:   10/16/1965  MR Number:  [id number]  Location:  [facility] [address]ENTER [address] [place] [address] Dept: [phone] Loc: [phone]           Date of Service:   04/24/2023  Start Time:   10 AM End Time:   11 AM  Provider/Observer:  Chapman Commodore, Psy.D.       Clinical Neuropsychologist       Billing Code/Service: [phone]  Reason for Service:    Brittney Yu is a 58 year old female referred for neuropsychological consultation during her ongoing admission onto the comprehensive inpatient rehabilitation unit.  The patient had a recent cerebrovascular event primarily impacting motor function including production of speech.  Patient also had stroke impacting her cerebellum but very difficult to fully assess that with ongoing motor deficits due to medulla involvement.  Patient arrived via EMS to [facility] emergency department on 04/12/2023 reporting sudden onset of weakness, dizziness, right-sided headache and emesis.  Patient was hemodynamically stable and labs were unremarkable.  CT head showed no evidence of acute process but MRI of brain identified small acute infarct in the right lateral medulla and right cerebellum.  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  Patient was admitted onto CIR due to ongoing motor deficits secondary to acute infarcts in the right lateral medulla and right cerebellum.  During today's clinical visit the patient was awake and alert with positive affect.  Patient clearly having significant expressive language deficits.  At times the patient was able to whisper with more accuracy and fluency in her speech.  However, anytime she tried to speak at a normal volume she was unable to express herself much at all.  The patient also showed left-sided weakness.  However,  she was able to demonstrate orientation x 4.  Patient reports that she was stressed by lingering deficits and particularly concerned about her language and ability to stand.  We worked on coping issues around long hospital stay and very slow recovery at this point.  Patient was aware of improvements that she has made over the past week and indicated that she remained motivated to continue to work on therapeutic interventions.  Her expressive language deficits appeared to be almost exclusively motor deficits and the patient showed indication of good receptive language and a full awareness of what she was attempting to say and when she made adjustments she was able to express herself in an understandable manner.  HPI for the current admission:   HPI: Brittney Yu is a 58 year old female who arrive via EMS to [facility] ED on 04/12/2023 complaining of sudden onset of weakness, dizziness, right-sided headache and emesis x 1. She was hemodynamically stable and labs were unremarkable. She has no significant medical history. 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 Also showed abnormal right vertebral flow void and recommended CTA of head and neck. Admitted to hospitalist service and neurology consulted. 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 She was given Plavix  300 mg load and is now on daily aspirin  81 mg and Plavix  75 mg. 2D echo with EF of 60 to 65%. Hemoglobin A1c 5.7%. 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 No evidence of embolic source. She was started on atorvastatin  80 mg nightly. Required min assist for mobility. Right lateral lean noted. The patient requires inpatient medicine and rehabilitation evaluations and services for ongoing dysfunction secondary to acute infarcts in the right lateral medulla and  right cerebellum.    Medical History:  History reviewed. No pertinent past medical history.       Patient Active Problem List   Diagnosis Date Noted   Coping style affecting medical condition 04/24/2023   CVA (cerebral vascular accident) (HCC) 04/14/2023   Acute CVA (cerebrovascular accident) (HCC) 04/12/2023   Rotator cuff impingement syndrome, right 10/12/2022    Behavioral Observation/Mental Status:   Brittney Yu  presents as a 58 y.o.-year-old Right handed African American Female who appeared her stated age. her dress was Appropriate and she was Well Groomed and her manners were Appropriate to the situation.  her participation was indicative of Appropriate and Attentive behaviors.  There were physical disabilities noted.  she displayed an appropriate level of cooperation and motivation.    Interactions:    Active Appropriate  Attention:   within normal limits and attention span and concentration were age appropriate  Memory:   within normal limits; recent and remote memory intact  Visuo-spatial:   not examined  Speech (Volume):  low  Speech:   non-fluent aphasia; slurred  Thought Process:  Coherent and Relevant  Barely audible  and Linear  Though Content:  WNL; not suicidal and not homicidal  Orientation:   person, place, and time/date  Judgment:   Fair  Planning:   Fair  Affect:    Appropriate  Mood:    Anxious  Insight:   Good  Intelligence:   normal  Psychiatric History:  No prior psychiatric history  Family Med/Psych History:  Family History  Problem Relation Age of Onset   Hypertension Mother    Breast cancer Father    Impression/DX:   Brittney Yu is a 58 year old female referred for neuropsychological consultation during her ongoing admission onto the comprehensive inpatient rehabilitation unit.  The patient had a recent cerebrovascular event primarily impacting motor function including production of speech.  Patient also had stroke impacting  her cerebellum but very difficult to fully assess that with ongoing motor deficits due to medulla involvement.  Patient arrived via EMS to [facility] emergency department on 04/12/2023 reporting sudden onset of weakness, dizziness, right-sided headache and emesis.  Patient was hemodynamically stable and labs were unremarkable.  CT head showed no evidence of acute process but MRI of brain identified small acute infarct in the right lateral medulla and right cerebellum.  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  Patient was admitted onto CIR due to ongoing motor deficits secondary to acute infarcts in the right lateral medulla and right cerebellum.  During today's clinical visit the patient was awake and alert with positive affect.  Patient clearly having significant expressive language deficits.  At times the patient was able to whisper with more accuracy and fluency in her speech.  However, anytime she tried to speak at a normal volume she was unable to express herself much at all.  The patient also showed left-sided weakness.  However, she was able to demonstrate orientation x 4.  Patient reports that she was stressed by lingering deficits and particularly concerned about her language and ability to stand.  We worked on coping issues around long hospital stay and very slow recovery at this point.  Patient was  aware of improvements that she has made over the past week and indicated that she remained motivated to continue to work on therapeutic interventions.  Her expressive language deficits appeared to be almost exclusively motor deficits and the patient showed indication of good receptive language and a full awareness of what she was attempting to say and when she made adjustments she was able to express herself in an understandable manner.  Disposition/Plan:  Today we worked on coping and adjustment issues.  The patient appeared to have good receptive language abilities and  adequate cognition with understanding of what she intended to say and expressed that she had full knowledge of the words that she wanted to say but motor deficits were the culprit for her nonfluent aphasic symptoms.           Electronically Signed   _______________________ Chapman Commodore, Psy.D. Clinical Neuropsychologist

## 2023-04-24 NOTE — Progress Notes (Signed)
Inpatient Rehabilitation Care Coordinator Discharge Note   Patient Details  Name: Brittney Yu MRN: [id number] Date of Birth: 12-04-1965   Discharge location: HOME WITH DAUGHTER AND DISABLED SON WHOM SHE CARED FOR PRIOR TO ADMISSION  Length of Stay: 11 days  Discharge activity level: MOD/I LEVEL  Home/community participation: ACTIVE  Patient response HK:[id number] Literacy - How often do you need to have someone help you when you read instructions, pamphlets, or other written material from your doctor or pharmacy?: Never  Patient response GL:OVFIEP Isolation - How often do you feel lonely or isolated from those around you?: Never  Services provided included: MD, RD, OT, PT, SLP, RN, CM, Pharmacy, SW  Financial Services:  Financial Services Utilized: Medicaid    Choices offered to/list presented to: PT  Follow-up services arranged:  Outpatient, DME, Patient/Family has no preference for HH/DME agencies    Outpatient Servicies: [facility] OPPT AND OT WILL CALL TO SET UP FOLLOW UP APPOINTMENTS DME : [employer] ROLLATOR- not tall enough pt will get on own    Patient response to transportation need: Is the patient able to respond to transportation needs?: Yes In the past 12 months, has lack of transportation kept you from medical appointments or from getting medications?: No In the past 12 months, has lack of transportation kept you from meetings, work, or from getting things needed for daily living?: No   Patient/Family verbalized understanding of follow-up arrangements:  Yes  Individual responsible for coordination of the follow-up plan: [id number]  Confirmed correct DME delivered: Lucy Chris 04/24/2023    Comments (or additional information):PT DID WELL AND RECOVERED FROM HER STROKE. STILL HAS VESTIBULAR ISSUES WILL WORK ON IN OP.  Summary of Stay    Date/Time Discharge Planning CSW  04/19/23 0840 Home with daughter who is currently caring for her twin brother  who has CP. Pt was prior to admission. Between both can privide care. Await team's recommendtions for DME and follow up RGD       Ronelle Smallman, Lemar Livings

## 2023-04-24 NOTE — Progress Notes (Signed)
 Patient ID: Brittney Yu, female   DOB: February 27, 1966, 58 y.o.   MRN: [id number]  Information given to pt regarding obtaining taller rollator offered to get her a regular rolling walker which she declined. She will get on own gave her information on obtaining one.

## 2023-04-24 NOTE — Progress Notes (Signed)
 Occupational Therapy Session Note  Patient Details  Name: Brittney Yu MRN: [id number] Date of Birth: 01/18/1966  Today's Date: 04/24/2023 OT Individual Time: 1105-1203 OT Individual Time Calculation (min): 58 min    Short Term Goals: Week 2:  OT Short Term Goal 1 (Week 2): STG=LTG d/t ELOS  Skilled Therapeutic Interventions/Progress Updates:  Pt greeted seated in w/c, pt agreeable to OT intervention.      Transfers/bed mobility/functional mobility: pt completed all functional ambulation with rollator MODI.   Therapeutic activity: pt completed balance task on biodex with an emphasis on weight shifting and even weight distribution to improve balance for ADL participation. Pt completed all tasks with 92-95% accuracy with BUE support, no dizziness reported.     IADLS:pt completed various IADL tasks in apt including reaching in cabinets, placing items in oven, making up the bed, gathering wash cloths from various level and hanging up clothes in the closet. Pt completed all tasks with rollator MODI. Pt does a great job of planning and pacing herself through IADL tasks.   Education: recommended pt place all frequently used kitchen items in an accessible area for energy conservation. Recommended pt use rollator to transport ADL items as needed. Education provided on overall energy conservation strategies.     Ended session with pt seated in w/c with all needs within reach and pt MODI in room.              Therapy Documentation Precautions:  Precautions Precautions: Fall, Other (comment) Precaution Comments: Ocassional dizziness when moving quickly Restrictions Weight Bearing Restrictions Per Provider Order: No  Pain: no pain     Therapy/Group: Individual Therapy  [name] 04/24/2023, 12:25 PM

## 2023-04-24 NOTE — Plan of Care (Signed)
  Problem: RH Balance Goal: LTG Patient will maintain dynamic sitting balance (PT) Description: LTG:  Patient will maintain dynamic sitting balance with assistance during mobility activities (PT) Outcome: Completed/Met Flowsheets (Taken 04/24/2023 1800) LTG: Pt will maintain dynamic sitting balance during mobility activities with:: Independent Goal: LTG Patient will maintain dynamic standing balance (PT) Description: LTG:  Patient will maintain dynamic standing balance with assistance during mobility activities (PT) Outcome: Completed/Met Flowsheets (Taken 04/24/2023 1800) LTG: Pt will maintain dynamic standing balance during mobility activities with:: Independent with assistive device    Problem: Sit to Stand Goal: LTG:  Patient will perform sit to stand with assistance level (PT) Description: LTG:  Patient will perform sit to stand with assistance level (PT) Outcome: Completed/Met Flowsheets (Taken 04/24/2023 1800) LTG: PT will perform sit to stand in preparation for functional mobility with assistance level: Independent   Problem: RH Bed Mobility Goal: LTG Patient will perform bed mobility with assist (PT) Description: LTG: Patient will perform bed mobility with assistance, with/without cues (PT). Outcome: Completed/Met Flowsheets (Taken 04/24/2023 1800) LTG: Pt will perform bed mobility with assistance level of: Independent   Problem: RH Bed to Chair Transfers Goal: LTG Patient will perform bed/chair transfers w/assist (PT) Description: LTG: Patient will perform bed to chair transfers with assistance (PT). Outcome: Completed/Met Flowsheets (Taken 04/24/2023 1800) LTG: Pt will perform Bed to Chair Transfers with assistance level: Independent   Problem: RH Car Transfers Goal: LTG Patient will perform car transfers with assist (PT) Description: LTG: Patient will perform car transfers with assistance (PT). Outcome: Completed/Met Flowsheets (Taken 04/24/2023 1800) LTG: Pt will perform  car transfers with assist:: Independent with assistive device    Problem: RH Ambulation Goal: LTG Patient will ambulate in controlled environment (PT) Description: LTG: Patient will ambulate in a controlled environment, # of feet with assistance (PT). Outcome: Completed/Met Flowsheets (Taken 04/15/2023 1520 by Carlen Chasten M, PT) LTG: Pt will ambulate in controlled environ  assist needed:: Independent with assistive device LTG: Ambulation distance in controlled environment: >140ft using LRAD Goal: LTG Patient will ambulate in home environment (PT) Description: LTG: Patient will ambulate in home environment, # of feet with assistance (PT). Outcome: Completed/Met Flowsheets Taken 04/24/2023 1800 by Donne Gage, PT LTG: Pt will ambulate in home environ  assist needed:: Independent Taken 04/15/2023 1520 by Pippin, Carly M, PT LTG: Ambulation distance in home environment: 84ft using LRAD   Problem: RH Stairs Goal: LTG Patient will ambulate up and down stairs w/assist (PT) Description: LTG: Patient will ambulate up and down # of stairs with assistance (PT) Outcome: Completed/Met Flowsheets (Taken 04/15/2023 1520 by Pippin, Carly M, PT) LTG: Pt will ambulate up/down stairs assist needed:: Supervision/Verbal cueing LTG: Pt will  ambulate up and down number of stairs: 4 steps using B HRs

## 2023-04-24 NOTE — Plan of Care (Signed)

## 2023-04-24 NOTE — Progress Notes (Signed)
 Occupational Therapy Discharge Summary  Patient Details  Name: Brittney Yu MRN: [id number] Date of Birth: Mar 22, 1965  Date of Discharge from OT service:April 24, 2023  Today's Date: 04/24/2023 OT Individual Time: 0800-0911 OT Individual Time Calculation (min): 71 min    Patient has met 9 of 9 long term goals due to improved activity tolerance, improved balance, postural control, ability to compensate for deficits, functional use of  LEFT upper and LEFT lower extremity, improved attention, improved awareness, and improved coordination.  Patient to discharge at overall Modified Independent level.  Patient's care partner is independent to provide the necessary physical assistance at discharge.    Reasons goals not met: All goals met   Recommendation:  Patient will benefit from ongoing skilled OT services in outpatient setting to continue to advance functional skills in the area of BADL, iADL, Reduce care partner burden, and caregiving .  Equipment: No equipment provided  Reasons for discharge: treatment goals met and discharge from hospital  Patient/family agrees with progress made and goals achieved: Yes  OT Discharge Skilled Therapeutic Interventions/Progress Updates:  Pt received semi-reclined in bed presenting to be in good spirits receptive to skilled OT session reporting 0/10 pain- OT offering intermittent rest breaks, repositioning, and therapeutic support to optimize participation in therapy session. Spent time at beginning of session educating Pt on d/c plans and completing reassessment of Pt's strength, balance, vision, sensation, coordination, and cognition- see below documentation. Focused remainder of the session on BADL retraining and Pt education. Pt completed ambulatory transfers this session without AD mod I no LOB or dizziness noted. Pt completed U/LB bathing seated on TTB and U/LB dressing seated EOB mod I- Pt with appropriate safety awareness during tasks and good  application of energy conservation techniques during BADLs taking seated rest breaks, providing self increased time, and completing tasks in seated position when able. Pt able to safety retrieve clean clothes from closet and clean up towels following shower mod I without LOB. Grooming/hygiene tasks completed in standing position at sink for increased balance challenge with Pt able to wash hands, complete oral care, and apply lotion mod I, manipulating small toiletry items without dropping. Provided education on CVA recovery process, simple home modifications to increase safety, and fall prevention with Pt receptive to education. Pt was left resting in wc with call bell in reach and all needs met.  Precautions/Restrictions  Precautions Precautions: Fall;Other (comment) Precaution Comments: Ocassional dizziness when moving quickly Restrictions Weight Bearing Restrictions Per Provider Order: No Pain Pain Assessment Pain Scale: 0-10 Pain Score: 0-No pain ADL ADL Eating: Independent Where Assessed-Eating: Edge of bed Grooming: Independent Where Assessed-Grooming: Standing at sink Upper Body Bathing: Independent Where Assessed-Upper Body Bathing: Shower Lower Body Bathing: Independent Where Assessed-Lower Body Bathing: Shower Upper Body Dressing: Independent Where Assessed-Upper Body Dressing: Edge of bed Lower Body Dressing: Independent Where Assessed-Lower Body Dressing: Edge of bed Toileting: Independent Where Assessed-Toileting: Teacher, adult education: Community education officer Method: Insurance claims handler: Modified independent Web designer Method: Ship broker: Insurance underwriter: Modified independent Film/video editor Method: Designer, industrial/product: Sales promotion account executive Baseline Vision/History: 1 Wears glasses Patient Visual Report: Diplopia (Has improved since inital eval with diplopia no  longer reported, L eye slow to converge) Vision Assessment?: Yes Eye Alignment: Impaired (comment) Ocular Range of Motion: Within Functional Limits Alignment/Gaze Preference: Within Defined Limits Tracking/Visual Pursuits: Able to track stimulus in all quads without difficulty;Decreased smoothness of horizontal tracking Saccades: Additional eye shifts occurred during  testing (noted catch up movements when looking to target on L side, nystagmus noted following) Convergence: Impaired (comment) (L eye slow to converge) Visual Fields: No apparent deficits Depth Perception: Undershoots (very mild on L) Perception  Perception: Impaired Perception-Other Comments: Mild spatial orientation deficit with significant improvement since inital eval Praxis Praxis: WFL Cognition Cognition Overall Cognitive Status: Within Functional Limits for tasks assessed Arousal/Alertness: Awake/alert Orientation Level: Person;Place;Situation Person: Oriented Place: Oriented Situation: Oriented Memory: Appears intact Attention: Focused;Sustained Focused Attention: Appears intact Sustained Attention: Appears intact Selective Attention: Appears intact Awareness: Appears intact Problem Solving: Appears intact Executive Function:  (all intact) Safety/Judgment: Appears intact Brief Interview for Mental Status (BIMS) Repetition of Three Words (First Attempt): 3 Temporal Orientation: Year: Correct Temporal Orientation: Month: Accurate within 5 days Temporal Orientation: Day: Correct Recall: "Sock": Yes, no cue required Recall: "Blue": Yes, no cue required Recall: "Bed": Yes, no cue required BIMS Summary Score: 15 Sensation Sensation Light Touch: Impaired Detail Central sensation comments: reports L LE feels "numb" compared to R LE Light Touch Impaired Details: Impaired LLE;Impaired LUE Hot/Cold: Impaired Detail Hot/Cold Impaired Details: Impaired LLE;Impaired LUE Proprioception: Appears  Intact Stereognosis: Not tested Coordination Gross Motor Movements are Fluid and Coordinated: No Fine Motor Movements are Fluid and Coordinated: Yes Coordination and Movement Description: signficant improvement from inital eval, occasional R lateral lean bias Finger Nose Finger Test: [facility] Motor  Motor Motor - Discharge Observations: Signficant improvement from inital eval, mild R lean bias when walking if fatigued Mobility  Bed Mobility Bed Mobility: Supine to Sit;Sit to Supine Rolling Right: Independent with assistive device Right Sidelying to Sit: Independent with assistive device Supine to Sit: Independent with assistive device Sit to Supine: Independent with assistive device Sit to Sidelying Right: Independent with assistive device Transfers Sit to Stand: Independent with assistive device Stand to Sit: Independent with assistive device  Trunk/Postural Assessment  Cervical Assessment Cervical Assessment: Within Functional Limits Thoracic Assessment Thoracic Assessment: Within Functional Limits Lumbar Assessment Lumbar Assessment: Within Functional Limits Postural Control Postural Control: Deficits on evaluation Righting Reactions: mild delay, improvement from inital eval  Balance Balance Balance Assessed: Yes Static Sitting Balance Static Sitting - Balance Support: No upper extremity supported;Feet supported Static Sitting - Level of Assistance: 7: Independent Dynamic Sitting Balance Dynamic Sitting - Balance Support: No upper extremity supported;Feet supported;During functional activity Dynamic Sitting - Level of Assistance: 7: Independent Static Standing Balance Static Standing - Balance Support: During functional activity;No upper extremity supported Static Standing - Level of Assistance: 6: Modified independent (Device/Increase time) Dynamic Standing Balance Dynamic Standing - Balance Support: During functional activity;No upper extremity supported Dynamic Standing  - Level of Assistance: 6: Modified independent (Device/Increase time) Extremity/Trunk Assessment RUE Assessment RUE Assessment: Within Functional Limits LUE Assessment LUE Assessment: Within Functional Limits General Strength Comments: 4+/5   Geoffery Kiel 04/24/2023, 8:47 AM

## 2023-04-25 DIAGNOSIS — E785 Hyperlipidemia, unspecified: Secondary | ICD-10-CM | POA: Diagnosis not present

## 2023-04-25 DIAGNOSIS — I63542 Cerebral infarction due to unspecified occlusion or stenosis of left cerebellar artery: Secondary | ICD-10-CM | POA: Diagnosis not present

## 2023-04-25 NOTE — Progress Notes (Signed)
PROGRESS NOTE   Subjective/Complaints:  Appreciate neuropsych note   ROS: Patient denies CP, SOB, N/V/D  Objective:   No results found.  Recent Labs    04/24/23 0511  WBC 4.1  HGB 13.8  HCT 43.1  PLT 309    Recent Labs    04/24/23 0511  NA 141  K 4.3  CL 98  CO2 27  GLUCOSE 92  BUN 18  CREATININE 1.03*  CALCIUM 10.3     Intake/Output Summary (Last 24 hours) at 04/25/2023 0716 Last data filed at 04/24/2023 1816 Gross per 24 hour  Intake 716 ml  Output --  Net 716 ml        Physical Exam: Vital Signs Blood pressure (!) 111/51, pulse 67, temperature 97.9 F (36.6 C), resp. rate 17, height 5' 9.5" (1.765 m), weight 104.3 kg, last menstrual period 12/28/2016, SpO2 100%.  General: No acute distress Mood and affect are appropriate Heart: Regular rate and rhythm no rubs murmurs or extra sounds Lungs: Clear to auscultation, breathing unlabored, no rales or wheezes Abdomen: Positive bowel sounds, soft nontender to palpation, nondistended Extremities: No clubbing, cyanosis, or edema Skin: No evidence of breakdown, no evidence of rash  Skin: No evidence of breakdown, no evidence of rash Neurologic: Cranial nerves II through XII grossly intact, motor strength is 5/5 in bilateral deltoid, bicep, tricep, grip, hip flexor, knee extensors, ankle dorsiflexor and plantar flexor Romberg neg  Musculoskeletal: Full range of motion in all 4 extremities. No joint swelling   Assessment/Plan: 1. 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 Stable for D/C today F/u PCP in 3-4 weeks F/u neuro 1-2 mo F/u PM&R PRN  May resume driving no interstate or night driving x 40mo See D/C summary See D/C instructions  Care Tool:  Bathing    Body parts bathed by patient: Right arm, Left arm, Chest, Abdomen, Front perineal area, Buttocks, Right upper leg, Left upper leg, Right lower leg, Left lower leg, Face          Bathing assist Assist Level: Independent with assistive device     Upper Body Dressing/Undressing Upper body dressing   What is the patient wearing?: Pull over shirt    Upper body assist Assist Level: Independent    Lower Body Dressing/Undressing Lower body dressing      What is the patient wearing?: Underwear/pull up, Pants     Lower body assist Assist for lower body dressing: Independent     Toileting Toileting Toileting Activity did not occur (Clothing management and hygiene only): N/A (no void or bm)  Toileting assist Assist for toileting: Independent with assistive device     Transfers Chair/bed transfer  Transfers assist     Chair/bed transfer assist level: Independent with assistive device     Locomotion Ambulation   Ambulation assist      Assist level: Independent with assistive device (slow pace, extra time with increased focus to balance) Assistive device: No Device Max distance: 1000 ft   Walk 10 feet activity   Assist     Assist level: Independent Assistive device: No Device   Walk 50 feet activity   Assist    Assist level: Independent  with assistive device Assistive device: No Device    Walk 150 feet activity   Assist    Assist level: Independent with assistive device Assistive device: No Device    Walk 10 feet on uneven surface  activity   Assist     Assist level: Independent with assistive device     Wheelchair     Assist Is the patient using a wheelchair?: No             Wheelchair 50 feet with 2 turns activity    Assist            Wheelchair 150 feet activity     Assist          Blood pressure (!) 111/51, pulse 67, temperature 97.9 F (36.6 C), resp. rate 17, height 5' 9.5" (1.765 m), weight 104.3 kg, last menstrual period 12/28/2016, SpO2 100%.  Medical Problem List and Plan: 1. 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  Antithrombotics: -DVT/anticoagulation:  Pharmaceutical: Lovenox             -antiplatelet therapy: Aspirin and Plavix for three weeks followed by aspirin alone   3. Pain Management: Tylenol as needed   4. Mood/Behavior/Sleep: LCSW to evaluate and provide emotional support             -antipsychotic agents: n/a   5. Neuropsych/cognition: This patient is capable of making decisions on her own behalf.   6. Skin/Wound Care: Routine skin care checks   7. 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  Loose stool appears resolved.   -had type 5 stool 2/8   -has prn miralax and dulcolax supp    LOS: 11 days A FACE TO FACE EVALUATION WAS PERFORMED  Erick Colace 04/25/2023, 7:16 AM

## 2023-04-25 NOTE — Progress Notes (Signed)
Inpatient Rehabilitation  Medication Review by a Pharmacist  A complete drug regimen review was completed for this patient to identify any potential clinically significant medication issues.   High Risk Drug Classes Is patient taking? Indication by Medication  Antipsychotic No    Anticoagulant No   Antibiotic No    Opioid No    Antiplatelet Yes Asa/plavix 3 wks (thru 2/19) then ASA - CVA  Hypoglycemics/insulin No    Vasoactive Medication No    Chemotherapy No    Other Yes Atorvastatin - HLD Acetaminophen for pain        Type of Medication Issue Identified Description of Issue Recommendation(s)  Drug Interaction(s) (clinically significant)        Duplicate Therapy        Allergy        No Medication Administration End Date        Incorrect Dose        Additional Drug Therapy Needed        Significant med changes from prior encounter (inform family/care partners about these prior to discharge).      Other            Clinically significant medication issues were identified that warrant physician communication and completion of prescribed/recommended actions by midnight of the next day:  No      Time spent performing this drug regimen review (minutes): 10   Thank you for allowing pharmacy to be part of this patients care team. Noah Delaine, RPh Clinical Pharmacist 04/25/2023 9:06 AM

## 2023-04-27 ENCOUNTER — Telehealth: Payer: Self-pay

## 2023-04-27 NOTE — Telephone Encounter (Signed)
Transitional Care call--patient     Are you/is patient experiencing any problems since coming home? No Are there any questions regarding any aspect of care? No Are there any questions regarding medications administration/dosing? Will PCP take over prescribing Atorvastatin, I said patient question Yes Are meds being taken as prescribed? Yes Patient should review meds with caller to confirm Have there been any falls? No Has Home Health been to the house and/or have they contacted you? Outpatient [facility] If not, have you tried to contact them? Can we help you contact them? Are bowels and bladder emptying properly? Yes Are there any unexpected incontinence issues? No If applicable, is patient following bowel/bladder programs? Any fevers, problems with breathing, unexpected pain? No  Are there any skin problems or new areas of breakdown? No Has the patient/family member arranged specialty MD follow up (ie cardiology/neurology/renal/surgical/etc)? Yes Can we help arrange? Does the patient need any other services or support that we can help arrange? No Are caregivers following through as expected in assisting the patient? Yes Has the patient quit smoking, drinking alcohol, or using drugs as recommended? Yes  Appointment time 11:20 am, arrive time 11:00 am Riley Lam then Dr. Wynn Banker [phone]

## 2023-04-28 NOTE — Therapy (Addendum)
 OUTPATIENT PHYSICAL THERAPY NEURO EVALUATION   Patient Name: Brittney Yu MRN: [id number] DOB:1965/09/02, 58 y.o., female Today's Date: 05/01/2023   PCP: Hillery Aldo, MD REFERRING PROVIDER: Charlton Amor, PA-C   END OF SESSION:    PT End of Session - 05/01/23 1004     Visit Number 1    Number of Visits 24    Date for PT Re-Evaluation 07/24/23    Authorization Type Medicaid 2025    PT Start Time 1018    PT Stop Time 1100    PT Time Calculation (min) 42 min    Equipment Utilized During Treatment Gait belt    Activity Tolerance Patient tolerated treatment well    Behavior During Therapy WFL for tasks assessed/performed             History reviewed. No pertinent past medical history. Past Surgical History:  Procedure Laterality Date   SHOULDER SURGERY Left    Patient Active Problem List   Diagnosis Date Noted   Coping style affecting medical condition 04/24/2023   CVA (cerebral vascular accident) (HCC) 04/14/2023   Acute CVA (cerebrovascular accident) (HCC) 04/12/2023   Rotator cuff impingement syndrome, right 10/12/2022    ONSET DATE: 04/12/2023  REFERRING DIAG:  R42 (ICD-10-CM) - Dizziness and giddiness  I63.9 (ICD-10-CM) - Cerebral vascular accident (HCC)   THERAPY DIAG:  Difficulty in walking  Unsteadiness on feet  Other lack of coordination  Rationale for Evaluation and Treatment: Rehabilitation  SUBJECTIVE:                                                                                                                                                                                             SUBJECTIVE STATEMENT: Patient reports she still feels a little sway to the right when walking. When she gets up first thing in the morning or when she is really tired she will notice larger sway to the right. Reports she wants to work on trying to put clothes on without a bunch of leaning. Patient reports she noticed a sway to right walking in to  appointment today because this is the first time she has been out of the home since D/C from inpatient rehab. Pt reports she is likely over stimulated in the busy environment. Pt reports she has been cooking and doing light cleaning at home independently. Pt reports still experiencing occasional dizziness, but very mild.   Pt accompanied by: self  PERTINENT HISTORY: No significant PMH, otherwise healthy, until sudden onset of weakness, dizziness, and right-sided headache with emesis on 04/12/2023 when pt reported to ED. MRI of brain showed small acute infarct in  the right lateral medulla and right cerebellum. 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  PAIN:  Are you having pain? No  PRECAUTIONS: Fall  RED FLAGS: None   WEIGHT BEARING RESTRICTIONS: No  FALLS: Has patient fallen in last 6 months? No  LIVING ENVIRONMENT: Lives with: lives with their family and daughter, special needs son, and special needs granddaughter Lives in: Mobile home Stairs: No Has following equipment at home: Dan Humphreys - 4 wheeled, shower chair, Grab bars, Ramped entry, and home was already handicap accessible for her son  PLOF: Independent, Independent with household mobility without device, Independent with homemaking with ambulation, Independent with gait, and Leisure: church, spend time with granddaughter  PATIENT GOALS: Get back to walking out in the community for prolonged periods of time (1-2hrs), Walk without an assistive device, get back to living life  OBJECTIVE:  Note: Objective measures were completed at Evaluation unless otherwise noted.  DIAGNOSTIC FINDINGS:   EXAM: CT ANGIOGRAPHY HEAD AND NECK WITH AND WITHOUT CONTRAST IMPRESSION: 1. 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 No clear vasculopathy or atherosclerosis in the neck, correlate for dissection risk factors. 2. 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  Mild atheromatous calcification the cavernous carotids. 3. 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     Electronically Signed   By: Tiburcio Pea M.D.   On: 04/12/2023 08:32   EXAM: MRI HEAD WITHOUT CONTRAST IMPRESSION: Small acute infarcts in the right lateral medulla and right cerebellum. Abnormal right vertebral flow void, recommend CTA of the head and neck.     Electronically Signed   By: Tiburcio Pea M.D.   On: 04/12/2023 05:21  COGNITION: Overall cognitive status: Within functional limits for tasks assessed   SENSATION: Light touch: Impaired  and able to sense light touch on L but it is not as strong of a sensation as RLE (pt reports it is improving)  COORDINATION: WFL for tasks assessed  EDEMA:  None  MUSCLE TONE: not formally assessed, appears WNL  MUSCLE LENGTH: Not formally assessed  DTRs:  Not formally assessed  POSTURE: rounded shoulders  LOWER EXTREMITY ROM:     Active  WFL throughout observed during functional mobility, not formally assessed Right Eval Left Eval  Hip flexion    Hip extension    Hip abduction    Hip adduction    Hip internal rotation    Hip external rotation    Knee flexion    Knee extension    Ankle dorsiflexion    Ankle plantarflexion    Ankle inversion    Ankle eversion     (Blank rows = not tested)  LOWER EXTREMITY MMT:    MMT Right Eval Left Eval  Hip flexion 4+ 4  Hip extension    Hip abduction    Hip adduction    Hip internal rotation    Hip external rotation    Knee flexion 5 5  Knee extension 5 4+  Ankle dorsiflexion 5 5  Ankle plantarflexion 5 5  Ankle inversion    Ankle eversion    (Blank rows = not tested)  Manual Muscle Test Scale 0/5 = No muscle contraction can be seen or felt 1/5 = Contraction can be felt, but there is no motion 2-/5 = Part moves through incomplete ROM w/ gravity decreased 2/5 = Part moves through complete ROM w/ gravity  decreased 2+/5 = Part moves through  incomplete ROM (<50%) against gravity or through complete ROM w/ gravity 3-/5 = Part moves through incomplete ROM (>50%) against gravity 3/5 = Part moves through complete ROM against gravity 3+/5 = Part moves through complete ROM against gravity/slight resistance 4-/5= Holds test position against slight to moderate pressure 4/5 = Part moves through complete ROM against gravity/moderate resistance 4+/5= Holds test position against moderate to strong pressure 5/5 = Part moves through complete ROM against gravity/full resistance  BED MOBILITY:  Sit to supine Modified independence Supine to sit Modified independence Reports having to stabilize herself when she is really tired or first thing in morning due to R sway during this transition  TRANSFERS: Assistive device utilized: Environmental consultant - 4 wheeled, only uses rollator for community mobility, and None  Sit to stand: Complete Independence and Modified independence Stand to sit: Complete Independence and Modified independence Chair to chair: Complete Independence and Modified independence *Pt performs transfers using rollator and without rollator Floor:  not assessed, but would benefit from testing in future   RAMP:  Level of Assistance: Modified independence Assistive device utilized: Walker - 4 wheeled Ramp Comments: performs for home entry  CURB:  Level of Assistance:    Assistive device utilized:    Curb Comments: Not formally assessed  STAIRS: Level of Assistance: CGA and Min A Stair Negotiation Technique: Alternating Pattern  with No Rails Number of Stairs: 8  Height of Stairs: 6in  Comments: Performed without use of handrails for increased challenge  GAIT: Gait pattern:  slight R lean when turning quickly and step through pattern Distance walked: 282ft Assistive device utilized: Rollator into clinic but then None Level of assistance: CGA Comments: Reports using rollator only for community  mobility, ambulation in home without AD.  FUNCTIONAL TESTS:  5 times sit to stand: 12.35 seconds Timed up and go (TUG): 8.68 seconds without AD 6 minute walk test: to be assessed 10 meter walk test: 1.46m/s without AD Functional gait assessment: 16/30 without AD  PATIENT SURVEYS:  ABC scale 62%                                                                                                                              TREATMENT DATE: 05/01/23 Details of above testing.  Five times Sit to Stand Test (FTSS) "Stand up and sit down as quickly as possible 5 times, keeping your arms folded across your chest."    TIME: 12.35 seconds  Times > 13.6 seconds is associated with increased disability and morbidity (Guralnik, 2000) Times > 15 seconds is predictive of recurrent falls in healthy individuals aged 73 and older (Buatois, et al., 2008) Normal performance values in community dwelling individuals aged 48 and older (Bohannon, 2006): 60-69 years: 11.4 seconds 70-79 years: 12.6 seconds 80-89 years: 14.8 seconds  MCID: >= 2.3 seconds for Vestibular Disorders (Meretta, 2006)   Participated in Timed Up and Go (TUG): 1st trial: 9.25 seconds 2nd trial: 8.11 seconds  *Without AD and  close SBA for safety but no significant LOB Patient demonstrates decreased fall risk as indicated by requiring <13.5seconds to complete the TUG.    Pt participated in Functional Gait Assessment (FGA) with score of 16/30 demonstrating high fall risk (low fall risk 25-28, medium fall risk 19-24, and high fall risk <19). No AD utilized during test.   [facility] PT Assessment - 05/01/23 0001       Functional Gait  Assessment   Gait assessed  Yes    Gait Level Surface Walks 20 ft in less than 7 sec but greater than 5.5 sec, uses assistive device, slower speed, mild gait deviations, or deviates 6-10 in outside of the 12 in walkway width.    Change in Gait Speed Able to change speed, demonstrates mild gait deviations,  deviates 6-10 in outside of the 12 in walkway width, or no gait deviations, unable to achieve a major change in velocity, or uses a change in velocity, or uses an assistive device.    Gait with Horizontal Head Turns Performs head turns smoothly with slight change in gait velocity (eg, minor disruption to smooth gait path), deviates 6-10 in outside 12 in walkway width, or uses an assistive device.    Gait with Vertical Head Turns Performs task with slight change in gait velocity (eg, minor disruption to smooth gait path), deviates 6 - 10 in outside 12 in walkway width or uses assistive device    Gait and Pivot Turn Pivot turns safely in greater than 3 sec and stops with no loss of balance, or pivot turns safely within 3 sec and stops with mild imbalance, requires small steps to catch balance.    Step Over Obstacle Is able to step over one shoe box (4.5 in total height) but must slow down and adjust steps to clear box safely. May require verbal cueing.    Gait with Narrow Base of Support Ambulates less than 4 steps heel to toe or cannot perform without assistance.    Gait with Eyes Closed Walks 20 ft, slow speed, abnormal gait pattern, evidence for imbalance, deviates 10-15 in outside 12 in walkway width. Requires more than 9 sec to ambulate 20 ft.    Ambulating Backwards Walks 20 ft, uses assistive device, slower speed, mild gait deviations, deviates 6-10 in outside 12 in walkway width.    Steps Alternating feet, must use rail.    Total Score 16    FGA comment: High Fall Risk              10 Meter Walk Test: Patient instructed to walk 10 meters (32.8 ft) as quickly and as safely as possible at their normal speed Results: 1.42 m/s without AD (7 seconds)  Cut off scores:   Household Ambulator  < 0.4 m/s  Limited Community Ambulator  0.4 - 0.8 m/s  [employer]  > 0.8 m/s  Increased fall risk  < 1.70m/s  Crossing a Street  >1.53m/s  MCID 0.05 m/s (small), 0.13 m/s (moderate), 0.06  m/s (significant)  (ANPTA Core Set of Outcome Measures for Adults with Neurologic Conditions, 2018)   Activities-specific Balance Confidence Scale:  Score: 62% - pt demonstrates least confidence with the following activities: stairs, bending over to pick-up items, standing on toes to reach something above her head, walking across mall parking lot, walking in crowd, and stepping on/off escalator Increased risk of falls in community-dwelling, older adults <80% (79.89%)  0% = no confidence - 100% = complete confidence (ANPTA Core Set of  Outcome Measures for Adults with Neurologic Conditions, 2018)    PATIENT EDUCATION: Education details: PT POC, results of assessments performed today, HEP Person educated: Patient Education method: Explanation Education comprehension: verbalized understanding and returned demonstration  HOME EXERCISE PROGRAM:  Access Code: [email or web address] URL: [email or web address] Date: 05/01/2023 Prepared by: Casimiro Needle  Exercises - Standing Romberg to 3/4 Tandem Stance  - 1 x daily - 7 x weekly - 2 sets - 30 seconds hold - Standing March with Counter Support  - 1 x daily - 7 x weekly - 2 sets - 10 reps  GOALS: Goals reviewed with patient? Yes  SHORT TERM GOALS: Target date: 05/29/2023    Patient will be independent in home exercise program to improve strength/mobility for better functional independence with ADLs. Baseline: 05/01/23 = provided initial HEP Goal status: INITIAL  2. Patient will report participating in at least 2 community outings lasting at least 1 hour each, every week using LRAD at a modified independent level with no concerns for LOB.   Baseline: 05/01/23 = this was pt's first community outing since D/C from hospital   Goal Status: INITIAL  LONG TERM GOALS: Target date: 07/24/2023  Patient will ascend/descend 4 stairs without rail assist independently without loss of balance to improve ability to participate in community level  mobility.  Baseline: 05/01/2023: CGA/min A for 8 steps  Goal status: INITIAL  2. Patient will increase six minute walk test distance to >1000 for progression to community ambulator and improve gait ability Baseline: 05/01/2023: need to assess Goal status: INITIAL  3. Patient will increase Functional Gait Assessment score to 30/30 as to reduce fall risk and improve dynamic gait safety with community ambulation.  Baseline: 05/01/2023: 16/30  Goal Status: INITIAL  4. Patient will increase ABC scale score >80% to demonstrate better functional mobility and better confidence with ADLs.   Baseline: 05/01/2023: 62%  Goal Status: INITIAL  5. Patient will maintain balance for 30 seconds during single leg stance without loss of balance to be able to participate in independent ADLs from a standing level.  Baseline: 05/01/2023: need to assess  Goal Status: INITIAL    ASSESSMENT:  CLINICAL IMPRESSION: Patient is a 58 y.o. female who was seen today for physical therapy evaluation and treatment for imbalance following R lateral medullar and R cerebellar CVAs. Patient demonstrates increased fall risk with higher level community gait activities as noted by decreased score on Functional Gait Assessment. Patient with slight R lateral lean during dynamic gait challenges. Patient continues to require use of rollator for safe participation in community level activity at this time. Patient also demonstrates increased fall risk due to her decreased confidence with higher level community mobility tasks as noted on the ABC Scale. Pt will benefit from skilled PT to improve these deficits in order to increase QOL and ease/safety with ADLs.   OBJECTIVE IMPAIRMENTS: Abnormal gait, decreased activity tolerance, decreased balance, decreased endurance, decreased mobility, difficulty walking, dizziness, impaired sensation, and postural dysfunction.   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   REHAB POTENTIAL: Excellent  CLINICAL DECISION MAKING: Stable/uncomplicated  EVALUATION COMPLEXITY: Low  PLAN:  PT FREQUENCY: 1-2x/week  PT DURATION: 12  weeks  PLANNED INTERVENTIONS: 97164- PT Re-evaluation, 97110-Therapeutic exercises, 97530- Therapeutic activity, 97112- Neuromuscular re-education, 97535- Self Care, 82956- Manual therapy, [phone]- Gait training, [phone]- Orthotic Fit/training, [phone]- Canalith repositioning, Patient/Family education, Balance training, Stair training, Vestibular training, Visual/preceptual remediation/compensation, Cryotherapy, and Moist heat  PLAN FOR NEXT SESSION:  - Perform oculomotor exam  - 6 min walk test  - assess single leg stance time (pt wants to be able to maintain balance for standing dressing) without R lateral lean - high level dynamic stepping and dynamic gait training activities with dual-task of head turns/visual scanning   Kadarious Dikes Verdell Face, PT, DPT, NCS, CSRS Physical Therapist - [facility]  [facility]  12:42 PM 05/01/23

## 2023-05-01 ENCOUNTER — Ambulatory Visit: Payer: Medicaid Other | Admitting: Occupational Therapy

## 2023-05-01 ENCOUNTER — Encounter: Payer: Self-pay | Admitting: Physical Therapy

## 2023-05-01 ENCOUNTER — Ambulatory Visit: Payer: Medicaid Other | Attending: Physician Assistant | Admitting: Physical Therapy

## 2023-05-01 ENCOUNTER — Other Ambulatory Visit: Payer: Self-pay

## 2023-05-01 DIAGNOSIS — I63542 Cerebral infarction due to unspecified occlusion or stenosis of left cerebellar artery: Secondary | ICD-10-CM | POA: Insufficient documentation

## 2023-05-01 DIAGNOSIS — R262 Difficulty in walking, not elsewhere classified: Secondary | ICD-10-CM

## 2023-05-01 DIAGNOSIS — R278 Other lack of coordination: Secondary | ICD-10-CM

## 2023-05-01 DIAGNOSIS — M6281 Muscle weakness (generalized): Secondary | ICD-10-CM | POA: Insufficient documentation

## 2023-05-01 DIAGNOSIS — R2681 Unsteadiness on feet: Secondary | ICD-10-CM

## 2023-05-01 NOTE — Addendum Note (Signed)
 Addended by: Ginny Forth on: 05/01/2023 05:38 PM   Modules accepted: Orders

## 2023-05-01 NOTE — Therapy (Signed)
 OUTPATIENT OCCUPATIONAL THERAPY NEURO EVALUATION  Patient Name: Brittney Yu MRN: [id number] DOB:10-18-1965, 58 y.o., female Today's Date: 05/01/2023  PCP: Hillery Aldo, MD REFERRING PROVIDER: Mariam Dollar, PA-C  END OF SESSION:  OT End of Session - 05/01/23 0931     Visit Number 1    Date for OT Re-Evaluation 05/01/23    OT Start Time 0930    OT Stop Time 1015    OT Time Calculation (min) 45 min    Activity Tolerance Other (comment)    Behavior During Therapy [facility] for tasks assessed/performed             No past medical history on file. Past Surgical History:  Procedure Laterality Date   SHOULDER SURGERY Left    Patient Active Problem List   Diagnosis Date Noted   Coping style affecting medical condition 04/24/2023   CVA (cerebral vascular accident) (HCC) 04/14/2023   Acute CVA (cerebrovascular accident) (HCC) 04/12/2023   Rotator cuff impingement syndrome, right 10/12/2022    ONSET DATE: 04/14/2023  REFERRING DIAG: CVA  THERAPY DIAG:  No diagnosis found.  Rationale for Evaluation and Treatment: Rehabilitation  SUBJECTIVE:   SUBJECTIVE STATEMENT:  Pt. reports doing well at home Pt accompanied by: self  PERTINENT HISTORY:   On 04/14/23 Pt. was admitted to the hospital with a CVA, and was discharged for inpatient rehab on 04/25/2023. Pt. was diagnosed with functional deficits 2/2 Right lateral medullary, and cerebellar CVA. PMHx includes: Hyperlipidemia, Prediabetes, Central vertigo, Hyperlipidemia, Prediabetes, Central vertigo.  PRECAUTIONS: No driving  WEIGHT BEARING RESTRICTIONS: No  PAIN:  Are you having pain? No  FALLS: Has patient fallen in last 6 months? No  LIVING ENVIRONMENT: Lives with: Resides with 57 year old twins: daughter, son (special needs), and grand daughter(special needs) Lives in: Mobile home Stairs: ramped entrance Has following equipment at home: 2 rollators, grab bars, roll in shower, rased toilet with bars, and  built-in shower chair, HHSH   PLOF: Independent  PATIENT GOALS: To be as close to 100%   OBJECTIVE:  Note: Objective measures were completed at Evaluation unless otherwise noted.  HAND DOMINANCE: Right  ADLs:  Eating: Independent Grooming: Independent UB Dressing: Independent LB Dressing: Independent, little longer that usual Toileting: Independent Bathing: Independent Tub Shower transfers: Modified Independent  IADLs: Shopping: Niece will accompany Pt. 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 of activity  FUNCTIONAL OUTCOME MEASURES:   UPPER EXTREMITY ROM:    Active ROM Right Eval WNL Left Eval WNL  Shoulder flexion    Shoulder abduction    Shoulder adduction    Shoulder extension    Shoulder internal rotation    Shoulder external rotation    Elbow flexion    Elbow extension    Wrist flexion    Wrist extension    Wrist ulnar deviation    Wrist radial deviation    Wrist pronation    Wrist supination    (Blank rows = not tested)  UPPER EXTREMITY MMT:     MMT Right Eval 5/5 overall Left Eval 5/5 overall  Shoulder flexion    Shoulder abduction    Shoulder adduction    Shoulder extension    Shoulder  internal rotation    Shoulder external rotation    Middle trapezius    Lower trapezius    Elbow flexion    Elbow extension    Wrist flexion    Wrist extension    Wrist ulnar deviation    Wrist radial deviation    Wrist pronation    Wrist supination    (Blank rows = not tested)  HAND FUNCTION: Grip strength: Right: 86 lbs; Left: 61  lbs, Lateral pinch: Right: 25 lbs, Left: 25 lbs, and 3 point pinch: Right: 20 lbs, Left: 25 lbs  COORDINATION: 9 Hole Peg test: Right: 22 sec; Left: 23 sec  SENSATION: Light touch: WFL Proprioception: WFL  Positive tingling sensation in the Left UE since onset of CVA  EDEMA: N/A  COGNITION: Overall cognitive status: Within functional limits for tasks assessed  VISION: Subjective report: Wears glasses more than usual.  PERCEPTION: WFL  PRAXIS: WFL                                                                                                                           TREATMENT DATE: 05/01/23  OT initial evaluation was performed    PATIENT EDUCATION: Education details: OT services, POC, Pt. ADL/IADL functional status Person educated: Patient Education method: Medical illustrator Education comprehension: verbalized understanding  HOME EXERCISE PROGRAM: N/A  GOALS:  No goals indicated  ASSESSMENT:  CLINICAL IMPRESSION:   Patient is a 58 y.o. female who was seen today for occupational therapy evaluation for CVA. Pt. is at her baseline functioning with daily self-care, and IADL care. Pt. Reports taking a little extra time to complete some ADL tasks. Pt.'s bilateral UE strength, grip strength, pinch strength, and FMC are WNL. No further OT services are warranted at this time. Evaluation results, and recommendation for discharge were reviewed with the Pt. Pt. is in agreement.  PERFORMANCE DEFICITS: No performance deficits present requiring additional OT services.   IMPAIRMENTS: No impairments present warranting additional OT services.   CO-MORBIDITIES: may have co-morbidities  that affects occupational performance. Patient will benefit from skilled OT to address above impairments and improve overall function.  MODIFICATION OR ASSISTANCE TO COMPLETE EVALUATION: Min-Moderate modification of tasks or assist with assess necessary to complete an evaluation.  OT  OCCUPATIONAL PROFILE AND HISTORY: Detailed assessment: Review of records and additional review of physical, cognitive, psychosocial history related to current functional performance.  CLINICAL DECISION MAKING: Moderate - several treatment options, min-mod task modification necessary  REHAB POTENTIAL: Good  EVALUATION COMPLEXITY: Moderate    PLAN:  OT FREQUENCY: 1 visit  OT DURATION: 1 visit only  PLANNED INTERVENTIONS: 97535 self care/ADL training  RECOMMENDED OTHER SERVICES: PT  CONSULTED AND AGREED WITH PLAN OF CARE: Patient  PLAN FOR NEXT SESSION: No further OT services are warranted  Olegario Messier, MS, OTR/L 05/01/2023, 9:46 PM

## 2023-05-03 ENCOUNTER — Ambulatory Visit: Payer: Medicaid Other

## 2023-05-07 NOTE — Therapy (Signed)
 OUTPATIENT PHYSICAL THERAPY NEURO TREATMENT   Patient Name: Brittney Yu MRN: [id number] DOB:[birth year], 58 y.o., female Today's Date: 05/08/2023   PCP: Hillery Aldo, MD REFERRING PROVIDER: Charlton Amor, PA-C   END OF SESSION:    PT End of Session - 05/08/23 1008     Visit Number 2    Number of Visits 24    Date for PT Re-Evaluation 07/24/23    Authorization Type Medicaid 2025    Authorization - Visit Number 1    Authorization - Number of Visits 6    PT Start Time 1015    PT Stop Time 1100    PT Time Calculation (min) 45 min    Equipment Utilized During Treatment Gait belt    Activity Tolerance Patient tolerated treatment well    Behavior During Therapy WFL for tasks assessed/performed              History reviewed. No pertinent past medical history. Past Surgical History:  Procedure Laterality Date   SHOULDER SURGERY Left    Patient Active Problem List   Diagnosis Date Noted   Coping style affecting medical condition 04/24/2023   CVA (cerebral vascular accident) (HCC) 04/14/2023   Acute CVA (cerebrovascular accident) (HCC) 04/12/2023   Rotator cuff impingement syndrome, right 10/12/2022    ONSET DATE: 04/12/2023  REFERRING DIAG:  R42 (ICD-10-CM) - Dizziness and giddiness  I63.9 (ICD-10-CM) - Cerebral vascular accident (HCC)   THERAPY DIAG:  Muscle weakness (generalized)  Other lack of coordination  Difficulty in walking  Unsteadiness on feet  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    Pt accompanied by: self  PERTINENT HISTORY: No significant  PMH, otherwise healthy, until sudden onset of weakness, dizziness, and right-sided headache with emesis on 04/12/2023 when pt reported to ED. MRI of brain showed small acute infarct in the right lateral medulla and right cerebellum. 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  PAIN:  Are you having pain? No  PRECAUTIONS: Fall  RED FLAGS: None   WEIGHT BEARING RESTRICTIONS: No  FALLS: Has patient fallen in last 6 months? No  LIVING ENVIRONMENT: Lives with: lives with their family and daughter, special needs son, and special needs granddaughter  Lives in: Mobile home Stairs: No Has following equipment at home: Dan Humphreys - 4 wheeled, shower chair, Grab bars, Ramped entry, and home was already handicap accessible for her son  PLOF: Independent, Independent with household mobility without device, Independent with homemaking with ambulation, Independent with gait, and Leisure: church, spend time with granddaughter  PATIENT GOALS: Get back to walking out in the community for prolonged periods of time (1-2hrs), Walk without an assistive device, get back to living life  OBJECTIVE:  Note: Objective measures were completed at Evaluation unless otherwise noted.  DIAGNOSTIC FINDINGS:   EXAM: CT ANGIOGRAPHY HEAD AND NECK WITH AND WITHOUT CONTRAST IMPRESSION: 1. 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 No clear vasculopathy or atherosclerosis in the neck, correlate for dissection risk factors. 2. 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 Mild atheromatous calcification the cavernous carotids. 3. 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     Electronically Signed   By: Tiburcio Pea M.D.   On: 04/12/2023 08:32   EXAM: MRI HEAD WITHOUT CONTRAST IMPRESSION: Small acute infarcts in the right lateral medulla and right cerebellum. Abnormal right vertebral flow void, recommend  CTA of the head and neck.     Electronically Signed   By: Tiburcio Pea M.D.   On: 04/12/2023 05:21  COGNITION: Overall cognitive status: Within functional limits for tasks assessed   SENSATION: Light touch: Impaired  and able to sense light touch on L but it is not as strong of a sensation as RLE (pt reports it is improving)  COORDINATION: WFL for tasks assessed  EDEMA:  None  MUSCLE TONE: not formally assessed, appears WNL  MUSCLE LENGTH: Not formally assessed  DTRs:  Not formally assessed  POSTURE: rounded shoulders  LOWER EXTREMITY ROM:     Active  WFL throughout observed during functional mobility, not formally assessed Right Eval Left Eval  Hip flexion    Hip extension    Hip abduction    Hip adduction    Hip internal rotation    Hip external rotation    Knee flexion    Knee extension    Ankle dorsiflexion    Ankle plantarflexion    Ankle inversion    Ankle eversion     (Blank rows = not tested)  LOWER EXTREMITY MMT:    MMT Right Eval Left Eval  Hip flexion 4+ 4  Hip extension    Hip abduction    Hip adduction    Hip internal rotation    Hip external rotation    Knee flexion 5 5  Knee extension 5 4+  Ankle dorsiflexion 5 5  Ankle plantarflexion 5 5  Ankle inversion    Ankle eversion    (Blank rows = not tested)  Manual Muscle Test Scale 0/5 = No muscle contraction can be seen or felt 1/5 = Contraction can be felt, but there is no motion 2-/5 = Part moves through incomplete ROM w/ gravity decreased 2/5 = Part moves through complete ROM w/ gravity decreased 2+/5 = Part moves through incomplete ROM (<50%) against gravity or through complete ROM w/ gravity 3-/5 = Part moves through incomplete ROM (>50%) against gravity 3/5 = Part moves through complete ROM against gravity 3+/5 = Part moves through complete ROM against gravity/slight resistance 4-/5= Holds test position against slight to moderate pressure 4/5 = Part moves through  complete ROM against gravity/moderate resistance 4+/5= Holds test position against moderate to strong  pressure 5/5 = Part moves through complete ROM against gravity/full resistance  BED MOBILITY:  Sit to supine Modified independence Supine to sit Modified independence Reports having to stabilize herself when she is really tired or first thing in morning due to R sway during this transition  TRANSFERS: Assistive device utilized: Environmental consultant - 4 wheeled, only uses rollator for community mobility, and None  Sit to stand: Complete Independence and Modified independence Stand to sit: Complete Independence and Modified independence Chair to chair: Complete Independence and Modified independence *Pt performs transfers using rollator and without rollator Floor:  not assessed, but would benefit from testing in future   RAMP:  Level of Assistance: Modified independence Assistive device utilized: Walker - 4 wheeled Ramp Comments: performs for home entry  CURB:  Level of Assistance:    Assistive device utilized:    Curb Comments: Not formally assessed  STAIRS: Level of Assistance: CGA and Min A Stair Negotiation Technique: Alternating Pattern  with No Rails Number of Stairs: 8  Height of Stairs: 6in  Comments: Performed without use of handrails for increased challenge  GAIT: Gait pattern:  slight R lean when turning quickly and step through pattern Distance walked: 2100ft Assistive device utilized: Rollator into clinic but then None Level of assistance: CGA Comments: Reports using rollator only for community mobility, ambulation in home without AD.  FUNCTIONAL TESTS:  5 times sit to stand: 12.35 seconds Timed up and go (TUG): 8.68 seconds without AD 6 minute walk test: to be assessed 10 meter walk test: 1.104m/s without AD Functional gait assessment: 16/30 without AD  PATIENT SURVEYS:  ABC scale 62%                                                                                                                               TREATMENT DATE: 05/08/23   6 Min Walk Test:  Instructed patient to ambulate as quickly and as safely as possible for 6 minutes using LRAD. Patient was allowed to take standing rest breaks without stopping the test, but if the patient required a sitting rest break the clock would be stopped and the test would be over.  Results: 1215 feet (370.33 meters) using no device with Supervision. Results indicate that the patient has reduced endurance with ambulation compared to age matched norms.  Age Matched Norms: 62-69 yo M: 64 F: 538,MDC: 58.21 meters (190.98 feet) or 50 meters (ANPTA Core Set of Outcome Measures for Adults with Neurologic Conditions, 2018)   Single leg stance time:  RLE= 30 sec on 2 trials LLE= avg of 3 sec x multiple trials  Therapeutic Exercise - to improve LE strength with standing/walking -Sidelye hip abd- 2x 10 reps each LE (patient reported LLE more difficult)  - Supine bridging x 10 reps -Attempted single leg bridging- 1 reps each side to see if she could complete- and she performed full bridge on each LE  NMR: (both instructed to perform as part of HEP)  Single leg standing- multiple attempts on left LE after testing. Tandem standing up to 30 sec without UE support.   Oculomotor exam:  Smooth pursuit Gaze  *all normal with testing today.      PATIENT EDUCATION: Education details: PT POC, results of assessments performed today, HEP Person educated: Patient Education method: Explanation Education comprehension: verbalized understanding and returned demonstration  HOME EXERCISE PROGRAM:  Access Code: [email or web address] URL: [email or web address] Date: 05/01/2023 Prepared by: Casimiro Needle  Exercises - Standing Romberg to 3/4 Tandem Stance  - 1 x daily - 7 x weekly - 2 sets - 30 seconds hold - Standing March with Counter Support  - 1 x daily - 7 x weekly - 2 sets - 10 reps  GOALS: Goals reviewed with patient?  Yes  SHORT TERM GOALS: Target date: 05/29/2023    Patient will be independent in home exercise program to improve strength/mobility for better functional independence with ADLs. Baseline: 05/01/23 = provided initial HEP Goal status: INITIAL  2. Patient will report participating in at least 2 community outings lasting at least 1 hour each, every week using LRAD at a modified independent level with no concerns for LOB.   Baseline: 05/01/23 = this was pt's first community outing since D/C from hospital   Goal Status: INITIAL  LONG TERM GOALS: Target date: 07/24/2023  Patient will ascend/descend 4 stairs without rail assist independently without loss of balance to improve ability to participate in community level mobility.  Baseline: 05/01/2023: CGA/min A for 8 steps  Goal status: INITIAL  2. Patient will increase six minute walk test distance by 150 feet for progression to community ambulator and improve gait ability Baseline: 05/01/2023: Need to assess; 05/08/2023=1215 feet  Goal status: Revised to reflect patient initial performance  3. Patient will increase Functional Gait Assessment score to 30/30 as to reduce fall risk and improve dynamic gait safety with community ambulation.  Baseline: 05/01/2023: 16/30  Goal Status: INITIAL  4. Patient will increase ABC scale score >80% to demonstrate better functional mobility and better confidence with ADLs.   Baseline: 05/01/2023: 62%  Goal Status: INITIAL  5. Patient will maintain balance for 30 seconds during single leg stance without loss of balance to be able to participate in independent ADLs from a standing level.  Baseline: 05/01/2023: need to assess  Goal Status: INITIAL    ASSESSMENT:  CLINICAL IMPRESSION: Patient presents with good motivation for today's session including completing further assess as outlined in plan from evaluation and adding some LE strengthening/balance activities. Oculomotor exam presents as normal and patient did  measure below normal values with 6 min walk with some right sided sway - improved with distance today. She also presented with some imbalance specifically single leg standing and some left gluteal muscle weakness.   Pt will benefit from skilled PT to improve these deficits in order to increase QOL and ease/safety with ADLs.   OBJECTIVE IMPAIRMENTS: Abnormal gait, decreased activity tolerance, decreased balance, decreased endurance, decreased mobility, difficulty walking, dizziness, impaired sensation, and postural dysfunction.   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   REHAB POTENTIAL: Excellent  CLINICAL DECISION MAKING: Stable/uncomplicated  EVALUATION COMPLEXITY: Low  PLAN:  PT FREQUENCY: 1-2x/week  PT DURATION: 12 weeks  PLANNED INTERVENTIONS: 97164- PT Re-evaluation, 97110-Therapeutic exercises, 97530- Therapeutic activity, 97112- Neuromuscular re-education, 97535- Self Care, 96045- Manual therapy, [phone]- Gait training, [phone]- Orthotic Fit/training, [phone]- Canalith repositioning, Patient/Family education, Balance training, Stair training, Vestibular training, Visual/preceptual remediation/compensation, Cryotherapy, and Moist heat  PLAN FOR NEXT SESSION:  -Review gluteal/hip strengthening and progress LE strengthening - high level dynamic stepping and dynamic gait training activities with dual-task of head turns/visual scanning   Lenda Kelp, PT Physical Therapist - [facility]  [facility]  1:14 PM 05/08/23

## 2023-05-08 ENCOUNTER — Ambulatory Visit: Payer: Medicaid Other

## 2023-05-08 DIAGNOSIS — I63542 Cerebral infarction due to unspecified occlusion or stenosis of left cerebellar artery: Secondary | ICD-10-CM

## 2023-05-08 DIAGNOSIS — R278 Other lack of coordination: Secondary | ICD-10-CM

## 2023-05-08 DIAGNOSIS — R2681 Unsteadiness on feet: Secondary | ICD-10-CM

## 2023-05-08 DIAGNOSIS — M6281 Muscle weakness (generalized): Secondary | ICD-10-CM

## 2023-05-08 DIAGNOSIS — R262 Difficulty in walking, not elsewhere classified: Secondary | ICD-10-CM

## 2023-05-08 NOTE — Progress Notes (Unsigned)
   Subjective:    Patient ID: Brittney Yu, female    DOB: [birth year], 58 y.o.   MRN: [id number]  HPI: Brittney Yu is a 58 y.o. female who returns for follow up appointment for chronic pain and medication refill. states *** pain is located in  ***. rates pain ***. current exercise regime is walking and performing stretching exercises.     Pain Inventory Average Pain 0 Pain Right Now 0 My pain is  No pain  LOCATION OF PAIN  left-sided numbness: leg, arm, hand, finger, face  BOWEL Number of stools per week: 3 Oral laxative use Yes  Type of laxative Miralax, Colace   BLADDER Normal    Mobility walk without assistance ability to climb steps?  yes do you drive?  no Do you have any goals in this area?  yes  Function employed # of hrs/week Caregiver Do you have any goals in this area?  yes  Neuro/Psych numbness dizziness  Prior Studies Any changes since last visit?  no  Physicians involved in your care Any changes since last visit?  no   Family History  Problem Relation Age of Onset   Hypertension Mother    Breast cancer Father    Social History   Socioeconomic History   Marital status: Widowed    Spouse name: Not on file   Number of children: Not on file   Years of education: Not on file   Highest education level: Not on file  Occupational History   Not on file  Tobacco Use   Smoking status: Never   Smokeless tobacco: Never  Vaping Use   Vaping status: Never Used  Substance and Sexual Activity   Alcohol use: No   Drug use: No   Sexual activity: Not on file  Other Topics Concern   Not on file  Social History Narrative   Not on file   Social Drivers of Health   Financial Resource Strain: Not on file  Food Insecurity: No Food Insecurity (04/13/2023)   Hunger Vital Sign    Worried About Running Out of Food in the Last Year: Never true    Ran Out of Food in the Last Year: Never true  Transportation Needs: No Transportation Needs (04/13/2023)    PRAPARE - Administrator, Civil Service (Medical): No    Lack of Transportation (Non-Medical): No  Physical Activity: Not on file  Stress: Not on file  Social Connections: Not on file   Past Surgical History:  Procedure Laterality Date   SHOULDER SURGERY Left    No past medical history on file. LMP 12/28/2016   Opioid Risk Score:   Fall Risk Score:  `1  Depression screen PHQ 2/9      No data to display          Review of Systems  Gastrointestinal:  Positive for constipation.  Neurological:  Positive for dizziness and numbness.  All other systems reviewed and are negative.      Objective:   Physical Exam        Assessment & Plan:

## 2023-05-09 ENCOUNTER — Encounter: Payer: Medicaid Other | Attending: Registered Nurse | Admitting: Registered Nurse

## 2023-05-09 ENCOUNTER — Encounter: Payer: Self-pay | Admitting: Registered Nurse

## 2023-05-09 VITALS — BP 126/79 | HR 76 | Ht 69.5 in | Wt 221.0 lb

## 2023-05-09 DIAGNOSIS — R42 Dizziness and giddiness: Secondary | ICD-10-CM | POA: Insufficient documentation

## 2023-05-09 DIAGNOSIS — E7849 Other hyperlipidemia: Secondary | ICD-10-CM | POA: Insufficient documentation

## 2023-05-09 DIAGNOSIS — I639 Cerebral infarction, unspecified: Secondary | ICD-10-CM | POA: Diagnosis not present

## 2023-05-10 ENCOUNTER — Ambulatory Visit: Payer: Medicaid Other

## 2023-05-12 ENCOUNTER — Ambulatory Visit: Payer: Medicaid Other | Admitting: Physical Therapy

## 2023-05-15 ENCOUNTER — Ambulatory Visit: Payer: Medicaid Other | Attending: Physician Assistant

## 2023-05-15 DIAGNOSIS — R2681 Unsteadiness on feet: Secondary | ICD-10-CM | POA: Insufficient documentation

## 2023-05-15 DIAGNOSIS — R278 Other lack of coordination: Secondary | ICD-10-CM | POA: Diagnosis present

## 2023-05-15 DIAGNOSIS — M6281 Muscle weakness (generalized): Secondary | ICD-10-CM | POA: Insufficient documentation

## 2023-05-15 DIAGNOSIS — R262 Difficulty in walking, not elsewhere classified: Secondary | ICD-10-CM | POA: Diagnosis present

## 2023-05-15 DIAGNOSIS — I63542 Cerebral infarction due to unspecified occlusion or stenosis of left cerebellar artery: Secondary | ICD-10-CM | POA: Insufficient documentation

## 2023-05-15 NOTE — Therapy (Signed)
 OUTPATIENT PHYSICAL THERAPY NEURO TREATMENT   Patient Name: Brittney Yu MRN: [id number] DOB:05/25/1965, 58 y.o., female Today's Date: 05/16/2023   PCP: Hillery Aldo, MD REFERRING PROVIDER: Charlton Amor, PA-C   END OF SESSION:    PT End of Session - 05/15/23 1019     Visit Number 3    Number of Visits 24    Date for PT Re-Evaluation 07/24/23    Authorization Type Medicaid 2025    Authorization - Visit Number 2    Authorization - Number of Visits 6    PT Start Time 1018    PT Stop Time 1059    PT Time Calculation (min) 41 min    Equipment Utilized During Treatment Gait belt    Activity Tolerance Patient tolerated treatment well    Behavior During Therapy WFL for tasks assessed/performed               History reviewed. No pertinent past medical history. Past Surgical History:  Procedure Laterality Date   SHOULDER SURGERY Left    Patient Active Problem List   Diagnosis Date Noted   Coping style affecting medical condition 04/24/2023   CVA (cerebral vascular accident) (HCC) 04/14/2023   Acute CVA (cerebrovascular accident) (HCC) 04/12/2023   Rotator cuff impingement syndrome, right 10/12/2022    ONSET DATE: 04/12/2023  REFERRING DIAG:  R42 (ICD-10-CM) - Dizziness and giddiness  I63.9 (ICD-10-CM) - Cerebral vascular accident (HCC)   THERAPY DIAG:  Muscle weakness (generalized)  Other lack of coordination  Difficulty in walking  Unsteadiness on feet  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    Pt accompanied  by: self  PERTINENT HISTORY: No significant PMH, otherwise healthy, until sudden onset of weakness, dizziness, and right-sided headache with emesis on 04/12/2023 when pt reported to ED. MRI of brain showed small acute infarct in the right lateral medulla and right cerebellum. 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  PAIN:  Are you having pain? No  PRECAUTIONS: Fall  RED FLAGS: None   WEIGHT BEARING RESTRICTIONS: No  FALLS: Has patient fallen in last 6 months? No  LIVING ENVIRONMENT: Lives with: lives with their family and daughter,  special needs son, and special needs granddaughter Lives in: Mobile home Stairs: No Has following equipment at home: Dan Humphreys - 4 wheeled, shower chair, Grab bars, Ramped entry, and home was already handicap accessible for her son  PLOF: Independent, Independent with household mobility without device, Independent with homemaking with ambulation, Independent with gait, and Leisure: church, spend time with granddaughter  PATIENT GOALS: Get back to walking out in the community for prolonged periods of time (1-2hrs), Walk without an assistive device, get back to living life  OBJECTIVE:  Note: Objective measures were completed at Evaluation unless otherwise noted.  DIAGNOSTIC FINDINGS:   EXAM: CT ANGIOGRAPHY HEAD AND NECK WITH AND WITHOUT CONTRAST IMPRESSION: 1. 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 No clear vasculopathy or atherosclerosis in the neck, correlate for dissection risk factors. 2. 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 Mild atheromatous calcification the cavernous carotids. 3. 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     Electronically Signed   By: Tiburcio Pea M.D.   On: 04/12/2023 08:32   EXAM: MRI HEAD WITHOUT CONTRAST IMPRESSION: Small acute infarcts in the right lateral medulla and right cerebellum.  Abnormal right vertebral flow void, recommend CTA of the head and neck.     Electronically Signed   By: Tiburcio Pea M.D.   On: 04/12/2023 05:21  COGNITION: Overall cognitive status: Within functional limits for tasks assessed   SENSATION: Light touch: Impaired  and able to sense light touch on L but it is not as strong of a sensation as RLE (pt reports it is improving)  COORDINATION: WFL for tasks assessed  EDEMA:  None  MUSCLE TONE: not formally assessed, appears WNL  MUSCLE LENGTH: Not formally assessed  DTRs:  Not formally assessed  POSTURE: rounded shoulders  LOWER EXTREMITY ROM:     Active  WFL throughout observed during functional mobility, not formally assessed Right Eval Left Eval  Hip flexion    Hip extension    Hip abduction    Hip adduction    Hip internal rotation    Hip external rotation    Knee flexion    Knee extension    Ankle dorsiflexion    Ankle plantarflexion    Ankle inversion    Ankle eversion     (Blank rows = not tested)  LOWER EXTREMITY MMT:    MMT Right Eval Left Eval  Hip flexion 4+ 4  Hip extension    Hip abduction    Hip adduction    Hip internal rotation    Hip external rotation    Knee flexion 5 5  Knee extension 5 4+  Ankle dorsiflexion 5 5  Ankle plantarflexion 5 5  Ankle inversion    Ankle eversion    (Blank rows = not tested)  Manual Muscle Test Scale 0/5 = No muscle contraction can be seen or felt 1/5 = Contraction can be felt, but there is no motion 2-/5 = Part moves through incomplete ROM w/ gravity decreased 2/5 = Part moves through complete ROM w/ gravity decreased 2+/5 = Part moves through incomplete ROM (<50%) against gravity or through complete ROM w/ gravity 3-/5 = Part moves through incomplete ROM (>50%) against gravity 3/5 = Part moves through complete ROM against gravity 3+/5 = Part moves through complete ROM against gravity/slight resistance 4-/5= Holds test position against slight to  moderate pressure 4/5 = Part moves through complete ROM against gravity/moderate resistance 4+/5=  Holds test position against moderate to strong pressure 5/5 = Part moves through complete ROM against gravity/full resistance  BED MOBILITY:  Sit to supine Modified independence Supine to sit Modified independence Reports having to stabilize herself when she is really tired or first thing in morning due to R sway during this transition  TRANSFERS: Assistive device utilized: Environmental consultant - 4 wheeled, only uses rollator for community mobility, and None  Sit to stand: Complete Independence and Modified independence Stand to sit: Complete Independence and Modified independence Chair to chair: Complete Independence and Modified independence *Pt performs transfers using rollator and without rollator Floor:  not assessed, but would benefit from testing in future   RAMP:  Level of Assistance: Modified independence Assistive device utilized: Walker - 4 wheeled Ramp Comments: performs for home entry  CURB:  Level of Assistance:    Assistive device utilized:    Curb Comments: Not formally assessed  STAIRS: Level of Assistance: CGA and Min A Stair Negotiation Technique: Alternating Pattern  with No Rails Number of Stairs: 8  Height of Stairs: 6in  Comments: Performed without use of handrails for increased challenge  GAIT: Gait pattern:  slight R lean when turning quickly and step through pattern Distance walked: 238ft Assistive device utilized: Rollator into clinic but then None Level of assistance: CGA Comments: Reports using rollator only for community mobility, ambulation in home without AD.  FUNCTIONAL TESTS:  5 times sit to stand: 12.35 seconds Timed up and go (TUG): 8.68 seconds without AD 6 minute walk test: to be assessed 10 meter walk test: 1.33m/s without AD Functional gait assessment: 16/30 without AD  PATIENT SURVEYS:  ABC scale 62%                                                                                                                               TREATMENT DATE: 05/16/23  NMR: (both instructed to perform as part of HEP)  Single leg standing- multiple attempts on each LE- patient improving with practice-  Tandem standing up to 30 sec without UE support x 3 trials each.  Standing in corner with varying feet position and either eyes closed/open and then progressed to horizontal and vertival head motions = 18 min attempting various positions- most challenged with feet semi-tandem, EC, and head motions.   Walking with horizontal head turns down clinic hallway calling out items on sticky notes with several episodes of LOB  Self care/home management: added below to HEP and reviewed- handout provided including frequency/duration.      PATIENT EDUCATION: Education details: balance components Person educated: Patient Education method: Explanation Education comprehension: verbalized understanding and returned demonstration  HOME EXERCISE PROGRAM: Access Code: [email or web address] URL: [email or web address] Date: 05/15/2023 Prepared by: Maureen Ralphs  Exercises - Tandem Stance  - 3 x weekly - 3-5 sets - 30 sec hold - Romberg Stance with Eyes Closed  - 3 x weekly - 3 sets - up to 20 sec  hold -  Single Leg Stance  - 3 x weekly - 3 sets - up to 20 sec hold - Romberg Stance with Head Rotation  - 3 x weekly - 2- 3 sets - 10 reps Access Code: [email or web address] URL: [email or web address] Date: 05/01/2023 Prepared by: Casimiro Needle  Exercises - Standing Romberg to 3/4 Tandem Stance  - 1 x daily - 7 x weekly - 2 sets - 30 seconds hold - Standing March with Counter Support  - 1 x daily - 7 x weekly - 2 sets - 10 reps  GOALS: Goals reviewed with patient? Yes  SHORT TERM GOALS: Target date: 05/29/2023    Patient will be independent in home exercise program to improve strength/mobility for better functional independence with ADLs. Baseline:  05/01/23 = provided initial HEP Goal status: INITIAL  2. Patient will report participating in at least 2 community outings lasting at least 1 hour each, every week using LRAD at a modified independent level with no concerns for LOB.   Baseline: 05/01/23 = this was pt's first community outing since D/C from hospital   Goal Status: INITIAL  LONG TERM GOALS: Target date: 07/24/2023  Patient will ascend/descend 4 stairs without rail assist independently without loss of balance to improve ability to participate in community level mobility.  Baseline: 05/01/2023: CGA/min A for 8 steps  Goal status: INITIAL  2. Patient will increase six minute walk test distance by 150 feet for progression to community ambulator and improve gait ability Baseline: 05/01/2023: Need to assess; 05/08/2023=1215 feet  Goal status: Revised to reflect patient initial performance  3. Patient will increase Functional Gait Assessment score to 30/30 as to reduce fall risk and improve dynamic gait safety with community ambulation.  Baseline: 05/01/2023: 16/30  Goal Status: INITIAL  4. Patient will increase ABC scale score >80% to demonstrate better functional mobility and better confidence with ADLs.   Baseline: 05/01/2023: 62%  Goal Status: INITIAL  5. Patient will maintain balance for 30 seconds during single leg stance without loss of balance to be able to participate in independent ADLs from a standing level.  Baseline: 05/01/2023: need to assess  Goal Status: INITIAL    ASSESSMENT:  CLINICAL IMPRESSION: Patient arrived with good motivation for 2nd visit. She presents with continued report and demo of listing to right with walking. Provided some overall balance activities and added to her HEP without any significant difficulty today.  Pt will benefit from skilled PT to improve these deficits in order to increase QOL and ease/safety with ADLs.   OBJECTIVE IMPAIRMENTS: Abnormal gait, decreased activity tolerance, decreased  balance, decreased endurance, decreased mobility, difficulty walking, dizziness, impaired sensation, and postural dysfunction.   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   REHAB POTENTIAL: Excellent  CLINICAL DECISION MAKING: Stable/uncomplicated  EVALUATION COMPLEXITY: Low  PLAN:  PT FREQUENCY: 1-2x/week  PT DURATION: 12 weeks  PLANNED INTERVENTIONS: 97164- PT Re-evaluation, 97110-Therapeutic exercises, 97530- Therapeutic activity, [id number]- Neuromuscular re-education, 97535- Self Care, 16109- Manual therapy, [id number]- Gait training, [phone]- Orthotic Fit/training, [id number]- Canalith repositioning, Patient/Family education, Balance training, Stair training, Vestibular training, Visual/preceptual remediation/compensation, Cryotherapy, and Moist heat  PLAN FOR NEXT SESSION:  -Review gluteal/hip strengthening and progress LE  strengthening - high level dynamic stepping and dynamic gait training activities with dual-task of head turns/visual scanning   Lenda Kelp, PT Physical Therapist - [facility]  [facility]  7:04 AM 05/16/23

## 2023-05-17 ENCOUNTER — Ambulatory Visit: Payer: Medicaid Other | Admitting: Physical Therapy

## 2023-05-21 NOTE — Therapy (Signed)
 OUTPATIENT PHYSICAL THERAPY NEURO TREATMENT   Patient Name: Brittney Yu MRN: [id number] DOB:Apr 19, 1965, 58 y.o., female Today's Date: 05/21/2023   PCP: Hillery Aldo, MD REFERRING PROVIDER: Charlton Amor, PA-C   END OF SESSION:        No past medical history on file. Past Surgical History:  Procedure Laterality Date   SHOULDER SURGERY Left    Patient Active Problem List   Diagnosis Date Noted   Coping style affecting medical condition 04/24/2023   CVA (cerebral vascular accident) (HCC) 04/14/2023   Acute CVA (cerebrovascular accident) (HCC) 04/12/2023   Rotator cuff impingement syndrome, right 10/12/2022    ONSET DATE: 04/12/2023  REFERRING DIAG:  R42 (ICD-10-CM) - Dizziness and giddiness  I63.9 (ICD-10-CM) - Cerebral vascular accident (HCC)   THERAPY DIAG:  No diagnosis found.  Rationale for Evaluation and Treatment: Rehabilitation  SUBJECTIVE:                                                                                                                                                                                             SUBJECTIVE STATEMENT: *** Patient reports some left sided low back pain and experiencing some leaning forward as well as continued listing to right.    Pt accompanied by: self  PERTINENT HISTORY: No significant PMH, otherwise healthy, until sudden onset of weakness, dizziness, and right-sided headache with emesis on 04/12/2023 when pt reported to ED. MRI of brain showed small acute infarct in the right lateral medulla and right cerebellum. 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  PAIN:  Are you having pain? No  PRECAUTIONS: Fall  RED FLAGS: None   WEIGHT BEARING RESTRICTIONS: No  FALLS: Has patient fallen in last 6 months? No  LIVING ENVIRONMENT: Lives with: lives with their family and daughter, special needs son, and special needs granddaughter Lives in: Mobile home Stairs: No Has  following equipment at home: Dan Humphreys - 4 wheeled, shower chair, Grab bars, Ramped entry, and home was already handicap accessible for her son  PLOF: Independent, Independent with household mobility without device, Independent with homemaking with ambulation, Independent with gait, and Leisure: church, spend time with granddaughter  PATIENT GOALS: Get back to walking out in the community for prolonged periods of time (1-2hrs), Walk without an assistive device, get back to living life  OBJECTIVE:  Note: Objective measures were completed at Evaluation unless otherwise noted.  DIAGNOSTIC FINDINGS:   EXAM: CT ANGIOGRAPHY HEAD AND NECK WITH AND WITHOUT CONTRAST IMPRESSION: 1. 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 No clear vasculopathy or atherosclerosis in  the neck, correlate for dissection risk factors. 2. 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 Mild atheromatous calcification the cavernous carotids. 3. 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     Electronically Signed   By: Tiburcio Pea M.D.   On: 04/12/2023 08:32   EXAM: MRI HEAD WITHOUT CONTRAST IMPRESSION: Small acute infarcts in the right lateral medulla and right cerebellum. Abnormal right vertebral flow void, recommend CTA of the head and neck.     Electronically Signed   By: Tiburcio Pea M.D.   On: 04/12/2023 05:21  COGNITION: Overall cognitive status: Within functional limits for tasks assessed   SENSATION: Light touch: Impaired  and able to sense light touch on L but it is not as strong of a sensation as RLE (pt reports it is improving)  COORDINATION: WFL for tasks assessed  EDEMA:  None  MUSCLE TONE: not formally assessed, appears WNL  MUSCLE LENGTH: Not formally assessed  DTRs:  Not formally assessed  POSTURE: rounded shoulders  LOWER EXTREMITY ROM:     Active  WFL throughout observed during  functional mobility, not formally assessed Right Eval Left Eval  Hip flexion    Hip extension    Hip abduction    Hip adduction    Hip internal rotation    Hip external rotation    Knee flexion    Knee extension    Ankle dorsiflexion    Ankle plantarflexion    Ankle inversion    Ankle eversion     (Blank rows = not tested)  LOWER EXTREMITY MMT:    MMT Right Eval Left Eval  Hip flexion 4+ 4  Hip extension    Hip abduction    Hip adduction    Hip internal rotation    Hip external rotation    Knee flexion 5 5  Knee extension 5 4+  Ankle dorsiflexion 5 5  Ankle plantarflexion 5 5  Ankle inversion    Ankle eversion    (Blank rows = not tested)  Manual Muscle Test Scale 0/5 = No muscle contraction can be seen or felt 1/5 = Contraction can be felt, but there is no motion 2-/5 = Part moves through incomplete ROM w/ gravity decreased 2/5 = Part moves through complete ROM w/ gravity decreased 2+/5 = Part moves through incomplete ROM (<50%) against gravity or through complete ROM w/ gravity 3-/5 = Part moves through incomplete ROM (>50%) against gravity 3/5 = Part moves through complete ROM against gravity 3+/5 = Part moves through complete ROM against gravity/slight resistance 4-/5= Holds test position against slight to moderate pressure 4/5 = Part moves through complete ROM against gravity/moderate resistance 4+/5= Holds test position against moderate to strong pressure 5/5 = Part moves through complete ROM against gravity/full resistance  BED MOBILITY:  Sit to supine Modified independence Supine to sit Modified independence Reports having to stabilize herself when she is really tired or first thing in morning due to R sway during this transition  TRANSFERS: Assistive device utilized: Environmental consultant - 4 wheeled, only uses rollator for community mobility, and None  Sit to stand: Complete Independence and Modified independence Stand to sit: Complete Independence and Modified  independence Chair to chair: Complete Independence and Modified independence *Pt performs transfers using rollator and without rollator Floor:  not assessed, but would benefit from testing in future   RAMP:  Level of Assistance: Modified independence Assistive device utilized: Walker - 4 wheeled Ramp Comments: performs for home entry  CURB:  Level of Assistance:    Assistive device utilized:    Curb Comments: Not formally assessed  STAIRS: Level of Assistance: CGA and Min A Stair Negotiation Technique: Alternating Pattern  with No Rails Number of Stairs: 8  Height of Stairs: 6in  Comments: Performed without use of handrails for increased challenge  GAIT: Gait pattern:  slight R lean when turning quickly and step through pattern Distance walked: 247ft Assistive device utilized: Rollator into clinic but then None Level of assistance: CGA Comments: Reports using rollator only for community mobility, ambulation in home without AD.  FUNCTIONAL TESTS:  5 times sit to stand: 12.35 seconds Timed up and go (TUG): 8.68 seconds without AD 6 minute walk test: to be assessed 10 meter walk test: 1.91m/s without AD Functional gait assessment: 16/30 without AD  PATIENT SURVEYS:  ABC scale 62%                                                                                                                              TREATMENT DATE: 05/21/23  NMR: (both instructed to perform as part of HEP)  Single leg standing- multiple attempts on each LE- patient improving with practice-  Tandem standing up to 30 sec without UE support x 3 trials each.  Standing in corner with varying feet position and either eyes closed/open and then progressed to horizontal and vertival head motions = 18 min attempting various positions- most challenged with feet semi-tandem, EC, and head motions.   Walking with horizontal head turns down clinic hallway calling out items on sticky notes with several episodes of  LOB  Self care/home management: added below to HEP and reviewed- handout provided including frequency/duration.      PATIENT EDUCATION: Education details: balance components Person educated: Patient Education method: Explanation Education comprehension: verbalized understanding and returned demonstration  HOME EXERCISE PROGRAM: Access Code: [email or web address] URL: [email or web address] Date: 05/15/2023 Prepared by: Maureen Ralphs  Exercises - Tandem Stance  - 3 x weekly - 3-5 sets - 30 sec hold - Romberg Stance with Eyes Closed  - 3 x weekly - 3 sets - up to 20 sec  hold - Single Leg Stance  - 3 x weekly - 3 sets - up to 20 sec hold - Romberg Stance with Head Rotation  - 3 x weekly - 2- 3 sets - 10 reps Access Code: [email or web address] URL: [email or web address] Date: 05/01/2023 Prepared by: Casimiro Needle  Exercises - Standing Romberg to 3/4 Tandem Stance  - 1 x daily - 7 x weekly - 2 sets - 30 seconds hold - Standing March with Counter Support  - 1 x daily - 7 x weekly - 2 sets - 10 reps  GOALS: Goals reviewed with patient? Yes  SHORT TERM GOALS: Target date: 05/29/2023    Patient will be independent in home exercise program to improve strength/mobility for better functional independence with ADLs. Baseline: 05/01/23 = provided initial HEP Goal status: INITIAL  2. Patient will report  participating in at least 2 community outings lasting at least 1 hour each, every week using LRAD at a modified independent level with no concerns for LOB.   Baseline: 05/01/23 = this was pt's first community outing since D/C from hospital   Goal Status: INITIAL  LONG TERM GOALS: Target date: 07/24/2023  Patient will ascend/descend 4 stairs without rail assist independently without loss of balance to improve ability to participate in community level mobility.  Baseline: 05/01/2023: CGA/min A for 8 steps  Goal status: INITIAL  2. Patient will increase six minute walk test  distance by 150 feet for progression to community ambulator and improve gait ability Baseline: 05/01/2023: Need to assess; 05/08/2023=1215 feet  Goal status: Revised to reflect patient initial performance  3. Patient will increase Functional Gait Assessment score to 30/30 as to reduce fall risk and improve dynamic gait safety with community ambulation.  Baseline: 05/01/2023: 16/30  Goal Status: INITIAL  4. Patient will increase ABC scale score >80% to demonstrate better functional mobility and better confidence with ADLs.   Baseline: 05/01/2023: 62%  Goal Status: INITIAL  5. Patient will maintain balance for 30 seconds during single leg stance without loss of balance to be able to participate in independent ADLs from a standing level.  Baseline: 05/01/2023: need to assess  Goal Status: INITIAL    ASSESSMENT:  CLINICAL IMPRESSION: Patient arrived with good motivation for 2nd visit. She presents with continued report and demo of listing to right with walking. Provided some overall balance activities and added to her HEP without any significant difficulty today.  Pt will benefit from skilled PT to improve these deficits in order to increase QOL and ease/safety with ADLs.   OBJECTIVE IMPAIRMENTS: Abnormal gait, decreased activity tolerance, decreased balance, decreased endurance, decreased mobility, difficulty walking, dizziness, impaired sensation, and postural dysfunction.   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   REHAB POTENTIAL: Excellent  CLINICAL DECISION MAKING:  Stable/uncomplicated  EVALUATION COMPLEXITY: Low  PLAN:  PT FREQUENCY: 1-2x/week  PT DURATION: 12 weeks  PLANNED INTERVENTIONS: 97164- PT Re-evaluation, 97110-Therapeutic exercises, 97530- Therapeutic activity, 97112- Neuromuscular re-education, 97535- Self Care, 16109- Manual therapy, [phone]- Gait training, [phone]- Orthotic Fit/training, [phone]- Canalith repositioning, Patient/Family education, Balance training, Stair training, Vestibular training, Visual/preceptual remediation/compensation, Cryotherapy, and Moist heat  PLAN FOR NEXT SESSION:  -Review gluteal/hip strengthening and progress LE strengthening - high level dynamic stepping and dynamic gait training activities with dual-task of head turns/visual scanning   Lenda Kelp, PT Physical Therapist - [facility]  [facility]  8:11 PM 05/21/23

## 2023-05-22 ENCOUNTER — Ambulatory Visit: Payer: Medicaid Other

## 2023-05-22 DIAGNOSIS — M6281 Muscle weakness (generalized): Secondary | ICD-10-CM

## 2023-05-22 DIAGNOSIS — R278 Other lack of coordination: Secondary | ICD-10-CM

## 2023-05-22 DIAGNOSIS — I63542 Cerebral infarction due to unspecified occlusion or stenosis of left cerebellar artery: Secondary | ICD-10-CM

## 2023-05-22 DIAGNOSIS — R2681 Unsteadiness on feet: Secondary | ICD-10-CM

## 2023-05-22 DIAGNOSIS — R262 Difficulty in walking, not elsewhere classified: Secondary | ICD-10-CM

## 2023-05-26 ENCOUNTER — Ambulatory Visit: Payer: Medicaid Other

## 2023-05-29 ENCOUNTER — Ambulatory Visit: Payer: Medicaid Other | Admitting: Physical Therapy

## 2023-05-29 DIAGNOSIS — R2681 Unsteadiness on feet: Secondary | ICD-10-CM

## 2023-05-29 DIAGNOSIS — M6281 Muscle weakness (generalized): Secondary | ICD-10-CM

## 2023-05-29 DIAGNOSIS — R262 Difficulty in walking, not elsewhere classified: Secondary | ICD-10-CM

## 2023-05-29 DIAGNOSIS — R278 Other lack of coordination: Secondary | ICD-10-CM

## 2023-05-29 NOTE — Therapy (Signed)
 OUTPATIENT PHYSICAL THERAPY NEURO TREATMENT   Patient Name: Brittney Yu MRN: [id number] DOB:[birth year], 58 y.o., female Today's Date: 05/29/2023   PCP: Hillery Aldo, MD REFERRING PROVIDER: Charlton Amor, PA-C   END OF SESSION:    PT End of Session - 05/29/23 1019     Visit Number 5    Number of Visits 24    Date for PT Re-Evaluation 07/24/23    Authorization Type Medicaid 2025    Authorization - Number of Visits 6    PT Start Time 1018    PT Stop Time 1101    PT Time Calculation (min) 43 min    Equipment Utilized During Treatment Gait belt    Activity Tolerance Patient tolerated treatment well    Behavior During Therapy WFL for tasks assessed/performed                 No past medical history on file. Past Surgical History:  Procedure Laterality Date   SHOULDER SURGERY Left    Patient Active Problem List   Diagnosis Date Noted   Coping style affecting medical condition 04/24/2023   CVA (cerebral vascular accident) (HCC) 04/14/2023   Acute CVA (cerebrovascular accident) (HCC) 04/12/2023   Rotator cuff impingement syndrome, right 10/12/2022    ONSET DATE: 04/12/2023  REFERRING DIAG:  R42 (ICD-10-CM) - Dizziness and giddiness  I63.9 (ICD-10-CM) - Cerebral vascular accident (HCC)   THERAPY DIAG:  Muscle weakness (generalized)  Other lack of coordination  Difficulty in walking  Unsteadiness on feet  Rationale for Evaluation and Treatment: Rehabilitation  SUBJECTIVE:                                                                                                                                                                                             SUBJECTIVE STATEMENT:  Pt reports she has been having some dizziness for a few days, that "comes and goes." Pt reports she hasn't felt it as intense as the symptoms she was having the Friday prior to last PT visit. Pt reports she was trying to move a small TV table last night and staggard towards  the right, denies being fatigued at that time. Pt reports the past few days she hasn't moved as much due to the dizziness/swaying towards right.  Pt reports she has neurology apt next month (April 30th), but is on the wait list in event of a cancel so she can get in sooner.   Pt reports she checks her BP 2x/week and it is normal.  Pt accompanied by: self  PERTINENT HISTORY: No significant PMH, otherwise healthy, until sudden onset of weakness, dizziness, and  right-sided headache with emesis on 04/12/2023 when pt reported to ED. MRI of brain showed small acute infarct in the right lateral medulla and right cerebellum. 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  PAIN:  Are you having pain? No  PRECAUTIONS: Fall  RED FLAGS: None   WEIGHT BEARING RESTRICTIONS: No  FALLS: Has patient fallen in last 6 months? No  LIVING ENVIRONMENT: Lives with: lives with their family and daughter, special needs son, and special needs granddaughter Lives in: Mobile home Stairs: No Has following equipment at home: Dan Humphreys - 4 wheeled, shower chair, Grab bars, Ramped entry, and home was already handicap accessible for her son  PLOF: Independent, Independent with household mobility without device, Independent with homemaking with ambulation, Independent with gait, and Leisure: church, spend time with granddaughter  PATIENT GOALS: Get back to walking out in the community for prolonged periods of time (1-2hrs), Walk without an assistive device, get back to living life  OBJECTIVE:  Note: Objective measures were completed at Evaluation unless otherwise noted.  DIAGNOSTIC FINDINGS:   EXAM: CT ANGIOGRAPHY HEAD AND NECK WITH AND WITHOUT CONTRAST IMPRESSION: 1. 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 No clear vasculopathy or atherosclerosis in the neck, correlate for dissection risk factors. 2. 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 Mild atheromatous calcification the cavernous carotids. 3. 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     Electronically Signed   By: Tiburcio Pea M.D.   On: 04/12/2023 08:32   EXAM: MRI HEAD WITHOUT CONTRAST IMPRESSION: Small acute infarcts in the right lateral medulla and right cerebellum. Abnormal right vertebral flow void, recommend CTA of the head and neck.     Electronically Signed   By: Tiburcio Pea M.D.   On: 04/12/2023 05:21  COGNITION: Overall cognitive status: Within functional limits for tasks assessed   SENSATION: Light touch: Impaired  and able to sense light touch on L but it is not as strong of a sensation as RLE (pt reports it is improving)  COORDINATION: WFL for tasks assessed  EDEMA:  None  MUSCLE TONE: not formally assessed, appears WNL  MUSCLE LENGTH: Not formally assessed  DTRs:  Not formally assessed  POSTURE: rounded shoulders  LOWER EXTREMITY ROM:     Active  WFL throughout observed during functional mobility, not formally assessed Right Eval Left Eval  Hip flexion    Hip extension    Hip abduction    Hip adduction    Hip internal rotation    Hip external rotation    Knee flexion    Knee extension    Ankle dorsiflexion    Ankle plantarflexion    Ankle inversion    Ankle eversion     (Blank rows = not tested)  LOWER EXTREMITY MMT:    MMT Right Eval Left Eval  Hip flexion 4+ 4  Hip extension    Hip abduction    Hip adduction    Hip internal rotation    Hip external rotation    Knee flexion 5 5  Knee extension 5 4+  Ankle dorsiflexion 5 5  Ankle plantarflexion 5 5  Ankle inversion    Ankle eversion    (Blank rows = not tested)  Manual Muscle Test Scale 0/5 = No muscle contraction can be seen or felt 1/5 = Contraction can be felt, but there is no motion 2-/5 = Part moves through incomplete ROM  w/ gravity decreased 2/5 = Part moves through complete ROM w/  gravity decreased 2+/5 = Part moves through incomplete ROM (<50%) against gravity or through complete ROM w/ gravity 3-/5 = Part moves through incomplete ROM (>50%) against gravity 3/5 = Part moves through complete ROM against gravity 3+/5 = Part moves through complete ROM against gravity/slight resistance 4-/5= Holds test position against slight to moderate pressure 4/5 = Part moves through complete ROM against gravity/moderate resistance 4+/5= Holds test position against moderate to strong pressure 5/5 = Part moves through complete ROM against gravity/full resistance  BED MOBILITY:  Sit to supine Modified independence Supine to sit Modified independence Reports having to stabilize herself when she is really tired or first thing in morning due to R sway during this transition  TRANSFERS: Assistive device utilized: Environmental consultant - 4 wheeled, only uses rollator for community mobility, and None  Sit to stand: Complete Independence and Modified independence Stand to sit: Complete Independence and Modified independence Chair to chair: Complete Independence and Modified independence *Pt performs transfers using rollator and without rollator Floor:  not assessed, but would benefit from testing in future   RAMP:  Level of Assistance: Modified independence Assistive device utilized: Walker - 4 wheeled Ramp Comments: performs for home entry  CURB:  Level of Assistance:    Assistive device utilized:    Curb Comments: Not formally assessed  STAIRS: Level of Assistance: CGA and Min A Stair Negotiation Technique: Alternating Pattern  with No Rails Number of Stairs: 8  Height of Stairs: 6in  Comments: Performed without use of handrails for increased challenge  GAIT: Gait pattern:  slight R lean when turning quickly and step through pattern Distance walked: 262ft Assistive device utilized: Rollator into clinic but then None Level of assistance: CGA Comments: Reports using rollator only for  community mobility, ambulation in home without AD.  FUNCTIONAL TESTS:  5 times sit to stand: 12.35 seconds Timed up and go (TUG): 8.68 seconds without AD 6 minute walk test: to be assessed 10 meter walk test: 1.61m/s without AD Functional gait assessment: 16/30 without AD  PATIENT SURVEYS:  ABC scale 62%                                                                                                                              TREATMENT DATE: 05/29/23  Unless otherwise stated, CGA was provided and gait belt donned in order to ensure pt safety throughout session.  Noticed pt with slight R lateral lean during gait into therapy clinic.  Seated vitals at beginning of session: BP 134/72 (MAP 89), HR 82bpm   Pt reports drinking 60-80oz of water daily. Denies eating fried foods or excess sugar.   Pt denies any feelings of vertigo, truly feels like she has a R lateral lean. Denies any vertigo with sit>supine or rolling over in bed. Feel her lateral pulsion is related to CVA location.  Pt reports she is having difficulty with short term  memory. Pt reports she is also having difficulty with bowels. Educated pt to follow-up with both her PCP and neurologist regarding these concerns.  Dynamic gait training ~590ft including fast forward walking, progressed to added dual-task of head rotations to identify targets on walls, and then backwards gait (with target identification), and side stepping with CGA for safety with goal of elevating HR to promote neural recovery  HR: 103bpm  Dynamic gait training using agility ladder including the following:  - forward reciprocal pattern  - side stepping - side stepping zig-zag in/out  CGA for safety, but no instability and pt reporting little challenge with this intervention  Dynamic balance task of stepping up on purple step with 2x purple plates and holding contralateral LE up towards chest for single-leg-balance - started with 10x reps per LE and then  progressed to adding dual-task challenge of 3 random Blaze Pods on wall in front of pt to tap. Progressed from repeated LE step-ups to alternating for increased dual-task balance challenge. Completed for 13min30sec reaching 36 targets (correlates to number of step-ups) HR elevated to 125bpm *pt with impaired ability to power up through R LE compared to L when stepping up but improves with repetition and cuing Min A for balance    PATIENT EDUCATION: Education details: balance components Person educated: Patient Education method: Explanation Education comprehension: verbalized understanding and returned demonstration  HOME EXERCISE PROGRAM: Access Code: [email or web address] URL: [email or web address] Date: 05/15/2023 Prepared by: Maureen Ralphs  Exercises - Tandem Stance  - 3 x weekly - 3-5 sets - 30 sec hold - Romberg Stance with Eyes Closed  - 3 x weekly - 3 sets - up to 20 sec  hold - Single Leg Stance  - 3 x weekly - 3 sets - up to 20 sec hold - Romberg Stance with Head Rotation  - 3 x weekly - 2- 3 sets - 10 reps Access Code: [email or web address] URL: [email or web address] Date: 05/01/2023 Prepared by: Casimiro Needle  Exercises - Standing Romberg to 3/4 Tandem Stance  - 1 x daily - 7 x weekly - 2 sets - 30 seconds hold - Standing March with Counter Support  - 1 x daily - 7 x weekly - 2 sets - 10 reps  GOALS: Goals reviewed with patient? Yes  SHORT TERM GOALS: Target date: 05/29/2023    Patient will be independent in home exercise program to improve strength/mobility for better functional independence with ADLs. Baseline: 05/01/23 = provided initial HEP Goal status: INITIAL  2. Patient will report participating in at least 2 community outings lasting at least 1 hour each, every week using LRAD at a modified independent level with no concerns for LOB.   Baseline: 05/01/23 = this was pt's first community outing since D/C from hospital   Goal Status: INITIAL  LONG TERM  GOALS: Target date: 07/24/2023  Patient will ascend/descend 4 stairs without rail assist independently without loss of balance to improve ability to participate in community level mobility.  Baseline: 05/01/2023: CGA/min A for 8 steps  Goal status: INITIAL  2. Patient will increase six minute walk test distance by 150 feet for progression to community ambulator and improve gait ability Baseline: 05/01/2023: Need to assess; 05/08/2023=1215 feet  Goal status: Revised to reflect patient initial performance  3. Patient will increase Functional Gait Assessment score to 30/30 as to reduce fall risk and improve dynamic gait safety with community ambulation.  Baseline: 05/01/2023: 16/30  Goal Status: INITIAL  4. Patient will increase ABC scale score >80% to demonstrate  better functional mobility and better confidence with ADLs.   Baseline: 05/01/2023: 62%  Goal Status: INITIAL  5. Patient will maintain balance for 30 seconds during single leg stance without loss of balance to be able to participate in independent ADLs from a standing level.  Baseline: 05/01/2023: need to assess; 05/22/2023= left LE 9 sec and right LE = 30 sec  Goal Status: INITIAL    ASSESSMENT:  CLINICAL IMPRESSION:  Patient arrived today with good motivation for today's session. Focused on dynamic balance and gait challenges with goal of working at a higher intensity to promote neural recovery. Patient does report she participates in of walking in place at least 3x/week - educated on using her smart watch and phone to monitor her HR with this to track her improvement. Pt with greatest challenge during dual-task and single-leg stance interventions requiring up to min A for balance. Also, noticed R LE weakness during step-up exercise. Pt continues to demo R lateral lean/veer during dynamic gait/balance challenges. Ms. Cantrell will benefit from continued skilled PT to improve these deficits in order to increase QOL and ease/safety with  ADLs and improve community level activity.  OBJECTIVE IMPAIRMENTS: Abnormal gait, decreased activity tolerance, decreased balance, decreased endurance, decreased mobility, difficulty walking, dizziness, impaired sensation, and postural dysfunction.   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   REHAB POTENTIAL: Excellent  CLINICAL DECISION MAKING: Stable/uncomplicated  EVALUATION COMPLEXITY: Low  PLAN:  PT FREQUENCY: 1-2x/week  PT DURATION: 12 weeks  PLANNED INTERVENTIONS: 97164- PT Re-evaluation, 97110-Therapeutic exercises, 97530- Therapeutic activity, 97112- Neuromuscular re-education, 97535- Self Care, [id number]- Manual therapy, [phone]- Gait training, [phone]- Orthotic Fit/training, [phone]- Canalith repositioning, Patient/Family education, Balance training, Stair training, Vestibular training, Visual/preceptual remediation/compensation, Cryotherapy, and Moist heat  PLAN FOR NEXT SESSION:  -Review gluteal/hip strengthening and progress R LE strengthening - high level dynamic stepping and dynamic gait training activities with dual-task of head turns/visual scanning - R single leg stance    Tylyn Derwin Verdell Face, PT Physical Therapist - [facility]  [facility]  1:54 PM 05/29/23

## 2023-05-31 ENCOUNTER — Ambulatory Visit: Payer: Medicaid Other

## 2023-06-02 ENCOUNTER — Ambulatory Visit: Payer: Medicaid Other | Admitting: Physical Therapy

## 2023-06-05 ENCOUNTER — Ambulatory Visit: Payer: Medicaid Other | Admitting: Physical Therapy

## 2023-06-05 DIAGNOSIS — M6281 Muscle weakness (generalized): Secondary | ICD-10-CM | POA: Diagnosis not present

## 2023-06-05 DIAGNOSIS — R2681 Unsteadiness on feet: Secondary | ICD-10-CM

## 2023-06-05 DIAGNOSIS — R278 Other lack of coordination: Secondary | ICD-10-CM

## 2023-06-05 DIAGNOSIS — R262 Difficulty in walking, not elsewhere classified: Secondary | ICD-10-CM

## 2023-06-05 NOTE — Therapy (Signed)
 OUTPATIENT PHYSICAL THERAPY NEURO TREATMENT / DISCHARGE    Patient Name: Brittney Yu MRN: [id number] DOB:December 24, 1965, 58 y.o., female Today's Date: 06/05/2023   PCP: Hillery Aldo, MD REFERRING PROVIDER: Charlton Amor, PA-C   END OF SESSION:    PT End of Session - 06/05/23 1015     Visit Number 6    Number of Visits 24    Date for PT Re-Evaluation 07/24/23    Authorization Type Medicaid 2025    Authorization - Number of Visits 6    PT Start Time 1015    PT Stop Time 1103    PT Time Calculation (min) 48 min    Equipment Utilized During Treatment Gait belt    Activity Tolerance Patient tolerated treatment well    Behavior During Therapy WFL for tasks assessed/performed              No past medical history on file. Past Surgical History:  Procedure Laterality Date   SHOULDER SURGERY Left    Patient Active Problem List   Diagnosis Date Noted   Coping style affecting medical condition 04/24/2023   CVA (cerebral vascular accident) (HCC) 04/14/2023   Acute CVA (cerebrovascular accident) (HCC) 04/12/2023   Rotator cuff impingement syndrome, right 10/12/2022    ONSET DATE: 04/12/2023  REFERRING DIAG:  R42 (ICD-10-CM) - Dizziness and giddiness  I63.9 (ICD-10-CM) - Cerebral vascular accident (HCC)   THERAPY DIAG:  Muscle weakness (generalized)  Other lack of coordination  Difficulty in walking  Unsteadiness on feet  Rationale for Evaluation and Treatment: Rehabilitation  SUBJECTIVE:                                                                                                                                                                                             SUBJECTIVE STATEMENT:  Pt reports she is doing OK. States she was real unsteady last night when she was tired.  Reports she is not sleeping as well at night for the last several nights.  Reports she hasn't noticed as much imbalance as she was experiencing prior to last session. Continues to  report it "comes and goes."   Pt reports L arm feels "cold" since the CVA causing it to "ache" at night - she reports using sensory input such as weighted blanket and rubbing it to make it feel better.  Pt reports she is not driving yet because of concern of imbalance onset during.  Confirms she has neurology apt next month (April 30th), but is on the wait list in event of a cancel so she can get in sooner.   Pt accompanied by: self  PERTINENT HISTORY: No significant PMH, otherwise healthy, until sudden onset of weakness, dizziness, and right-sided headache with emesis on 04/12/2023 when pt reported to ED. MRI of brain showed small acute infarct in the right lateral medulla and right cerebellum. 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  PAIN:  Are you having pain? No  PRECAUTIONS: Fall  RED FLAGS: None   WEIGHT BEARING RESTRICTIONS: No  FALLS: Has patient fallen in last 6 months? No  LIVING ENVIRONMENT: Lives with: lives with their family and daughter, special needs son, and special needs granddaughter Lives in: Mobile home Stairs: No Has following equipment at home: Dan Humphreys - 4 wheeled, shower chair, Grab bars, Ramped entry, and home was already handicap accessible for her son  PLOF: Independent, Independent with household mobility without device, Independent with homemaking with ambulation, Independent with gait, and Leisure: church, spend time with granddaughter  PATIENT GOALS: Get back to walking out in the community for prolonged periods of time (1-2hrs), Walk without an assistive device, get back to living life  OBJECTIVE:  Note: Objective measures were completed at Evaluation unless otherwise noted.  DIAGNOSTIC FINDINGS:   EXAM: CT ANGIOGRAPHY HEAD AND NECK WITH AND WITHOUT CONTRAST IMPRESSION: 1. 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 No clear vasculopathy or atherosclerosis in the  neck, correlate for dissection risk factors. 2. 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 Mild atheromatous calcification the cavernous carotids. 3. 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     Electronically Signed   By: Tiburcio Pea M.D.   On: 04/12/2023 08:32   EXAM: MRI HEAD WITHOUT CONTRAST IMPRESSION: Small acute infarcts in the right lateral medulla and right cerebellum. Abnormal right vertebral flow void, recommend CTA of the head and neck.     Electronically Signed   By: Tiburcio Pea M.D.   On: 04/12/2023 05:21  COGNITION: Overall cognitive status: Within functional limits for tasks assessed   SENSATION: Light touch: Impaired  and able to sense light touch on L but it is not as strong of a sensation as RLE (pt reports it is improving)  COORDINATION: WFL for tasks assessed  EDEMA:  None  MUSCLE TONE: not formally assessed, appears WNL  MUSCLE LENGTH: Not formally assessed  DTRs:  Not formally assessed  POSTURE: rounded shoulders  LOWER EXTREMITY ROM:     Active  WFL throughout observed during functional mobility, not formally assessed Right Eval Left Eval  Hip flexion    Hip extension    Hip abduction    Hip adduction    Hip internal rotation    Hip external rotation    Knee flexion    Knee extension    Ankle dorsiflexion    Ankle plantarflexion    Ankle inversion    Ankle eversion     (Blank rows = not tested)  LOWER EXTREMITY MMT:    MMT Right Eval Left Eval  Hip flexion 4+ 4  Hip extension    Hip abduction    Hip adduction    Hip internal rotation    Hip external rotation    Knee flexion 5 5  Knee extension 5 4+  Ankle dorsiflexion 5 5  Ankle plantarflexion 5 5  Ankle inversion    Ankle eversion    (Blank rows = not tested)  Manual Muscle Test Scale 0/5 = No muscle contraction can be seen or felt 1/5 = Contraction can  be felt, but there is no motion 2-/5 = Part moves through  incomplete ROM w/ gravity decreased 2/5 = Part moves through complete ROM w/ gravity decreased 2+/5 = Part moves through incomplete ROM (<50%) against gravity or through complete ROM w/ gravity 3-/5 = Part moves through incomplete ROM (>50%) against gravity 3/5 = Part moves through complete ROM against gravity 3+/5 = Part moves through complete ROM against gravity/slight resistance 4-/5= Holds test position against slight to moderate pressure 4/5 = Part moves through complete ROM against gravity/moderate resistance 4+/5= Holds test position against moderate to strong pressure 5/5 = Part moves through complete ROM against gravity/full resistance  BED MOBILITY:  Sit to supine Modified independence Supine to sit Modified independence Reports having to stabilize herself when she is really tired or first thing in morning due to R sway during this transition  TRANSFERS: Assistive device utilized: Environmental consultant - 4 wheeled, only uses rollator for community mobility, and None  Sit to stand: Complete Independence and Modified independence Stand to sit: Complete Independence and Modified independence Chair to chair: Complete Independence and Modified independence *Pt performs transfers using rollator and without rollator Floor:  not assessed, but would benefit from testing in future   RAMP:  Level of Assistance: Modified independence Assistive device utilized: Walker - 4 wheeled Ramp Comments: performs for home entry  CURB:  Level of Assistance:    Assistive device utilized:    Curb Comments: Not formally assessed  STAIRS: Level of Assistance: CGA and Min A Stair Negotiation Technique: Alternating Pattern  with No Rails Number of Stairs: 8  Height of Stairs: 6in  Comments: Performed without use of handrails for increased challenge  GAIT: Gait pattern:  slight R lean when turning quickly and step through pattern Distance walked: 233ft Assistive device utilized: Rollator into clinic but then  None Level of assistance: CGA Comments: Reports using rollator only for community mobility, ambulation in home without AD.  FUNCTIONAL TESTS:  5 times sit to stand: 12.35 seconds Timed up and go (TUG): 8.68 seconds without AD 6 minute walk test: to be assessed 10 meter walk test: 1.57m/s without AD Functional gait assessment: 16/30 without AD  PATIENT SURVEYS:  ABC scale 62%                                                                                                                              TREATMENT DATE: 06/05/23   Educated pt on sleep hygiene.  Educated pt on recommendation to follow-up with PCP for GI referral given bowel changes discussed at last visit.   Educated pt on local stroke support group, signed pt up for emails. Educated on need to discuss return to driving with MD - therapist suggested once approved to drive, have someone ride with her in the event her symptoms start so she can pull over and have them take over. Also, recommended starting on familiar roads with little traffic.    Gait belt donned  in order to ensure pt safety throughout session; however, not needed as pt independent with all mobility tasks.  Pt reports she caught her balance 4x yesterday, but was able to do so successfully with no falls.  6 Min Walk Test:  Instructed patient to ambulate as quickly and as safely as possible for 6 minutes using LRAD. Patient was allowed to take standing rest breaks without stopping the test, but if the patient required a sitting rest break the clock would be stopped and the test would be over.  Results: 1941 feet (591 meters, Avg speed 1.22m/s) no AD, independently. Results indicate that the patient has reduced endurance with ambulation compared to age matched norms.  Age Matched Norms: 42-69 yo M: 74 F: 25, 100-79 yo M: 78 F: 471, 19-89 yo M: 417 F: 392 MDC: 58.21 meters (190.98 feet) or 50 meters (ANPTA Core Set of Outcome Measures for Adults with Neurologic  Conditions, 2018),    Stair navigation training ascending/descending 12 steps (6" height), not using HRs, independently with reciprocal stepping pattern.  Single leg stance x30sec each LE   Pt reports feeling a little off balance after previous therapy sessions when she is at the grocery store, but is able to pause and recover with no further symptoms.    Pt participated in Functional Gait Assessment (FGA) with score of 27/30 demonstrating low fall risk (low fall risk 25-28, medium fall risk 19-24, and high fall risk <19).    [facility] PT Assessment - 06/05/23 0001       Functional Gait  Assessment   Gait assessed  Yes    Gait Level Surface Walks 20 ft in less than 5.5 sec, no assistive devices, good speed, no evidence for imbalance, normal gait pattern, deviates no more than 6 in outside of the 12 in walkway width.    Change in Gait Speed Able to change speed, demonstrates mild gait deviations, deviates 6-10 in outside of the 12 in walkway width, or no gait deviations, unable to achieve a major change in velocity, or uses a change in velocity, or uses an assistive device.   slight difficulty changing to slow speed   Gait with Horizontal Head Turns Performs head turns smoothly with no change in gait. Deviates no more than 6 in outside 12 in walkway width    Gait with Vertical Head Turns Performs head turns with no change in gait. Deviates no more than 6 in outside 12 in walkway width.    Gait and Pivot Turn Pivot turns safely within 3 sec and stops quickly with no loss of balance.    Step Over Obstacle Is able to step over 2 stacked shoe boxes taped together (9 in total height) without changing gait speed. No evidence of imbalance.    Gait with Narrow Base of Support Ambulates 7-9 steps.    Gait with Eyes Closed Walks 20 ft, uses assistive device, slower speed, mild gait deviations, deviates 6-10 in outside 12 in walkway width. Ambulates 20 ft in less than 9 sec but greater than 7 sec.     Ambulating Backwards Walks 20 ft, no assistive devices, good speed, no evidence for imbalance, normal gait    Steps Alternating feet, no rail.    Total Score 27    FGA comment: Low Fall Risk             Activities-specific Balance Confidence Scale:  Score: 86.88% - pt reporting overall 80-100% confident on all items, except stepping on/off an escalator or  walking on icy sidewalks Increased risk of falls in community-dwelling, older adults <80% (79.89%)  0% = no confidence - 100% = complete confidence (ANPTA Core Set of Outcome Measures for Adults with Neurologic Conditions, 2018)   Noticed pt with no R lateral lean throughout therapy session.  Pt in agreement with plan to D/C from therapy services at this time while continuing her HEP. Pt educated on notifying MDs if she feels she has had a change in status and would benefit from returning to therapy in future.    PATIENT EDUCATION: Education details: balance components Person educated: Patient Education method: Explanation Education comprehension: verbalized understanding and returned demonstration  HOME EXERCISE PROGRAM: Access Code: [email or web address] URL: [email or web address] Date: 05/15/2023 Prepared by: Maureen Ralphs  Exercises - Tandem Stance  - 3 x weekly - 3-5 sets - 30 sec hold - Romberg Stance with Eyes Closed  - 3 x weekly - 3 sets - up to 20 sec  hold - Single Leg Stance  - 3 x weekly - 3 sets - up to 20 sec hold - Romberg Stance with Head Rotation  - 3 x weekly - 2- 3 sets - 10 reps Access Code: [email or web address] URL: [email or web address] Date: 05/01/2023 Prepared by: Casimiro Needle  Exercises - Standing Romberg to 3/4 Tandem Stance  - 1 x daily - 7 x weekly - 2 sets - 30 seconds hold - Standing March with Counter Support  - 1 x daily - 7 x weekly - 2 sets - 10 reps  GOALS: Goals reviewed with patient? Yes  SHORT TERM GOALS: Target date: 05/29/2023    Patient will be independent in  home exercise program to improve strength/mobility for better functional independence with ADLs. Baseline: 05/01/23 = provided initial HEP 06/05/2023: pt compliant with all HEP exercises Goal status: MET  2. Patient will report participating in at least 2 community outings lasting at least 1 hour each, every week using LRAD at a modified independent level with no concerns for LOB.   Baseline: 05/01/23 = this was pt's first community outing since D/C from hospital 06/05/2023: met, pt reports going to grocery store after therapy visits   Goal Status: MET  LONG TERM GOALS: Target date: 07/24/2023  Patient will ascend/descend 4 stairs without rail assist independently without loss of balance to improve ability to participate in community level mobility.  Baseline: 05/01/2023: CGA/min A for 8 steps  06/05/2023: met Goal status: MET  2. Patient will increase six minute walk test distance by 150 feet for progression to community ambulator and improve gait ability Baseline: 05/01/2023: Need to assess; 05/08/2023=1215 feet 06/05/2023: 1941 feet (591 meters, Avg speed 1.84m/s) no AD  Goal status: MET  3. Patient will increase Functional Gait Assessment score to 30/30 as to reduce fall risk and improve dynamic gait safety with community ambulation.  Baseline: 05/01/2023: 16/30 06/05/2023: 27/30   Goal Status: NOT MET, but within 3 points  4. Patient will increase ABC scale score >80% to demonstrate better functional mobility and better confidence with ADLs.   Baseline: 05/01/2023: 62% 06/05/2023: 86.88%   Goal Status: MET  5. Patient will maintain balance for 30 seconds during single leg stance without loss of balance to be able to participate in independent ADLs from a standing level.  Baseline: 05/01/2023: need to assess; 05/22/2023= left LE 9 sec and right LE = 30 sec  06/05/2023: L and R LE: 30 sec - pt reports she is now able to safely maintain balance to perform LB dressing in  standing!  Goal Status:  MET    ASSESSMENT:  CLINICAL IMPRESSION:  Brittney Yu has demonstrated significant improvement in her balance, LE strength, and gait all resulting in decreased fall risk and increased independence with ADLs and functional mobility at household and community levels. She demonstrates ability to perform single leg stance for at least 30seconds on each LE with reports of now being able to safely and successfully perform her LB dressing in standing. Pt is able to navigate stairs without HRs independently and is reporting being able to participate in at least 2 community level activities each week, with minimal balance instability, and pt reports having strategies to manage her symptoms if/when needed. Pt demonstrates decreased fall risk as noted on ABC scale, FGA, and 6 min walk test. Pt continues to report her dizziness/imbalance symptoms "come and go" and she has an apt to follow-up with neurology. Pt is still <2 months post her CVA and is still in the acute stages of healing with continued improvement expected. Brittney Yu is being discharged from therapy services at this time and has been an absolute pleasure to work with. Please re-consult if therapy is needed in future.   OBJECTIVE IMPAIRMENTS: Abnormal gait, decreased activity tolerance, decreased balance, decreased endurance, decreased mobility, difficulty walking, dizziness, impaired sensation, and postural dysfunction.   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   REHAB POTENTIAL: Excellent  CLINICAL DECISION MAKING:  Stable/uncomplicated  EVALUATION COMPLEXITY: Low  PLAN:  PT FREQUENCY: 1-2x/week  PT DURATION: 12 weeks  PLANNED INTERVENTIONS: 97164- PT Re-evaluation, 97110-Therapeutic exercises, 97530- Therapeutic activity, 97112- Neuromuscular re-education, 97535- Self Care, 78295- Manual therapy, [phone]- Gait training, [phone]- Orthotic Fit/training, [phone]- Canalith repositioning, Patient/Family education, Balance training, Stair training, Vestibular training, Visual/preceptual remediation/compensation, Cryotherapy, and Moist heat  PLAN FOR NEXT SESSION: Discharged   Rodolfo Gaster Verdell Face, PT Physical Therapist - [facility]  [facility]  1:28 PM 06/05/23

## 2023-06-09 ENCOUNTER — Ambulatory Visit: Payer: Medicaid Other

## 2023-06-12 ENCOUNTER — Ambulatory Visit: Payer: Medicaid Other

## 2023-06-12 ENCOUNTER — Other Ambulatory Visit: Payer: Self-pay | Admitting: Family Medicine

## 2023-06-12 DIAGNOSIS — Z1231 Encounter for screening mammogram for malignant neoplasm of breast: Secondary | ICD-10-CM

## 2023-06-13 ENCOUNTER — Ambulatory Visit
Admission: RE | Admit: 2023-06-13 | Discharge: 2023-06-13 | Disposition: A | Discharge status: Home / Self Care | Source: Ambulatory Visit | Attending: Family Medicine | Admitting: Family Medicine

## 2023-06-13 DIAGNOSIS — Z1231 Encounter for screening mammogram for malignant neoplasm of breast: Secondary | ICD-10-CM | POA: Diagnosis present

## 2023-06-16 ENCOUNTER — Ambulatory Visit: Payer: Medicaid Other | Admitting: Physical Therapy

## 2023-06-19 ENCOUNTER — Ambulatory Visit: Payer: Medicaid Other | Admitting: Physical Therapy

## 2023-06-21 ENCOUNTER — Ambulatory Visit: Payer: Medicaid Other

## 2023-06-21 ENCOUNTER — Encounter: Payer: Medicaid Other | Admitting: Occupational Therapy

## 2023-06-22 ENCOUNTER — Telehealth: Payer: Self-pay

## 2023-06-22 NOTE — Telephone Encounter (Signed)
error 

## 2023-06-22 NOTE — Telephone Encounter (Signed)
 Copied from CRM [id number]. Topic: General - Other >> Jun 22, 2023  1:02 PM Shardie S wrote: Reason for CRM: Patient states that she has [employer] and was told by her insurance provider that in order to have provider's information added to her insurance card that the clinic's information is needing to be updated in "[place] [facility]."

## 2023-06-23 ENCOUNTER — Ambulatory Visit: Payer: Medicaid Other

## 2023-06-26 ENCOUNTER — Ambulatory Visit: Payer: Medicaid Other | Admitting: Physical Therapy

## 2023-06-26 ENCOUNTER — Encounter: Payer: Self-pay | Admitting: Family Medicine

## 2023-06-26 ENCOUNTER — Ambulatory Visit: Admitting: Family Medicine

## 2023-06-26 VITALS — BP 117/77 | HR 69 | Ht 69.5 in | Wt 208.8 lb

## 2023-06-26 DIAGNOSIS — Z7689 Persons encountering health services in other specified circumstances: Secondary | ICD-10-CM

## 2023-06-26 DIAGNOSIS — I63542 Cerebral infarction due to unspecified occlusion or stenosis of left cerebellar artery: Secondary | ICD-10-CM

## 2023-06-26 DIAGNOSIS — H814 Vertigo of central origin: Secondary | ICD-10-CM

## 2023-06-26 DIAGNOSIS — R7303 Prediabetes: Secondary | ICD-10-CM

## 2023-06-26 DIAGNOSIS — R7401 Elevation of levels of liver transaminase levels: Secondary | ICD-10-CM | POA: Diagnosis not present

## 2023-06-26 NOTE — Patient Instructions (Signed)

## 2023-06-26 NOTE — Progress Notes (Signed)
 New Patient Office Visit  Subjective   Patient ID: Brittney Yu, female    DOB: Apr 03, 1965  Age: 58 y.o. MRN: [id number]  CC:  Chief Complaint  Patient presents with   New Patient (Initial Visit)    Patient is here to get established with the practice. States she does have some dizziness which she said started after recent stroke.   HPI Brittney Yu is a 58 year old female who presents to establish with [facility] at [facility].   CC: Patient here to establish care  Last PCP: [facility] with Dr. Hillery Aldo  Specialist: physical medicine/rehab, neurology  PMHx: right lateral medullary and cerebellar CVA, HLD, prediabetes, central vertigo  Hospital visit for acute stroke on 1/31-2/11-- reports this was due to HLD  Recent labs were done on 06/19/2023- lipid panel normal. Elevated LFTs.  Short-term memory is not as great as it was She does have some dizziness that causes her to feel off-balance  She is on the waitlist for neurology- she wants to get to the root problem and does not want to be on medication at this time.   R shoulder pain- comes/goes  Constipation- new, she is taking Colace and MiraLax   Reports she was working to lower her blood pressure through healthy diet and exercise  Checks BP at home, at least 2-3 times per week or if she is feeling off. Reports her BP is similar to reading in office. Highest reading is around 140s/80s.  Denies salt in diet, no fast food, watches her sugar intake, no snacks.    Outpatient Encounter Medications as of 06/26/2023  Medication Sig   acetaminophen (TYLENOL) 325 MG tablet Take 1-2 tablets (325-650 mg total) by mouth every 4 (four) hours as needed for mild pain (pain score 1-3).   aspirin EC 81 MG tablet Take 1 tablet (81 mg total) by mouth daily. Swallow whole.   atorvastatin (LIPITOR) 80 MG tablet Take 1 tablet (80 mg total) by mouth daily.   No facility-administered encounter  medications on file as of 06/26/2023.    Patient Active Problem List   Diagnosis Date Noted   Coping style affecting medical condition 04/24/2023   CVA (cerebral vascular accident) (HCC) 04/14/2023   Acute CVA (cerebrovascular accident) (HCC) 04/12/2023   Rotator cuff impingement syndrome, right 10/12/2022   History reviewed. No pertinent past medical history. Past Surgical History:  Procedure Laterality Date   SHOULDER SURGERY Left    Family History  Problem Relation Age of Onset   Hypertension Mother    Breast cancer Father    CVA Maternal Grandmother    Transient ischemic attack Maternal Aunt    Social History   Socioeconomic History   Marital status: Widowed    Spouse name: Not on file   Number of children: Not on file   Years of education: Not on file   Highest education level: Not on file  Occupational History   Not on file  Tobacco Use   Smoking status: Never   Smokeless tobacco: Never  Vaping Use   Vaping status: Never Used  Substance and Sexual Activity   Alcohol use: No   Drug use: No   Sexual activity: Not on file  Other Topics Concern   Not on file  Social History Narrative   Not on file   Social Drivers of Health   Financial Resource Strain: Not on file  Food Insecurity: No Food Insecurity (04/13/2023)   Hunger Vital  Sign    Worried About Programme researcher, broadcasting/film/video in the Last Year: Never true    Ran Out of Food in the Last Year: Never true  Transportation Needs: No Transportation Needs (04/13/2023)   PRAPARE - Administrator, Civil Service (Medical): No    Lack of Transportation (Non-Medical): No  Physical Activity: Not on file  Stress: Not on file  Social Connections: Not on file  Intimate Partner Violence: Not At Risk (04/13/2023)   Humiliation, Afraid, Rape, and Kick questionnaire    Fear of Current or Ex-Partner: No    Emotionally Abused: No    Physically Abused: No    Sexually Abused: No   Outpatient Medications Prior to Visit   Medication Sig Dispense Refill   acetaminophen (TYLENOL) 325 MG tablet Take 1-2 tablets (325-650 mg total) by mouth every 4 (four) hours as needed for mild pain (pain score 1-3).     aspirin EC 81 MG tablet Take 1 tablet (81 mg total) by mouth daily. Swallow whole. 30 tablet 0   atorvastatin (LIPITOR) 80 MG tablet Take 1 tablet (80 mg total) by mouth daily. 30 tablet 0   No facility-administered medications prior to visit.   No Known Allergies   ROS: see HPI    Objective  Today's Vitals   06/26/23 1315  BP: 117/77  Pulse: 69  SpO2: 98%  Weight: 208 lb 12.8 oz (94.7 kg)  Height: 5' 9.5" (1.765 m)   GENERAL: Well-appearing, in NAD. Well nourished.  SKIN: Pink, warm and dry. No rash, lesion, ulceration, or ecchymoses.  Head: Normocephalic. NECK: Trachea midline. Full ROM w/o pain or tenderness. No lymphadenopathy.  EARS: Tympanic membranes are intact, translucent without bulging and without drainage. Appropriate landmarks visualized.  EYES: Conjunctiva clear without exudates. EOMI, PERRL, no drainage present.  NOSE: Septum midline w/o deformity. Nares patent, mucosa pink and non-inflamed w/o drainage. No sinus tenderness.  THROAT: Uvula midline. Oropharynx clear. Tonsils non-inflamed without exudate. Mucous membranes pink and moist.  RESPIRATORY: Chest wall symmetrical. Respirations even and non-labored. Breath sounds clear to auscultation bilaterally.  CARDIAC: S1, S2 present, regular rate and rhythm without murmur or gallops. Peripheral pulses 2+ bilaterally.  MSK: Muscle tone and strength appropriate for age. Joints w/o tenderness, redness, or swelling.  EXTREMITIES: Without clubbing, cyanosis, or edema.  NEUROLOGIC: No motor or sensory deficits. Steady, even gait. C2-C12 intact.  PSYCH/MENTAL STATUS: Alert, oriented x 3. Cooperative, appropriate mood and affect.     Assessment & Plan:   1. Encounter to establish care (Primary) Patient is a 58- year-old female who presents  today to establish care with primary care at [facility]. Reviewed the past medical history, family history, social history, surgical history, medications and allergies today- updates made as indicated. Patient has concerns today about elevated liver enzymes and ongoing dizziness.    2. Cerebrovascular accident (CVA) due to occlusion of left cerebellar artery [facility]) Review of chart- patient was admitted to [facility] 04/14/2023-04/25/2023 for CVA. In office today, vital signs stable. Currently taking aspirin 81mg  & atorvastatin 80mg  daily. Denies new neurological episodes. No muscle weakness present- strength 5/5 in all extremities. No neurological deficits present on exam. All questions answered appropriately. No refills needed today.     3. Central vestibular vertigo Patient reports ongoing slight dizziness that has been present after her CVA. Reports she has an upcoming neurology appt.  4. Prediabetes Recent hemoglobin A1c completed 04/12/2023 with results of 5.7%. Discussed importance of lifestyle modifications- including healthy  diet and daily exercise. She has been participating in new lifestyle routine since her stroke to help minimize her risk of another neurological episode. Currently not on medications.   5. Transaminitis Recent CMP completed on 06/19/2023 with elevated alk phos at 148, AST at 48, and ALT at 71. Denies N/V, abdominal pain, fatigue, weakness, myalgias, dark urine, decreased urinary output. Will continue to monitor liver function and may need to adjust statin medication.    Return in about 8 weeks (around 08/21/2023) for chronic conditions f/u (with labs) .   Wilhelmena Hanson, FNP

## 2023-06-27 ENCOUNTER — Ambulatory Visit: Payer: Medicaid Other | Admitting: Physical Therapy

## 2023-06-30 ENCOUNTER — Ambulatory Visit: Payer: Medicaid Other

## 2023-07-04 ENCOUNTER — Ambulatory Visit: Payer: Medicaid Other | Admitting: Physical Therapy

## 2023-07-07 ENCOUNTER — Ambulatory Visit: Payer: Medicaid Other

## 2023-07-10 ENCOUNTER — Ambulatory Visit: Payer: Medicaid Other | Admitting: Physical Therapy

## 2023-07-11 ENCOUNTER — Ambulatory Visit: Payer: Medicaid Other

## 2023-07-12 ENCOUNTER — Ambulatory Visit: Payer: Medicaid Other | Admitting: Neurology

## 2023-07-12 ENCOUNTER — Encounter: Payer: Self-pay | Admitting: Neurology

## 2023-07-12 VITALS — BP 122/76 | HR 68 | Ht 69.5 in | Wt 202.0 lb

## 2023-07-12 DIAGNOSIS — I639 Cerebral infarction, unspecified: Secondary | ICD-10-CM

## 2023-07-12 DIAGNOSIS — I63211 Cerebral infarction due to unspecified occlusion or stenosis of right vertebral arteries: Secondary | ICD-10-CM

## 2023-07-12 NOTE — Progress Notes (Signed)
 [facility]  PATIENT: Brittney Yu DOB: 07/18/1965  REQUESTING CLINICIAN: Lacretia Piccolo, PA-C HISTORY FROM: Patient  REASON FOR VISIT: Right cerebellar and medullary stroke    HISTORICAL  CHIEF COMPLAINT:  Chief Complaint  Patient presents with   New Patient (Initial Visit)    Pt in 12, here since Pt is referred for CVA. Pt states she has some dizziness on and off, as well as some tingling on her left arm.     HISTORY OF PRESENT ILLNESS:  This is a 58 year old woman with no reported past medical history who is presenting after being admitted in the end of January for dizziness, right-sided headache and vomiting and found to have a cerebellar stroke.  During her workup she was found to have an occluded right vertebral artery, cerebellar strokes and right  lateral medullary stroke.  Stroke etiology large vessel disease. She was started on DAPT for 21 days and recommended aspirin  alone.  She was also started on atorvastatin  for hyperlipidemia.  She was sent to acute rehab, did well and actually did outpatient PT.  Currently she tells me that she is doing better.  She still however veer to the right when walking, also has some sensory changes with in the left upper extremity, feels like it is cold.  These sensation changes are intermittent, she denies any pain.  Denies any recent falls.    Hospital course and summary  Brittney Yu is a 58 y.o. female arrive via EMS to [facility] ED on 04/12/2023 complaining of sudden onset of weakness, dizziness, right-sided headache and emesis x 1. She was hemodynamically stable and labs were unremarkable. She has no significant medical history. 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 Also showed abnormal right vertebral flow void and recommended CTA of head and neck. Admitted to hospitalist service and neurology consulted.  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 She was given Plavix  300 mg load and is now on daily aspirin  81 mg and Plavix  75 mg. 2D echo with EF of 60 to 65%. Hemoglobin A1c 5.7%. 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 No evidence of embolic source. She was started on atorvastatin  80 mg nightly. Required min assist for mobility. Right lateral lean noted.   Brittney Yu was admitted to rehab 04/14/2023 for inpatient therapies to consist of PT, ST and OT at least three hours five days a week. Past admission physiatrist, therapy team and rehab RN have worked together to provide customized collaborative inpatient rehab. Dysarthria and word finding deficits noted at admission.  Central vertigo treated with as needed meclizine .  Follow-up labs on 2/03 within normal limits. SLP eval 1/31 for word-finding deficits and dysarthria. Continent of bowel and bladder. Avoided laxatives secondary to loose stool. Meclizine  not needed and discontinued. Neuropsychology eval on 2/10.    Blood pressures were monitored on TID basis and remains stable   Rehab course: During patient's stay in rehab weekly team conferences were held to monitor patient's progress, set goals and discuss barriers to discharge. At admission, patient required an assist with mobility with basic self-care skills.   She  has had improvement in activity tolerance, balance, postural control as well as ability to compensate for deficits. She has had improvement in functional use RUE/LUE  and RLE/LLE as well as improvement in  awareness   Patient has met 9 of 9 long term goals due to improved activity tolerance, improved balance, increased strength, functional use of  right upper extremity and right lower extremity, improved attention, improved awareness, and improved coordination.  Patient to discharge at an ambulatory level Modified Independent.   Patient's  care partner is independent to provide the necessary physical assistance at discharge.    OTHER MEDICAL CONDITIONS: Hyperlipidemia, CVA   REVIEW OF SYSTEMS: Full 14 system review of systems performed and negative with exception of: As noted in the HPI   ALLERGIES: No Known Allergies  HOME MEDICATIONS: Outpatient Medications Prior to Visit  Medication Sig Dispense Refill   acetaminophen  (TYLENOL ) 325 MG tablet Take 1-2 tablets (325-650 mg total) by mouth every 4 (four) hours as needed for mild pain (pain score 1-3).     aspirin  EC 81 MG tablet Take 1 tablet (81 mg total) by mouth daily. Swallow whole. 30 tablet 0   atorvastatin  (LIPITOR) 80 MG tablet Take 1 tablet (80 mg total) by mouth daily. 30 tablet 0   Docusate Calcium  (STOOL SOFTENER PO) Take by mouth.     No facility-administered medications prior to visit.    PAST MEDICAL HISTORY: Past Medical History:  Diagnosis Date   Elevated cholesterol     PAST SURGICAL HISTORY: Past Surgical History:  Procedure Laterality Date   SHOULDER SURGERY Left     FAMILY HISTORY: Family History  Problem Relation Age of Onset   Hypertension Mother    Breast cancer Father    CVA Maternal Grandmother    Transient ischemic attack Maternal Aunt     SOCIAL HISTORY: Social History   Socioeconomic History   Marital status: Widowed    Spouse name: Not on file   Number of children: Not on file   Years of education: Not on file   Highest education level: Not on file  Occupational History   Not on file  Tobacco Use   Smoking status: Never   Smokeless tobacco: Never  Vaping Use   Vaping status: Never Used  Substance and Sexual Activity   Alcohol use: No   Drug use: No   Sexual activity: Not on file  Other Topics Concern   Not on file  Social History Narrative   Not on file   Social Drivers of Health   Financial Resource Strain: Not on file  Food Insecurity: No Food Insecurity (04/13/2023)   Hunger Vital Sign    Worried  About Running Out of Food in the Last Year: Never true    Ran Out of Food in the Last Year: Never true  Transportation Needs: No Transportation Needs (04/13/2023)   PRAPARE - Administrator, Civil Service (Medical): No    Lack of Transportation (Non-Medical): No  Physical Activity: Not on file  Stress: Not on file  Social Connections: Not on file  Intimate Partner Violence: Not At Risk (04/13/2023)   Humiliation, Afraid, Rape, and Kick questionnaire    Fear of Current or Ex-Partner: No    Emotionally Abused: No    Physically Abused: No    Sexually Abused: No    PHYSICAL EXAM  GENERAL EXAM/CONSTITUTIONAL: Vitals:  Vitals:   07/12/23 1309  BP: 122/76  Pulse: 68  Weight: 202 lb (91.6 kg)  Height: 5' 9.5" (1.765 m)   Body mass index is 29.4 kg/m. Wt Readings from Last 3 Encounters:  07/12/23 202 lb (91.6 kg)  06/26/23 208 lb 12.8 oz (94.7 kg)  05/09/23 221 lb (100.2 kg)   Patient is in no distress; well developed, nourished and groomed; neck is supple  MUSCULOSKELETAL: Gait, strength, tone, movements noted in Neurologic exam below  NEUROLOGIC: MENTAL STATUS:      No data to display         awake, alert, oriented to person, place and time recent and remote memory intact normal attention and concentration language fluent, comprehension intact, naming intact fund of knowledge appropriate  CRANIAL NERVE:  2nd, 3rd, 4th, 6th - Visual fields full to confrontation, extraocular muscles intact, no nystagmus 5th - facial sensation symmetric 7th - facial strength symmetric 8th - hearing intact 9th - palate elevates symmetrically, uvula midline 11th - shoulder shrug symmetric 12th - tongue protrusion midline  MOTOR:  normal bulk and tone, full strength in the BUE, BLE  SENSORY:  normal and symmetric to light touch  COORDINATION:  finger-nose-finger, fine finger movements normal  GAIT/STATION:  Veering to the right, difficulty with tandem, positive  Romberg      DIAGNOSTIC DATA (LABS, IMAGING, TESTING) - I reviewed patient records, labs, notes, testing and imaging myself where available.  Lab Results  Component Value Date   WBC 4.1 04/24/2023   HGB 13.8 04/24/2023   HCT 43.1 04/24/2023   MCV 85.9 04/24/2023   PLT 309 04/24/2023      Component Value Date/Time   NA 141 04/24/2023 0511   K 4.3 04/24/2023 0511   CL 98 04/24/2023 0511   CO2 27 04/24/2023 0511   GLUCOSE 92 04/24/2023 0511   BUN 18 04/24/2023 0511   CREATININE 1.03 (H) 04/24/2023 0511   CALCIUM  10.3 04/24/2023 0511   GFRNONAA >60 04/24/2023 0511   Lab Results  Component Value Date   CHOL 201 (H) 04/12/2023   HDL 79 04/12/2023   LDLCALC 113 (H) 04/12/2023   TRIG 46 04/12/2023   CHOLHDL 2.5 04/12/2023   Lab Results  Component Value Date   HGBA1C 5.7 (H) 04/12/2023   No results found for: "VITAMINB12" Lab Results  Component Value Date   TSH 1.100 04/12/2023    MRI Brain 04/12/2023 Small acute infarcts in the right lateral medulla and right cerebellum. Abnormal right vertebral flow void, recommend CTA of the head and neck.  CTA Head and Neck 04/12/2023 1. 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 No clear vasculopathy or atherosclerosis in the neck, correlate for dissection risk factors. 2. 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 Mild atheromatous calcification the cavernous carotids. 3. 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    ASSESSMENT AND PLAN  58 y.o. year old female with no reported past medical history who is presenting after being admitted to the hospital for dizziness, gait abnormality and found to have a right cerebellar strokes and right lateral medullary stroke.  She was also found to have an occluded right vertebral artery.  Patient stroke etiology likely large vessel disease.  She has completed DAPT for 21 days, and currently on aspirin  alone.   Plan will be for patient to continue with aspirin  and Lipitor.  Tells me that her symptoms have improved even though she can be unsteady with walking on occasion, veering to the right, denies any additional fall.  Plan will be for patient to continue current medications, continue with her physical therapy exercises and I have informed her that her symptoms will continue to improve.  Continue to follow with PCP and return as  needed.   1. Cerebellar stroke (HCC)   2. Acute stroke of medulla oblongata (HCC)   3. Cerebrovascular accident (CVA) due to occlusion of right vertebral artery [facility])      Patient Instructions  Continue current medications  Continue to follow up with PCP  Increase exercise  Return as needed   No orders of the defined types were placed in this encounter.   No orders of the defined types were placed in this encounter.   Return if symptoms worsen or fail to improve.  I have spent a total of 50 minutes dedicated to this patient today, preparing to see patient, performing a medically appropriate examination and evaluation, ordering tests and/or medications and procedures, and counseling and educating the patient/family/caregiver; independently interpreting result and communicating results to the family/patient/caregiver; and documenting clinical information in the electronic medical record.   Cassandra Cleveland, MD 07/12/2023, 2:10 PM  [facility] [address] [place], [place] [address] [phone]

## 2023-07-12 NOTE — Patient Instructions (Signed)
 Continue current medications  Continue to follow up with PCP  Increase exercise  Return as needed

## 2023-07-14 ENCOUNTER — Ambulatory Visit: Payer: Medicaid Other

## 2023-07-17 ENCOUNTER — Telehealth: Payer: Self-pay

## 2023-07-17 NOTE — Telephone Encounter (Signed)
 Copied from CRM [id number]. Topic: Clinical - Medical Advice >> Jul 17, 2023  9:01 AM Sasha H wrote: Reason for CRM: Pt is wanting to know what antihistamines won't interfere with her atorvastatin  (LIPITOR) 80 MG tablet. Please give pt a call back at [phone].

## 2023-07-17 NOTE — Telephone Encounter (Signed)
 Patient was informed that it is safe to take allergy medications. Patient verbalized understanding. All questions and concerns have been addressed.

## 2023-07-18 ENCOUNTER — Ambulatory Visit: Payer: Medicaid Other

## 2023-07-21 ENCOUNTER — Ambulatory Visit: Payer: Medicaid Other

## 2023-07-24 ENCOUNTER — Ambulatory Visit: Payer: Medicaid Other | Admitting: Physical Therapy

## 2023-07-28 ENCOUNTER — Ambulatory Visit: Payer: Medicaid Other

## 2023-08-21 ENCOUNTER — Ambulatory Visit: Admitting: Family Medicine

## 2023-08-21 VITALS — BP 113/72 | HR 72 | Temp 98.4°F | Ht 70.0 in | Wt 195.0 lb

## 2023-08-21 DIAGNOSIS — E2839 Other primary ovarian failure: Secondary | ICD-10-CM | POA: Diagnosis not present

## 2023-08-21 DIAGNOSIS — R7401 Elevation of levels of liver transaminase levels: Secondary | ICD-10-CM | POA: Diagnosis not present

## 2023-08-21 DIAGNOSIS — I639 Cerebral infarction, unspecified: Secondary | ICD-10-CM | POA: Diagnosis not present

## 2023-08-21 DIAGNOSIS — R7303 Prediabetes: Secondary | ICD-10-CM | POA: Insufficient documentation

## 2023-08-21 MED ORDER — ASPIRIN 81 MG PO TBEC
81.0000 mg | DELAYED_RELEASE_TABLET | Freq: Every day | ORAL | 3 refills | Status: AC
Start: 2023-08-21 — End: ?

## 2023-08-21 MED ORDER — ATORVASTATIN CALCIUM 80 MG PO TABS: 80.0000 mg | ORAL_TABLET | Freq: Every day | ORAL | 3 refills | Status: DC

## 2023-08-21 NOTE — Assessment & Plan Note (Signed)
 Congratulated patient on lifestyle modifications and to continue the great work.

## 2023-08-21 NOTE — Assessment & Plan Note (Addendum)
 Patient a pleasant 58 year old female who presents today today for follow-up of her chronic medical conditions.  Patient is currently taking atorvastatin  80 mg daily and aspirin  81 mg daily.  Denies adverse side effects. Reports significant lifestyle modifications- including healthy diet (avoiding sugars and processed/fried foods) and daily exercise (30 minutes at least 6 days per week). Encouraged patient to keep up the good work. Will repeat lipid panel and hemoglobin A1c today. Rx sent for atorvastatin  today.

## 2023-08-21 NOTE — Assessment & Plan Note (Signed)
Will check vitamin D levels today.

## 2023-08-21 NOTE — Progress Notes (Signed)
 Established Patient Office Visit  Subjective  Patient ID: Brittney Yu, female    DOB: 07/09/1965  Age: 58 y.o. MRN: [id number]  Chief Complaint  Patient presents with   Transaminitis   CVA   Headache   Brittney Yu is a pleasant 58 year old female patient who presents today for follow-up of her chronic conditions. She is currently taking Lipitor 80mg  daily & aspirin  81mg  daily.   PMHx: admitted in the end of January (01/29) for dizziness, right-sided headache and vomiting and found to have a cerebellar stroke.  During her workup she was found to have an occluded right vertebral artery, cerebellar strokes and right  lateral medullary stroke.  Stroke etiology large vessel disease.  Saw neurology on 4/30- appts PRN   CVA: asa 81mg  & atorvastatin  80mg  daily   Pre-DM: A1c 5.7%, checked on 04/12/2023 Diet: consists of baked chicken or fish, leafy greens, veggies, fruits  Has changed her diet to avoid sodas, snacks, sweets, red meat, really any foods with added sugar  Exercise: 30-60 mins walking 6 days per week and had started light weight lifting   Transaminitis: previous labs with elevated liver enzymes  (elevated alk 148, AST 48, ALT 71)   ROS: see HPI     Objective:      BP 113/72   Pulse 72   Temp 98.4 F (36.9 C) (Oral)   Ht 5\' 10"  (1.778 m)   Wt 195 lb (88.5 kg)   LMP 12/28/2016   BMI 27.98 kg/m  BP Readings from Last 3 Encounters:  08/21/23 113/72  07/12/23 122/76  06/26/23 117/77     Physical Exam Constitutional:      Appearance: Normal appearance.  Cardiovascular:     Rate and Rhythm: Normal rate and regular rhythm.     Pulses: Normal pulses.     Heart sounds: Normal heart sounds.  Pulmonary:     Effort: Pulmonary effort is normal.     Breath sounds: Normal breath sounds.  Neurological:     Mental Status: She is alert.  Psychiatric:        Mood and Affect: Mood normal.        Behavior: Behavior normal.       Assessment & Plan:   Acute CVA  (cerebrovascular accident) [facility]) Assessment & Plan: Patient a pleasant 58 year old female who presents today today for follow-up of her chronic medical conditions.  Patient is currently taking atorvastatin  80 mg daily and aspirin  81 mg daily.  Denies adverse side effects. Reports significant lifestyle modifications- including healthy diet (avoiding sugars and processed/fried foods) and daily exercise (30 minutes at least 6 days per week). Encouraged patient to keep up the good work. Will repeat lipid panel and hemoglobin A1c today. Rx sent for atorvastatin  today.   Orders: -     Hemoglobin A1c -     Lipid panel -     Atorvastatin  Calcium ; Take 1 tablet (80 mg total) by mouth daily.  Dispense: 90 tablet; Refill: 3 -     Aspirin ; Take 1 tablet (81 mg total) by mouth daily. Swallow whole.  Dispense: 90 tablet; Refill: 3  Prediabetes Assessment & Plan: Congratulated patient on lifestyle modifications and to continue the great work.   Orders: -     Hemoglobin A1c -     Lipid panel -     Atorvastatin  Calcium ; Take 1 tablet (80 mg total) by mouth daily.  Dispense: 90 tablet; Refill: 3 -     Aspirin ; Take 1 tablet (  81 mg total) by mouth daily. Swallow whole.  Dispense: 90 tablet; Refill: 3  Transaminitis Assessment & Plan: Will assess CMP to see if liver function has improved from previous results.   Orders: -     Comprehensive metabolic panel with GFR  Estrogen deficiency Assessment & Plan: Will check vitamin D levels today.   Orders: -     VITAMIN D 25 Hydroxy (Vit-D Deficiency, Fractures)     Return in about 7 months (around 03/22/2024) for Physical with fasting labs (vitamin D also) .    Wilhelmena Hanson, FNP

## 2023-08-21 NOTE — Assessment & Plan Note (Signed)
 Will assess CMP to see if liver function has improved from previous results.

## 2023-08-21 NOTE — Patient Instructions (Signed)

## 2023-08-22 ENCOUNTER — Ambulatory Visit: Payer: Self-pay | Admitting: Family Medicine

## 2023-08-22 ENCOUNTER — Encounter: Payer: Self-pay | Admitting: Family Medicine

## 2023-08-22 LAB — VITAMIN D 25 HYDROXY (VIT D DEFICIENCY, FRACTURES): Vit D, 25-Hydroxy: 31.1 ng/mL (ref 30.0–100.0)

## 2023-08-22 LAB — COMPREHENSIVE METABOLIC PANEL WITH GFR
ALT: 70 IU/L — ABNORMAL HIGH (ref 0–32)
AST: 52 IU/L — ABNORMAL HIGH (ref 0–40)
Albumin: 3.9 g/dL (ref 3.8–4.9)
Alkaline Phosphatase: 157 IU/L — ABNORMAL HIGH (ref 44–121)
BUN/Creatinine Ratio: 21 (ref 9–23)
BUN: 18 mg/dL (ref 6–24)
Bilirubin Total: 0.4 mg/dL (ref 0.0–1.2)
CO2: 23 mmol/L (ref 20–29)
Calcium: 9.5 mg/dL (ref 8.7–10.2)
Chloride: 102 mmol/L (ref 96–106)
Creatinine, Ser: 0.87 mg/dL (ref 0.57–1.00)
Globulin, Total: 3 g/dL (ref 1.5–4.5)
Glucose: 86 mg/dL (ref 70–99)
Potassium: 4.8 mmol/L (ref 3.5–5.2)
Sodium: 141 mmol/L (ref 134–144)
Total Protein: 6.9 g/dL (ref 6.0–8.5)
eGFR: 78 mL/min/{1.73_m2} (ref >59)

## 2023-08-22 LAB — LIPID PANEL
Chol/HDL Ratio: 2.5 ratio (ref 0.0–4.4)
Cholesterol, Total: 136 mg/dL (ref 100–199)
HDL: 54 mg/dL (ref >39)
LDL Chol Calc (NIH): 72 mg/dL (ref 0–99)
Triglycerides: 43 mg/dL (ref 0–149)
VLDL Cholesterol Cal: 10 mg/dL (ref 5–40)

## 2023-08-22 LAB — HEMOGLOBIN A1C
Est. average glucose Bld gHb Est-mCnc: 117 mg/dL
Hgb A1c MFr Bld: 5.7 % — ABNORMAL HIGH (ref 4.8–5.6)

## 2023-08-24 LAB — HCV AB W REFLEX TO QUANT PCR: HCV Ab: NONREACTIVE

## 2023-08-24 LAB — HCV INTERPRETATION

## 2023-08-24 LAB — HEPATITIS B SURFACE ANTIBODY, QUANTITATIVE: Hepatitis B Surf Ab Quant: <3.5 m[IU]/mL — ABNORMAL LOW

## 2023-08-24 LAB — SPECIMEN STATUS REPORT

## 2023-09-05 ENCOUNTER — Encounter: Payer: Self-pay | Admitting: Family Medicine

## 2023-09-06 ENCOUNTER — Ambulatory Visit: Admitting: Family Medicine

## 2023-09-06 ENCOUNTER — Ambulatory Visit: Payer: Self-pay

## 2023-09-06 VITALS — BP 122/76 | HR 76 | Temp 98.0°F | Wt 189.0 lb

## 2023-09-06 DIAGNOSIS — R110 Nausea: Secondary | ICD-10-CM | POA: Diagnosis not present

## 2023-09-06 DIAGNOSIS — R7401 Elevation of levels of liver transaminase levels: Secondary | ICD-10-CM | POA: Diagnosis not present

## 2023-09-06 DIAGNOSIS — R1084 Generalized abdominal pain: Secondary | ICD-10-CM | POA: Diagnosis not present

## 2023-09-06 DIAGNOSIS — R197 Diarrhea, unspecified: Secondary | ICD-10-CM | POA: Diagnosis not present

## 2023-09-06 LAB — POCT INFLUENZA A/B
Influenza A, POC: NEGATIVE
Influenza B, POC: NEGATIVE

## 2023-09-06 NOTE — Patient Instructions (Addendum)
 The BRAT diet is a bland-food diet historically recommended for people recovering from gastrointestinal upset, especially vomiting, diarrhea, or gastroenteritis.  BRAT stands for: * Bananas * Rice (white rice) * Applesauce * Toast (plain, white bread)   Why use the BRAT diet? These foods are: * Low in fiber ? less irritating to the GI tract * Easy to digest * Help firm up stools * Gentle on an upset stomach  Hydration first (oral rehydration solutions like Pedialyte or electrolyte drinks) Resume a normal, well-balanced diet as soon as tolerated, including: * Lean meats * Yogurt * Vegetables * Whole grains

## 2023-09-06 NOTE — Telephone Encounter (Signed)
 FYI Only or Action Required?: Action required by provider: request for appointment.  Patient was last seen in primary care on 08/21/2023 by Towana Small, FNP. Called Nurse Triage reporting Abdominal Pain. Symptoms began several days ago. Interventions attempted: Rest, hydration, or home remedies. Symptoms are: unchanged. Sunday at church started having abdominal pain, diarrhea. Symptoms continue.  Triage Disposition: See HCP Within 4 Hours (Or PCP Triage)  Patient/caregiver understands and will follow disposition?: YesCopied from CRM [id number]. Topic: Clinical - Red Word Triage >> Sep 06, 2023  8:54 AM Ivette P wrote: Red Word that prompted transfer to Nurse Triage:  feeling nausea, diarrhea Answer Assessment - Initial Assessment Questions 1. LOCATION: Where does it hurt?      All over 2. RADIATION: Does the pain shoot anywhere else? (e.g., chest, back)     no 3. ONSET: When did the pain begin? (e.g., minutes, hours or days ago)      Sunday 4. SUDDEN: Gradual or sudden onset?     sudden 5. PATTERN Does the pain come and go, or is it constant?    - If it comes and goes: How long does it last? Do you have pain now?     (Note: Comes and goes means the pain is intermittent. It goes away completely between bouts.)    - If constant: Is it getting better, staying the same, or getting worse?      (Note: Constant means the pain never goes away completely; most serious pain is constant and gets worse.)      Comes and goes 6. SEVERITY: How bad is the pain?  (e.g., Scale 1-10; mild, moderate, or severe)    - MILD (1-3): Doesn't interfere with normal activities, abdomen soft and not tender to touch.     - MODERATE (4-7): Interferes with normal activities or awakens from sleep, abdomen tender to touch.     - SEVERE (8-10): Excruciating pain, doubled over, unable to do any normal activities.       7-8 7. RECURRENT SYMPTOM: Have you ever had this type of stomach pain before? If Yes,  ask: When was the last time? and What happened that time?      no 8. CAUSE: What do you think is causing the stomach pain?     unsure 9. RELIEVING/AGGRAVATING FACTORS: What makes it better or worse? (e.g., antacids, bending or twisting motion, bowel movement)     no 10. OTHER SYMPTOMS: Do you have any other symptoms? (e.g., back pain, diarrhea, fever, urination pain, vomiting)       Fatigue, watery, nausea  11. PREGNANCY: Is there any chance you are pregnant? When was your last menstrual period?       no  Protocols used: Abdominal Pain - Female-[facility]  Reason for Disposition  [1] MILD-MODERATE pain AND [2] constant AND [3] present > 2 hours  Answer Assessment - Initial Assessment Questions 1. LOCATION: Where does it hurt?      All over 2. RADIATION: Does the pain shoot anywhere else? (e.g., chest, back)     no 3. ONSET: When did the pain begin? (e.g., minutes, hours or days ago)      Sunday 4. SUDDEN: Gradual or sudden onset?     sudden 5. PATTERN Does the pain come and go, or is it constant?    - If it comes and goes: How long does it last? Do you have pain now?     (Note: Comes and goes means the pain is intermittent. It goes  away completely between bouts.)    - If constant: Is it getting better, staying the same, or getting worse?      (Note: Constant means the pain never goes away completely; most serious pain is constant and gets worse.)      Comes and goes 6. SEVERITY: How bad is the pain?  (e.g., Scale 1-10; mild, moderate, or severe)    - MILD (1-3): Doesn't interfere with normal activities, abdomen soft and not tender to touch.     - MODERATE (4-7): Interferes with normal activities or awakens from sleep, abdomen tender to touch.     - SEVERE (8-10): Excruciating pain, doubled over, unable to do any normal activities.       7-8 7. RECURRENT SYMPTOM: Have you ever had this type of stomach pain before? If Yes, ask: When was the last time?  and What happened that time?      no 8. CAUSE: What do you think is causing the stomach pain?     unsure 9. RELIEVING/AGGRAVATING FACTORS: What makes it better or worse? (e.g., antacids, bending or twisting motion, bowel movement)     no 10. OTHER SYMPTOMS: Do you have any other symptoms? (e.g., back pain, diarrhea, fever, urination pain, vomiting)       Fatigue, watery, nausea  11. PREGNANCY: Is there any chance you are pregnant? When was your last menstrual period?       no  Protocols used: Abdominal Pain - [facility]

## 2023-09-06 NOTE — Progress Notes (Signed)
 Acute Care Office Visit  Subjective:   Brittney Yu 1965-10-04 09/06/2023  Chief Complaint  Patient presents with   Acute Visit   Nausea   HPI: Presents today for an acute visit with complaint of abdominal discomfort and nausea.  Symptoms have been present since Sunday late morning.  Associated symptoms include: fatigue/no energy, lack of appetite, stomach hurts with food, and diarrhea.  Able to stay hydrated.  Pertinent negatives: fever/chills, myalgias, emesis.  Treatments tried include : none Treatment effective : N/A Sick contacts : no  The following portions of the patient's history were reviewed and updated as appropriate: past medical history, past surgical history, family history, social history, allergies, medications, and problem list.   Patient Active Problem List   Diagnosis Date Noted   Prediabetes 08/21/2023   Transaminitis 08/21/2023   Estrogen deficiency 08/21/2023   Coping style affecting medical condition 04/24/2023   Acute CVA (cerebrovascular accident) (HCC) 04/12/2023   Rotator cuff impingement syndrome, right 10/12/2022   Past Medical History:  Diagnosis Date   Elevated cholesterol    Past Surgical History:  Procedure Laterality Date   SHOULDER SURGERY Left    Family History  Problem Relation Age of Onset   Hypertension Mother    Breast cancer Father    CVA Maternal Grandmother    Transient ischemic attack Maternal Aunt    Outpatient Medications Prior to Visit  Medication Sig Dispense Refill   aspirin  EC 81 MG tablet Take 1 tablet (81 mg total) by mouth daily. Swallow whole. 90 tablet 3   atorvastatin  (LIPITOR) 80 MG tablet Take 1 tablet (80 mg total) by mouth daily. 90 tablet 3   polyethylene glycol (MIRALAX  / GLYCOLAX ) 17 g packet Take 17 g by mouth 2 (two) times daily.     No facility-administered medications prior to visit.   No Known Allergies  ROS: A complete ROS was performed with pertinent positives/negatives noted in  the HPI. The remainder of the ROS are negative.    Objective:   Today's Vitals   09/06/23 1103  BP: 122/76  Pulse: 76  Temp: 98 F (36.7 C)  TempSrc: Oral  Weight: 189 lb (85.7 kg)    Physical Exam Vitals reviewed.  Constitutional:      Appearance: Normal appearance.   Cardiovascular:     Rate and Rhythm: Normal rate and regular rhythm.     Pulses: Normal pulses.     Heart sounds: Normal heart sounds.  Pulmonary:     Effort: Pulmonary effort is normal.     Breath sounds: Normal breath sounds.  Abdominal:     General: Abdomen is flat. Bowel sounds are normal. There is no distension.     Palpations: Abdomen is soft. There is no hepatomegaly, splenomegaly or mass.     Tenderness: There is no abdominal tenderness. There is no guarding or rebound.     Hernia: No hernia is present.   Neurological:     Mental Status: She is alert.   Psychiatric:        Mood and Affect: Mood normal.        Behavior: Behavior normal.    Results for orders placed or performed in visit on 09/06/23  POCT Influenza A/B  Result Value Ref Range   Influenza A, POC Negative Negative   Influenza B, POC Negative Negative      Assessment & Plan:   1. Generalized abdominal pain (Primary) Patient is a pleasant 58 year old female patient who presents  today for an acute visit with concerns of abdominal pain, nausea and diarrhea. She is  well-appearing and in no acute distress. Denies lightheadedness/dizziness, unintentional weight loss, change in bowel habits, fever/chills, and urinary symptoms. Vital signs stable- afebrile, normotensive with regular heart rate. Physical exam benign with no acute red flags or concerns. Normoactive bowel sounds. Abdomen soft without tenderness, guarding, masses or organomegaly. POCT flu test negative today. Discussed that this could be due to viral gastroenteritis. Discussed importance of staying hydrated and following BRAT diet. Patient is agreeable to plan of care and will  return to office if symptoms persist or worsen.  - POCT Influenza A/B  2. Nausea Discussed use of OTC Pepto Bismol for symptom relief.  - POCT Influenza A/B  3. Diarrhea of presumed infectious origin Discussed use of OTC Pepto Bismol for symptom relief.   Return in 2 weeks (on 09/20/2023), or if symptoms worsen or fail to improve, for lab visit & hep B vaccine .    Patient to reach out to office if new, worrisome, or unresolved symptoms arise or if no improvement in patient's condition. Patient verbalized understanding and is agreeable to treatment plan. All questions answered to patient's satisfaction.    [name], FNP

## 2023-10-04 ENCOUNTER — Ambulatory Visit: Admitting: Family Medicine

## 2023-10-04 ENCOUNTER — Ambulatory Visit (INDEPENDENT_AMBULATORY_CARE_PROVIDER_SITE_OTHER)

## 2023-10-04 ENCOUNTER — Other Ambulatory Visit: Payer: Self-pay

## 2023-10-04 DIAGNOSIS — R7401 Elevation of levels of liver transaminase levels: Secondary | ICD-10-CM

## 2023-10-04 DIAGNOSIS — Z23 Encounter for immunization: Secondary | ICD-10-CM

## 2023-10-05 LAB — HEPATIC FUNCTION PANEL
ALT: 137 IU/L — ABNORMAL HIGH (ref 0–32)
AST: 93 IU/L — ABNORMAL HIGH (ref 0–40)
Albumin: 4 g/dL (ref 3.8–4.9)
Alkaline Phosphatase: 184 IU/L — ABNORMAL HIGH (ref 44–121)
Bilirubin Total: 0.3 mg/dL (ref 0.0–1.2)
Bilirubin, Direct: 0.11 mg/dL (ref 0.00–0.40)
Total Protein: 6.9 g/dL (ref 6.0–8.5)

## 2023-10-12 ENCOUNTER — Encounter: Payer: Self-pay | Admitting: Family Medicine

## 2023-10-16 ENCOUNTER — Ambulatory Visit: Payer: Self-pay | Admitting: Family Medicine

## 2023-10-16 DIAGNOSIS — R748 Abnormal levels of other serum enzymes: Secondary | ICD-10-CM

## 2023-10-18 ENCOUNTER — Ambulatory Visit
Admission: RE | Admit: 2023-10-18 | Discharge: 2023-10-18 | Disposition: A | Discharge status: Home / Self Care | Source: Ambulatory Visit | Attending: Family Medicine | Admitting: Family Medicine

## 2023-10-18 DIAGNOSIS — R748 Abnormal levels of other serum enzymes: Secondary | ICD-10-CM

## 2023-10-25 ENCOUNTER — Ambulatory Visit: Payer: Self-pay | Admitting: Family Medicine

## 2023-10-25 ENCOUNTER — Other Ambulatory Visit: Payer: Self-pay | Admitting: Family Medicine

## 2023-10-25 DIAGNOSIS — R7401 Elevation of levels of liver transaminase levels: Secondary | ICD-10-CM

## 2023-10-25 DIAGNOSIS — I639 Cerebral infarction, unspecified: Secondary | ICD-10-CM

## 2023-10-26 ENCOUNTER — Other Ambulatory Visit: Payer: Self-pay | Admitting: Family Medicine

## 2023-10-26 DIAGNOSIS — R7401 Elevation of levels of liver transaminase levels: Secondary | ICD-10-CM

## 2023-10-30 NOTE — Telephone Encounter (Signed)
 Copied from CRM #[id number]. Topic: Referral - Question >> Oct 30, 2023  1:36 PM Montie POUR wrote: Reason for CRM:  Can FNP Towana refer Brittney Yu to [facility] at [facility], [address], [place], [place] [address] Phone: [phone] This is close to her home. Please send her a message through MyChart when complete.

## 2023-11-01 ENCOUNTER — Encounter (HOSPITAL_BASED_OUTPATIENT_CLINIC_OR_DEPARTMENT_OTHER): Payer: Self-pay

## 2023-11-01 ENCOUNTER — Other Ambulatory Visit: Payer: Self-pay | Admitting: Family Medicine

## 2023-11-01 DIAGNOSIS — R7401 Elevation of levels of liver transaminase levels: Secondary | ICD-10-CM

## 2023-11-14 ENCOUNTER — Telehealth: Payer: Self-pay

## 2023-11-14 NOTE — Telephone Encounter (Signed)
 Faxed referral again to [facility].

## 2023-12-04 ENCOUNTER — Telehealth: Payer: Self-pay | Admitting: Family Medicine

## 2023-12-04 NOTE — Telephone Encounter (Signed)
 Patient walked in to have labs drawn and was unable to get the labs drawn because there were no orders.  Message on patient's mychart  (phone) was dated Aug. 15, 225 for her to come in for labs.

## 2023-12-17 ENCOUNTER — Other Ambulatory Visit: Payer: Self-pay | Admitting: Family Medicine

## 2023-12-17 DIAGNOSIS — R7401 Elevation of levels of liver transaminase levels: Secondary | ICD-10-CM

## 2023-12-17 DIAGNOSIS — R7303 Prediabetes: Secondary | ICD-10-CM

## 2023-12-17 DIAGNOSIS — I639 Cerebral infarction, unspecified: Secondary | ICD-10-CM

## 2023-12-19 ENCOUNTER — Ambulatory Visit (INDEPENDENT_AMBULATORY_CARE_PROVIDER_SITE_OTHER)

## 2023-12-19 DIAGNOSIS — I639 Cerebral infarction, unspecified: Secondary | ICD-10-CM

## 2023-12-19 DIAGNOSIS — R7303 Prediabetes: Secondary | ICD-10-CM

## 2023-12-19 DIAGNOSIS — R7401 Elevation of levels of liver transaminase levels: Secondary | ICD-10-CM

## 2023-12-19 DIAGNOSIS — Z23 Encounter for immunization: Secondary | ICD-10-CM

## 2023-12-20 LAB — LIPID PANEL
Chol/HDL Ratio: 2 ratio (ref 0.0–4.4)
Cholesterol, Total: 129 mg/dL (ref 100–199)
HDL: 64 mg/dL (ref >39)
LDL Chol Calc (NIH): 56 mg/dL (ref 0–99)
Triglycerides: 36 mg/dL (ref 0–149)
VLDL Cholesterol Cal: 9 mg/dL (ref 5–40)

## 2023-12-20 LAB — COMPREHENSIVE METABOLIC PANEL WITH GFR
ALT: 264 IU/L — ABNORMAL HIGH (ref 0–32)
AST: 141 IU/L — ABNORMAL HIGH (ref 0–40)
Albumin: 4 g/dL (ref 3.8–4.9)
Alkaline Phosphatase: 204 IU/L — ABNORMAL HIGH (ref 49–135)
BUN/Creatinine Ratio: 27 — ABNORMAL HIGH (ref 9–23)
BUN: 26 mg/dL — ABNORMAL HIGH (ref 6–24)
Bilirubin Total: 0.4 mg/dL (ref 0.0–1.2)
CO2: 25 mmol/L (ref 20–29)
Calcium: 9.7 mg/dL (ref 8.7–10.2)
Chloride: 103 mmol/L (ref 96–106)
Creatinine, Ser: 0.96 mg/dL (ref 0.57–1.00)
Globulin, Total: 2.7 g/dL (ref 1.5–4.5)
Glucose: 92 mg/dL (ref 70–99)
Potassium: 5.3 mmol/L — ABNORMAL HIGH (ref 3.5–5.2)
Sodium: 139 mmol/L (ref 134–144)
Total Protein: 6.7 g/dL (ref 6.0–8.5)
eGFR: 69 mL/min/1.73 (ref >59)

## 2023-12-21 ENCOUNTER — Other Ambulatory Visit: Payer: Self-pay | Admitting: Family Medicine

## 2023-12-21 ENCOUNTER — Telehealth: Payer: Self-pay | Admitting: Family Medicine

## 2023-12-21 DIAGNOSIS — R7401 Elevation of levels of liver transaminase levels: Secondary | ICD-10-CM

## 2023-12-21 DIAGNOSIS — I639 Cerebral infarction, unspecified: Secondary | ICD-10-CM

## 2023-12-21 NOTE — Telephone Encounter (Signed)
 Spoke with patient about concern for increasing liver enzymes. Review recent labs for lipid panel and hepatic function. Discussed stopping 80mg  daily of Lipitor for 4 weeks and repeating liver enzymes. Patient reports she would feel more comfortable decreasing her dose, despite more than adequate cholesterol control. Advised patient to decrease Lipitor to 40mg  daily and will repeat hepatic function in 4 weeks.   [name], FNP-C [facility]  [address], [place], [place] [address] [phone]

## 2024-03-18 ENCOUNTER — Telehealth: Payer: Self-pay | Admitting: Family Medicine

## 2024-03-18 NOTE — Telephone Encounter (Signed)
 Copied from CRM [id number]. Topic: General - Billing Inquiry >> Mar 15, 2024  1:27 PM Avram MATSU wrote: Reason for CRM: patient lost her insurance and her visit on the 9th will be self pay, please advise and discuss the cost with her. Was informed about labs being billed separate. [phone]

## 2024-03-22 ENCOUNTER — Ambulatory Visit (INDEPENDENT_AMBULATORY_CARE_PROVIDER_SITE_OTHER): Admitting: Family Medicine

## 2024-03-22 ENCOUNTER — Ambulatory Visit: Admitting: Family Medicine

## 2024-03-22 DIAGNOSIS — Z23 Encounter for immunization: Secondary | ICD-10-CM | POA: Diagnosis not present

## 2024-03-22 NOTE — Progress Notes (Signed)
 Patient is in office today for a nurse visit for Hep B immunization. Patient Injection was given in the  Left deltoid. Patient tolerated injection well.

## 2024-04-01 ENCOUNTER — Ambulatory Visit
Admission: RE | Admit: 2024-04-01 | Discharge: 2024-04-01 | Disposition: A | Discharge status: Home / Self Care | Payer: Self-pay | Source: Ambulatory Visit

## 2024-04-01 ENCOUNTER — Ambulatory Visit

## 2024-04-01 VITALS — BP 115/70 | HR 68 | Temp 98.1°F | Resp 15 | Wt 178.0 lb

## 2024-04-01 DIAGNOSIS — J069 Acute upper respiratory infection, unspecified: Secondary | ICD-10-CM

## 2024-04-01 DIAGNOSIS — H6993 Unspecified Eustachian tube disorder, bilateral: Secondary | ICD-10-CM

## 2024-04-01 MED ORDER — IPRATROPIUM BROMIDE 0.06 % NA SOLN: 2.0000 | Freq: Four times a day (QID) | NASAL | 12 refills | Status: AC

## 2024-04-01 MED ORDER — AMOXICILLIN-POT CLAVULANATE 875-125 MG PO TABS: 1.0000 | ORAL_TABLET | Freq: Two times a day (BID) | ORAL | 0 refills | Status: AC

## 2024-04-01 NOTE — ED Triage Notes (Signed)
 Pt c/o head congestion and headache off and on since after christmas. In the past few days she has left ear pain. When she puts cotton in her ear it relieves the pain. She has used nasal spray to help with her symptoms.

## 2024-04-01 NOTE — ED Provider Notes (Signed)
 " [facility]    CSN: [id number] Arrival date & time: 04/01/24  1201      History   Chief Complaint Chief Complaint  Patient presents with   Otalgia   Headache    HPI Brittney Yu is a 59 y.o. female.   HPI  59 year old female with past medical history significant for CVA, prediabetes, estrogen deficiency, and transaminitis presents for evaluation of off-and-on headache and nasal congestion since Christmas with the development of pain in the left ear several days ago.  No fever, changes to hearing, or drainage from the ear.  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      Home Medications    Prior to Admission medications  Medication Sig Start Date End Date Taking? Authorizing Provider  amoxicillin -clavulanate (AUGMENTIN ) 875-125 MG tablet Take 1 tablet by mouth every 12 (twelve) hours for 7 days. 04/01/24 04/08/24 Yes Bernardino Ditch, NP  ipratropium (ATROVENT ) 0.06 % nasal spray Place 2 sprays into both nostrils 4 (four) times daily. 04/01/24  Yes Bernardino Ditch, NP  rosuvastatin (CRESTOR) 10 MG tablet ROSUVASTATIN CALCIUM  10 MG TABS 03/28/24  Yes [provider]  aspirin  EC 81 MG tablet Take 1 tablet (81 mg total) by mouth daily. Swallow whole. 08/21/23   Butler, Kristina, FNP  polyethylene glycol (MIRALAX  / GLYCOLAX ) 17 g packet Take 17 g by mouth 2 (two) times daily.    [provider]    Family History Family History  Problem Relation Age of Onset   Hypertension Mother    Breast cancer  Father    CVA Maternal Grandmother    Transient ischemic attack Maternal Aunt     Social History Social History[1]   Allergies   Patient has no known allergies.   Review of Systems Review of Systems  Constitutional:  Negative for fever.  HENT:  Positive for congestion, ear pain and rhinorrhea. Negative for ear discharge and hearing loss.   Neurological:  Positive for headaches.     Physical Exam Triage Vital Signs ED Triage Vitals  Encounter Vitals Group     BP      Girls Systolic BP Percentile      Girls Diastolic BP Percentile      Boys Systolic BP Percentile      Boys Diastolic BP Percentile      Pulse      Resp      Temp      Temp src      SpO2      Weight      Height      Head Circumference      Peak Flow      Pain Score      Pain Loc      Pain Education      Exclude from Growth Chart    No data found.  Updated Vital Signs BP 115/70 (BP Location: Right Arm)   Pulse 68   Temp 98.1 F (36.7 C) (Oral)   Resp 15   Wt 178  lb (80.7 kg)   LMP 12/28/2016   SpO2 98%   BMI 25.54 kg/m   Visual Acuity Right Eye Distance:   Left Eye Distance:   Bilateral Distance:    Right Eye Near:   Left Eye Near:    Bilateral Near:     Physical Exam Vitals and nursing note reviewed.  Constitutional:      Appearance: Normal appearance. She is not ill-appearing.  HENT:     Head: Normocephalic and atraumatic.     Right Ear: Tympanic membrane, ear canal and external ear normal. There is no impacted cerumen.     Left Ear: Tympanic membrane, ear canal and external ear normal. There is no impacted cerumen.  Skin:    General: Skin is warm and dry.     Capillary Refill: Capillary refill takes less than 2 seconds.     Findings: No erythema or rash.  Neurological:     General: No focal deficit present.     Mental Status: She is alert and oriented to person, place, and time.      UC Treatments / Results  Labs (all labs ordered are listed, but only abnormal  results are displayed) Labs Reviewed - No data to display  EKG   Radiology No results found.  Procedures Procedures (including critical care time)  Medications Ordered in UC Medications - No data to display  Initial Impression / Assessment and Plan / UC Course  I have reviewed the triage vital signs and the nursing notes.  Pertinent labs & imaging results that were available during my care of the patient were reviewed by me and considered in my medical decision making (see chart for details).   59 year old female with no significant past medical history presents for evaluation of several days worth of left ear pain without associated drainage or changes to her hearing.  On exam, patient has pearly gray tympanic membrane's bilaterally with a normal light reflex.  There is mild cerumen in both external auditory canals but no occlusion.  No tenderness with external palpation of bilateral eustachian tubes.  I did have the patient attempt to clear her eustachian tubes using the Valsalva maneuver and she was unable to.  I suspect that she has some degree of eustachian tube dysfunction.  Given that she has been experiencing this intermittent nasal congestion for the past 4 weeks I do feel a trial of antibiotics is warranted.  I will have her continue using the Flonase  that she has been using at home as well as periodically perform the Valsalva maneuver.  I will start her on Augmentin  875 twice daily for 7 days for treatment of URI.  Also Atrovent  nasal spray to help with the congestion.  Return precautions reviewed.   Final Clinical Impressions(s) / UC Diagnoses   Final diagnoses:  Upper respiratory tract infection, unspecified type  Eustachian tube dysfunction, bilateral     Discharge Instructions      Take the Augmentin  twice daily with food for 7 days for treatment of your URI.  Continue to use your Flonase  at bedtime, 2 squirts up each nostril.  Use the Atrovent  nasal spray, 2  squirts up each nostril every 6 hours as needed for nasal congestion.  Periodically throughout the day continue to try and pop your ears as shown in the exam room.  If you develop any new or worsening symptoms please return for reevaluation or see your primary care provider.     ED Prescriptions     Medication  Sig Dispense Auth. Provider   amoxicillin -clavulanate (AUGMENTIN ) 875-125 MG tablet Take 1 tablet by mouth every 12 (twelve) hours for 7 days. 14 tablet Bernardino Ditch, NP   ipratropium (ATROVENT ) 0.06 % nasal spray Place 2 sprays into both nostrils 4 (four) times daily. 15 mL Bernardino Ditch, NP      PDMP not reviewed this encounter.    [1]  Social History Tobacco Use   Smoking status: Never   Smokeless tobacco: Never  Vaping Use   Vaping status: Never Used  Substance Use Topics   Alcohol use: No   Drug use: No     Bernardino Ditch, NP 04/01/24 1238  "

## 2024-04-01 NOTE — Discharge Instructions (Addendum)
 Take the Augmentin  twice daily with food for 7 days for treatment of your URI.  Continue to use your Flonase  at bedtime, 2 squirts up each nostril.  Use the Atrovent  nasal spray, 2 squirts up each nostril every 6 hours as needed for nasal congestion.  Periodically throughout the day continue to try and pop your ears as shown in the exam room.  If you develop any new or worsening symptoms please return for reevaluation or see your primary care provider.

## 2024-09-17 ENCOUNTER — Encounter: Admitting: Family Medicine
